# Patient Record
Sex: Female | Born: 1995 | Race: White | Hispanic: No | Marital: Married | Smoking: Former smoker
Health system: Southern US, Community
[De-identification: ages and names within clinical notes are randomized; demographics above are authoritative.]

## PROBLEM LIST (undated history)

## (undated) ENCOUNTER — Inpatient Hospital Stay (HOSPITAL_COMMUNITY): Payer: Self-pay

## (undated) DIAGNOSIS — F419 Anxiety disorder, unspecified: Secondary | ICD-10-CM

## (undated) HISTORY — PX: HYMENECTOMY: SHX987

---
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
## 2015-08-11 DIAGNOSIS — F419 Anxiety disorder, unspecified: Secondary | ICD-10-CM

## 2015-08-11 HISTORY — DX: Anxiety disorder, unspecified: F41.9

## 2017-05-06 ENCOUNTER — Ambulatory Visit (HOSPITAL_COMMUNITY): Payer: Self-pay | Admitting: Psychiatry
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
## 2017-05-17 ENCOUNTER — Ambulatory Visit (INDEPENDENT_AMBULATORY_CARE_PROVIDER_SITE_OTHER): Payer: Self-pay | Admitting: Psychiatry

## 2017-05-17 ENCOUNTER — Encounter (HOSPITAL_COMMUNITY): Payer: Self-pay | Admitting: Psychiatry

## 2017-05-17 ENCOUNTER — Encounter (INDEPENDENT_AMBULATORY_CARE_PROVIDER_SITE_OTHER): Payer: Self-pay

## 2017-05-17 VITALS — BP 125/71 | HR 100 | Ht 63.0 in | Wt 121.0 lb

## 2017-05-17 DIAGNOSIS — F419 Anxiety disorder, unspecified: Secondary | ICD-10-CM

## 2017-05-17 DIAGNOSIS — G4700 Insomnia, unspecified: Secondary | ICD-10-CM

## 2017-05-17 DIAGNOSIS — F411 Generalized anxiety disorder: Secondary | ICD-10-CM | POA: Insufficient documentation

## 2017-05-17 DIAGNOSIS — Z91410 Personal history of adult physical and sexual abuse: Secondary | ICD-10-CM

## 2017-05-17 DIAGNOSIS — Z818 Family history of other mental and behavioral disorders: Secondary | ICD-10-CM

## 2017-05-17 DIAGNOSIS — Z87891 Personal history of nicotine dependence: Secondary | ICD-10-CM

## 2017-05-17 DIAGNOSIS — F4310 Post-traumatic stress disorder, unspecified: Secondary | ICD-10-CM

## 2017-05-17 DIAGNOSIS — Z91411 Personal history of adult psychological abuse: Secondary | ICD-10-CM

## 2017-05-17 MED ORDER — LORAZEPAM 0.5 MG PO TABS: 0.5000 mg | ORAL_TABLET | Freq: Every day | ORAL | 0 refills | Status: DC | PRN

## 2017-05-17 MED ORDER — FLUOXETINE HCL 60 MG PO TABS: ORAL_TABLET | ORAL | 1 refills | Status: DC

## 2017-05-17 NOTE — Patient Instructions (Signed)
1. Discontinue sertraline, buspar 2. Start fluoxetine 30 mg daily for two weeks, then 60 mg daily  3. Return to clinic in one month for 30 mins 4. Referral to therapy

## 2017-06-03 ENCOUNTER — Ambulatory Visit (HOSPITAL_COMMUNITY): Payer: Self-pay | Admitting: Licensed Clinical Social Worker
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
## 2017-06-17 ENCOUNTER — Ambulatory Visit (HOSPITAL_COMMUNITY): Payer: Self-pay | Admitting: Psychiatry

## 2017-06-18 ENCOUNTER — Ambulatory Visit (INDEPENDENT_AMBULATORY_CARE_PROVIDER_SITE_OTHER): Payer: Self-pay | Admitting: Psychiatry

## 2017-06-18 ENCOUNTER — Encounter (HOSPITAL_COMMUNITY): Payer: Self-pay | Admitting: Psychiatry

## 2017-06-18 VITALS — BP 101/72 | HR 98 | Ht 63.0 in | Wt 116.0 lb

## 2017-06-18 DIAGNOSIS — Z87891 Personal history of nicotine dependence: Secondary | ICD-10-CM

## 2017-06-18 DIAGNOSIS — F4310 Post-traumatic stress disorder, unspecified: Secondary | ICD-10-CM

## 2017-06-18 MED ORDER — FLUOXETINE HCL 40 MG PO CAPS: 40.0000 mg | ORAL_CAPSULE | Freq: Every day | ORAL | 0 refills | Status: DC

## 2017-06-18 MED ORDER — CLONAZEPAM 0.5 MG PO TABS: 0.5000 mg | ORAL_TABLET | Freq: Every day | ORAL | 0 refills | Status: DC | PRN

## 2017-06-18 NOTE — Patient Instructions (Signed)
1. Increase fluoxetine 40 mg daily  2. Discontinue ativan  3. Start clonazepam 0.5 mg daily as needed for anxiety  4. Return to clinic in one month for 30 mins 5. Referral to therapy
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
## 2017-07-19 ENCOUNTER — Ambulatory Visit (HOSPITAL_COMMUNITY): Payer: Self-pay | Admitting: Psychiatry

## 2017-07-20 ENCOUNTER — Telehealth (HOSPITAL_COMMUNITY): Payer: Self-pay | Admitting: *Deleted

## 2017-07-20 NOTE — Telephone Encounter (Signed)
left voice message regarding appointment missed due to weather. 

## 2018-08-25 LAB — CHG URINE PREGNANCY TEST: PREG TEST UR: POSITIVE

## 2018-09-19 DIAGNOSIS — Z3A09 9 weeks gestation of pregnancy: Secondary | ICD-10-CM | POA: Diagnosis not present

## 2018-09-19 DIAGNOSIS — O219 Vomiting of pregnancy, unspecified: Secondary | ICD-10-CM | POA: Diagnosis not present

## 2018-09-19 DIAGNOSIS — E860 Dehydration: Secondary | ICD-10-CM | POA: Diagnosis not present

## 2018-09-19 DIAGNOSIS — O2391 Unspecified genitourinary tract infection in pregnancy, first trimester: Secondary | ICD-10-CM | POA: Diagnosis not present

## 2018-09-21 DIAGNOSIS — Z3481 Encounter for supervision of other normal pregnancy, first trimester: Secondary | ICD-10-CM | POA: Diagnosis not present

## 2018-09-30 ENCOUNTER — Telehealth: Payer: Self-pay | Admitting: Family Medicine

## 2018-09-30 NOTE — Telephone Encounter (Signed)
Attempted to call patient to get her rescheduled for her missed new ob intake on 2/21. No answer and could not leave a VM due to number not being in service.

## 2018-10-20 ENCOUNTER — Encounter: Payer: Self-pay | Admitting: *Deleted

## 2018-10-31 ENCOUNTER — Other Ambulatory Visit: Payer: Self-pay

## 2018-10-31 ENCOUNTER — Encounter: Payer: Self-pay | Admitting: *Deleted

## 2018-10-31 ENCOUNTER — Encounter (HOSPITAL_COMMUNITY): Payer: Self-pay | Admitting: *Deleted

## 2018-10-31 ENCOUNTER — Inpatient Hospital Stay (HOSPITAL_COMMUNITY): Payer: Medicaid Other

## 2018-10-31 ENCOUNTER — Inpatient Hospital Stay (HOSPITAL_COMMUNITY)
Admission: EM | Admit: 2018-10-31 | Discharge: 2018-10-31 | Disposition: A | Discharge status: Home / Self Care | Payer: Medicaid Other | Attending: Obstetrics and Gynecology | Admitting: Obstetrics and Gynecology

## 2018-10-31 DIAGNOSIS — Z79899 Other long term (current) drug therapy: Secondary | ICD-10-CM | POA: Insufficient documentation

## 2018-10-31 DIAGNOSIS — N83201 Unspecified ovarian cyst, right side: Secondary | ICD-10-CM | POA: Insufficient documentation

## 2018-10-31 DIAGNOSIS — O3481 Maternal care for other abnormalities of pelvic organs, first trimester: Secondary | ICD-10-CM | POA: Diagnosis not present

## 2018-10-31 DIAGNOSIS — O99341 Other mental disorders complicating pregnancy, first trimester: Secondary | ICD-10-CM | POA: Diagnosis not present

## 2018-10-31 DIAGNOSIS — F419 Anxiety disorder, unspecified: Secondary | ICD-10-CM | POA: Diagnosis not present

## 2018-10-31 DIAGNOSIS — O4690 Antepartum hemorrhage, unspecified, unspecified trimester: Secondary | ICD-10-CM | POA: Diagnosis present

## 2018-10-31 DIAGNOSIS — Z87891 Personal history of nicotine dependence: Secondary | ICD-10-CM | POA: Diagnosis not present

## 2018-10-31 DIAGNOSIS — O4691 Antepartum hemorrhage, unspecified, first trimester: Secondary | ICD-10-CM | POA: Insufficient documentation

## 2018-10-31 DIAGNOSIS — Z881 Allergy status to other antibiotic agents status: Secondary | ICD-10-CM | POA: Diagnosis not present

## 2018-10-31 DIAGNOSIS — O209 Hemorrhage in early pregnancy, unspecified: Secondary | ICD-10-CM | POA: Diagnosis not present

## 2018-10-31 LAB — CBC
HCT: 40.2 % (ref 36.0–46.0)
Hemoglobin: 12.8 g/dL (ref 12.0–15.0)
MCH: 29.7 pg (ref 26.0–34.0)
MCHC: 31.8 g/dL (ref 30.0–36.0)
MCV: 93.3 fL (ref 80.0–100.0)
Platelets: 255 10*3/uL (ref 150–400)
RBC: 4.31 MIL/uL (ref 3.87–5.11)
RDW: 12.4 % (ref 11.5–15.5)
WBC: 8.5 10*3/uL (ref 4.0–10.5)
nRBC: 0 % (ref 0.0–0.2)

## 2018-10-31 LAB — POCT PREGNANCY, URINE: Preg Test, Ur: NEGATIVE

## 2018-10-31 LAB — HCG, QUANTITATIVE, PREGNANCY: hCG, Beta Chain, Quant, S: 58 m[IU]/mL — ABNORMAL HIGH (ref <5)

## 2018-10-31 LAB — WET PREP, GENITAL
SPERM: NONE SEEN
Trich, Wet Prep: NONE SEEN
Yeast Wet Prep HPF POC: NONE SEEN

## 2018-10-31 NOTE — Discharge Instructions (Signed)
Ovarian Cyst     An ovarian cyst is a fluid-filled sac that forms on an ovary. The ovaries are small organs that produce eggs in women. Various types of cysts can form on the ovaries. Some may cause symptoms and require treatment. Most ovarian cysts go away on their own, are not cancerous (are benign), and do not cause problems. Common types of ovarian cysts include:  Functional (follicle) cysts. ? Occur during the menstrual cycle, and usually go away with the next menstrual cycle if you do not get pregnant. ? Usually cause no symptoms.  Endometriomas. ? Are cysts that form from the tissue that lines the uterus (endometrium). ? Are sometimes called chocolate cysts because they become filled with blood that turns brown. ? Can cause pain in the lower abdomen during intercourse and during your period.  Cystadenoma cysts. ? Develop from cells on the outside surface of the ovary. ? Can get very large and cause lower abdomen pain and pain with intercourse. ? Can cause severe pain if they twist or break open (rupture).  Dermoid cysts. ? Are sometimes found in both ovaries. ? May contain different kinds of body tissue, such as skin, teeth, hair, or cartilage. ? Usually do not cause symptoms unless they get very big.  Theca lutein cysts. ? Occur when too much of a certain hormone (human chorionic gonadotropin) is produced and overstimulates the ovaries to produce an egg. ? Are most common after having procedures used to assist with the conception of a baby (in vitro fertilization). What are the causes? Ovarian cysts may be caused by:  Ovarian hyperstimulation syndrome. This is a condition that can develop from taking fertility medicines. It causes multiple large ovarian cysts to form.  Polycystic ovarian syndrome (PCOS). This is a common hormonal disorder that can cause ovarian cysts, as well as problems with your period or fertility. What increases the risk? The following factors may  make you more likely to develop ovarian cysts:  Being overweight or obese.  Taking fertility medicines.  Taking certain forms of hormonal birth control.  Smoking. What are the signs or symptoms? Many ovarian cysts do not cause symptoms. If symptoms are present, they may include:  Pelvic pain or pressure.  Pain in the lower abdomen.  Pain during sex.  Abdominal swelling.  Abnormal menstrual periods.  Increasing pain with menstrual periods. How is this diagnosed? These cysts are commonly found during a routine pelvic exam. You may have tests to find out more about the cyst, such as:  Ultrasound.  X-ray of the pelvis.  CT scan.  MRI.  Blood tests. How is this treated? Many ovarian cysts go away on their own without treatment. Your health care provider may want to check your cyst regularly for 2-3 months to see if it changes. If you are in menopause, it is especially important to have your cyst monitored closely because menopausal women have a higher rate of ovarian cancer. When treatment is needed, it may include:  Medicines to help relieve pain.  A procedure to drain the cyst (aspiration).  Surgery to remove the whole cyst.  Hormone treatment or birth control pills. These methods are sometimes used to help dissolve a cyst. Follow these instructions at home:  Take over-the-counter and prescription medicines only as told by your health care provider.  Do not drive or use heavy machinery while taking prescription pain medicine.  Get regular pelvic exams and Pap tests as often as told by your health care provider.  Return to your normal activities as told by your health care provider. Ask your health care provider what activities are safe for you.  Do not use any products that contain nicotine or tobacco, such as cigarettes and e-cigarettes. If you need help quitting, ask your health care provider.  Keep all follow-up visits as told by your health care provider.  This is important. Contact a health care provider if:  Your periods are late, irregular, or painful, or they stop.  You have pelvic pain that does not go away.  You have pressure on your bladder or trouble emptying your bladder completely.  You have pain during sex.  You have any of the following in your abdomen: ? A feeling of fullness. ? Pressure. ? Discomfort. ? Pain that does not go away. ? Swelling.  You feel generally ill.  You become constipated.  You lose your appetite.  You develop severe acne.  You start to have more body hair and facial hair.  You are gaining weight or losing weight without changing your exercise and eating habits.  You think you may be pregnant. Get help right away if:  You have abdominal pain that is severe or gets worse.  You cannot eat or drink without vomiting.  You suddenly develop a fever.  Your menstrual period is much heavier than usual. This information is not intended to replace advice given to you by your health care provider. Make sure you discuss any questions you have with your health care provider. Document Released: 07/27/2005 Document Revised: 02/14/2016 Document Reviewed: 12/29/2015 Elsevier Interactive Patient Education  2019 [employer]. Vaginal Bleeding During Pregnancy, First Trimester  A small amount of bleeding (spotting) from the vagina is common during early pregnancy. Sometimes the bleeding is normal and does not cause problems. At other times, though, bleeding may be a sign of something serious. Tell your doctor about any bleeding from your vagina right away. Follow these instructions at home: Activity  Follow your doctor's instructions about how active you can be.  If needed, make plans for someone to help with your normal activities.  Do not have sex or orgasms until your doctor says that this is safe. General instructions  Take over-the-counter and prescription medicines only as told by your  doctor.  Watch your condition for any changes.  Write down: ? The number of pads you use each day. ? How often you change pads. ? How soaked (saturated) your pads are.  Do not use tampons.  Do not douche.  If you pass any tissue from your vagina, save it to show to your doctor.  Keep all follow-up visits as told by your doctor. This is important. Contact a doctor if:  You have vaginal bleeding at any time while you are pregnant.  You have cramps.  You have a fever. Get help right away if:  You have very bad cramps in your back or belly (abdomen).  You pass large clots or a lot of tissue from your vagina.  Your bleeding gets worse.  You feel light-headed.  You feel weak.  You pass out (faint).  You have chills.  You are leaking fluid from your vagina.  You have a gush of fluid from your vagina. Summary  Sometimes vaginal bleeding during pregnancy is normal and does not cause problems. At other times, bleeding may be a sign of something serious.  Tell your doctor about any bleeding from your vagina right away.  Follow your doctor's instructions about how active  you can be. You may need someone to help you with your normal activities. This information is not intended to replace advice given to you by your health care provider. Make sure you discuss any questions you have with your health care provider. Document Released: 12/11/2013 Document Revised: 10/28/2016 Document Reviewed: 10/28/2016 Elsevier Interactive Patient Education  2019 [employer].

## 2018-10-31 NOTE — MAU Provider Note (Signed)
History     CSN: [id number]  Arrival date and time: 10/31/18 1430   First Provider Initiated Contact with Patient 10/31/18 1724      Chief Complaint  Patient presents with  . Vaginal Bleeding  . Abdominal Pain   Ms. Julie Wilkinson is a 23 y.o. G3P1011 at Unknown [place] who presents to MAU for vaginal bleeding which began 1300 today. Pt reports the bleeding was very heavy for about at that time with large clots, but slowed down quickly after that. Pt denies any vaginal bleeding while waiting in MAU. Pt reports sharp, intermittent cramping with heavy bleeding and clotting. Pt denies any pelvic pain at this time.  Of note, pt reports an 11w surgical TAB on 10/06/2018. Pt reports she went back to her provider for f/u on 10/20/2018 and was told she had a new pregnancy and "they were able to see something on [place]" but the pt could not specify what was seen. Pt reports having unprotected intercourse with her husband beginning two days after her TAB. Pt reports multiple +UPTs at home. In MAU, UPT negative, hCG drawn with level of 58.  Blood soaking clothes? no Lightheaded/dizzy? no Passed any tissue? unsure Hx ectopic pregnancy? no Hx of PID, GYN surgery? no Current IUD? no  Blood Type? unknown Allergies? NKDA Current medications? no Current [facility] & next appt? [facility], next appt 11/03/2018  Pt's husband present for entire visit.    OB History    Gravida  3   Para  1   Term  1   Preterm      AB  1   Living  1     SAB      TAB  1   Ectopic      Multiple      Live Births  1           History reviewed. No pertinent past medical history.  History reviewed. No pertinent surgical history.  Family History  Problem Relation Age of Onset  . Bipolar disorder Mother   . Depression Father   . Bipolar disorder Sister     Social History   Tobacco Use  . Smoking status: Former Games developer  . Smokeless tobacco: Never Used  Substance Use Topics  .  Alcohol use: Yes    Comment: 05-17-2017 per pt once a week  . Drug use: No    Allergies:  Allergies  Allergen Reactions  . Loracarbef     No medications prior to admission.    Review of Systems  Constitutional: Negative for chills, diaphoresis, fatigue and fever.  Respiratory: Negative for shortness of breath.   Cardiovascular: Negative for chest pain.  Gastrointestinal: Negative for abdominal pain, constipation, diarrhea, nausea and vomiting.  Genitourinary: Positive for pelvic pain and vaginal bleeding. Negative for dysuria, frequency and vaginal discharge.  Neurological: Negative for dizziness, weakness, light-headedness and headaches.   Physical Exam   Blood pressure (!) 111/59, pulse 84, temperature 98.4 F (36.9 C), resp. rate 18, height 5\' 4"  (1.626 m), weight 60.8 kg.   Patient Vitals for the past 24 hrs:  BP Temp Pulse Resp Height Weight  10/31/18 1934 (!) 111/59 - 84 18 - -  10/31/18 1519 119/64 98.4 F (36.9 C) 100 16 5\' 4"  (1.626 m) 60.8 kg     Physical Exam  Constitutional: She is oriented to person, place, and time. She appears well-developed and well-nourished. No distress.  HENT:  Head: Normocephalic.  Respiratory: Effort normal.  GI:  Soft. She exhibits no distension and no mass. There is no abdominal tenderness. There is no rebound and no guarding.  Genitourinary: There is no rash, tenderness, lesion or injury on the right labia. There is no rash, tenderness, lesion or injury on the left labia. Uterus is not enlarged and not tender. Cervix exhibits no motion tenderness, no discharge and no friability. Right adnexum displays no mass, no tenderness and no fullness. Left adnexum displays no mass, no tenderness and no fullness.    Vaginal bleeding present.     No vaginal discharge or tenderness.  There is bleeding in the vagina. No tenderness in the vagina.  Neurological: She is alert and oriented to person, place, and time.  Skin: Skin is warm and dry. She is  not diaphoretic.  Psychiatric: She has a normal mood and affect. Her behavior is normal.   Results for orders placed or performed during the hospital encounter of 10/31/18 (from the past 24 hour(s))  Pregnancy, urine POC     Status: None   Collection Time: 10/31/18  3:20 PM  Result Value Ref Range   Preg Test, Ur NEGATIVE NEGATIVE  hCG, quantitative, pregnancy     Status: Abnormal   Collection Time: 10/31/18  3:32 PM  Result Value Ref Range   hCG, Beta Chain, Quant, S 58 (H) <5 mIU/mL  Wet prep, genital     Status: Abnormal   Collection Time: 10/31/18  5:47 PM  Result Value Ref Range   Yeast Wet Prep HPF POC NONE SEEN NONE SEEN   Trich, Wet Prep NONE SEEN NONE SEEN   Clue Cells Wet Prep HPF POC PRESENT (A) NONE SEEN   WBC, Wet Prep HPF POC MODERATE (A) NONE SEEN   Sperm NONE SEEN   CBC     Status: None   Collection Time: 10/31/18  5:53 PM  Result Value Ref Range   WBC 8.5 4.0 - 10.5 K/uL   RBC 4.31 3.87 - 5.11 MIL/uL   Hemoglobin 12.8 12.0 - 15.0 g/dL   HCT 09.840.2 11.936.0 - 14.746.0 %   MCV 93.3 80.0 - 100.0 fL   MCH 29.7 26.0 - 34.0 pg   MCHC 31.8 30.0 - 36.0 g/dL   RDW 82.912.4 56.211.5 - 13.015.5 %   Platelets 255 150 - 400 K/uL   nRBC 0.0 0.0 - 0.2 %  ABO/Rh     Status: None   Collection Time: 10/31/18  6:34 PM  Result Value Ref Range   ABO/RH(D)      O POS Performed at [facility], [address] [place], [place] [address]     [place] Ob Less Than 14 Weeks With Ob Transvaginal  Result Date: 10/31/2018 CLINICAL DATA:  Vaginal bleeding and cramping. Recent therapeutic abortion. Positive pregnancy test. EXAM: OBSTETRIC <14 WK US AND TRANSVAGINAL OB US TECHNIQUE: Both transabdominal and transvaginal ultrasound examinations were performed for complete evaluation of the gestation as well as the maternal uterus, adnexal regions, and pelvic cul-de-sac. Transvaginal technique was performed to assess early pregnancy. COMPARISON:  None. FINDINGS: Intrauterine gestational sac: None Maternal  uterus/adnexae: Abnormal soft tissue density with multiple small cystic foci are seen within the endometrial cavity. Differential diagnosis includes retained products of conception and gestational trophoblastic neoplasia. No fibroids identified. A mildly complex cyst with a few thin septations is seen in the right ovary measuring 3.0 cm. The left ovary normal appearance. No suspicious adnexal mass or abnormal free fluid identified. IMPRESSION: Abnormal soft tissue density with multiple cystic  foci throughout the endometrial cavity. Differential diagnosis includes retained products of conception and gestational trophoblastic neoplasia. 3 cm mildly complex right ovarian cystic lesion, which has probably benign characteristics. Recommend continued follow-up by ultrasound in 6-12 weeks. Electronically Signed   By: Myles Rosenthal M.D.   On: 10/31/2018 18:27     MAU Course  Procedures  MDM -r/o ectopic vs. early pregnancy vs. miscarriage vs. retained POCs -UPT negative -hCG: 58 -UA: results pending at time of discharge -WetPrep: +ClueCells (isolated finding, no treatment needed) -CBC: WNL (h/H 12.8/40.2) -ABO: O positive -[place]: DDx retained POCs vs. GTN, 3cm mildly complex right ovarian cystic lesion needing f/u in 6-12weeks -pt discharged to home in stable condition with husband  Assessment and Plan   1. Vaginal bleeding in pregnancy   2. Cyst of right ovary    Allergies as of 10/31/2018      Reactions   Loracarbef       Medication List    STOP taking these medications   ondansetron 4 MG tablet Commonly known as:  ZOFRAN     TAKE these medications   clonazePAM 0.5 MG tablet Commonly known as:  KLONOPIN Take 1 tablet (0.5 mg total) daily as needed by mouth for anxiety.   FLUoxetine 40 MG capsule Commonly known as:  PROZAC Take 1 capsule (40 mg total) daily by mouth.      -will call with culture results, if positive -f/u in clinic on Thursday AM for repeat hCG, appt scheduled at [facility]  for 11/03/2018 @1330  -strict ectopic/bleeding/infection/miscarriage/return MAU precautions given -pt aware needs to have f/u US for right ovarian cyst in 6-12weeks -pt and husband questions asked and answered, extensive discussion about possible outcomes/treatments given -pt discharged to home in stable condition with husband  Odie Sera  10/31/2018, 7:42 PM

## 2018-10-31 NOTE — MAU Note (Signed)
Pt presents to MAU with complaints of vaginal bleeding and lower abdominal cramping for a couple of hours. Pt states she had a TAB on 10/06/18 and went back for follow up three weeks later on March the 12th and was told she had a new pregnancy.Marland Kitchen Pt plans to keep this pregnancy

## 2018-11-01 LAB — GC/CHLAMYDIA PROBE AMP (~~LOC~~) NOT AT ARMC
Chlamydia: NEGATIVE
Neisseria Gonorrhea: NEGATIVE

## 2018-11-01 LAB — ABO/RH: ABO/RH(D): O POS

## 2018-11-03 ENCOUNTER — Ambulatory Visit (INDEPENDENT_AMBULATORY_CARE_PROVIDER_SITE_OTHER): Payer: Medicaid Other | Admitting: *Deleted

## 2018-11-03 ENCOUNTER — Other Ambulatory Visit: Payer: Self-pay

## 2018-11-03 VITALS — Temp 98.7°F

## 2018-11-03 DIAGNOSIS — O3680X0 Pregnancy with inconclusive fetal viability, not applicable or unspecified: Secondary | ICD-10-CM

## 2018-11-03 LAB — BETA HCG QUANT (REF LAB): hCG Quant: 41 m[IU]/mL

## 2018-11-03 NOTE — Progress Notes (Signed)
I have reviewed this chart and agree with the RN/CMA assessment and management.    K. Meryl Davis, M.D. Attending [facility] (Faculty Practice)   

## 2018-11-03 NOTE — Progress Notes (Signed)
Here for stat bhcg. Denies pain; but c/o mild cramps= 1 or 2 which is much less than when she went to MAU.  Denies bleeding. Explained we will draw stat bhcg and have her wait in her car  for results  Due to coronavirus. which we will then discuss with provider and then her. She voices understanding.  Kyrstyn Greear,RN  Reviewed with Dr. Earlene Plater- ordered repeat non stat bhcg and follow up ultrasound on same day with provider in 7-10 days .Ectopic precautions. Reviewed results and recommendations with Nazly,.Advised bhcg is dropping and will need to be checked every 1-2 weeks until normal ( less than 5).  Advised no intercourse or must use condoms. She voices understanding.  Keon Benscoter,RN

## 2018-11-04 ENCOUNTER — Telehealth: Payer: Self-pay

## 2018-11-04 NOTE — Telephone Encounter (Signed)
Called pt to advise of appropriate plan of care that was discussed w/ Dr. Earlene Plater on 10/2618. If pt has severe Pain,unusual bleeding, go back to MAU.Pt has to give time for levels to drop. No answer, mailbox full, will send message in My Chart.

## 2018-11-04 NOTE — Telephone Encounter (Signed)
Pt called Nurse Line on 11/03/18 @ 4:58p, stating that she does not feel compfortable waiting until  April to get another [place] based on retained products after abortion. Pt also was in ED a couple of days ago. Was advised by Dr. Earlene Plater during Nurse visit for STAT Beta on 11/03/18 with Raynald Blend, RN  Reviewed with Dr. Earlene Plater- ordered repeat non stat bhcg and follow up ultrasound on same day with provider in 7-10 days .Ectopic precautions. Reviewed results and recommendations with Aritha,.Advised bhcg is dropping and will need to be checked every 1-2 weeks until normal ( less than 5).

## 2018-11-07 ENCOUNTER — Telehealth: Payer: Self-pay | Admitting: *Deleted

## 2018-11-07 NOTE — Telephone Encounter (Signed)
Breeona called back again and call transferred to nurse and she states she doesn't feel comfortable waiting until her appt in one week.  I reviewed her chart and informed her that Dr.Davis feels that her plan of care is appropriate unless she is having other symptoms. She denies fever, severe pain or  Heavy bleeding at present. I advised her if she starts having these symptoms to go to the hospital for evaluation . I also informed her that if at her next appt her evaluation ( bhcg, [place], exam) shows she needs treatment - that will get treatments. She voices understanding.

## 2018-11-07 NOTE — Telephone Encounter (Signed)
Julie Wilkinson called 11/04/18 11;14 am and left message she missed our call back yesterday from the message she left. Please call back.

## 2018-11-07 NOTE — Telephone Encounter (Signed)
I called Julie Wilkinson and left a message I am returning her call and we seem to keep missing her when we call back, We have sent MyChart message- please let us know if you still have a question or need after you read your Mychart messages.

## 2018-11-11 ENCOUNTER — Other Ambulatory Visit: Payer: Self-pay | Admitting: *Deleted

## 2018-11-11 DIAGNOSIS — O3680X0 Pregnancy with inconclusive fetal viability, not applicable or unspecified: Secondary | ICD-10-CM

## 2018-11-14 ENCOUNTER — Ambulatory Visit (HOSPITAL_COMMUNITY)
Admission: RE | Admit: 2018-11-14 | Discharge: 2018-11-14 | Disposition: A | Discharge status: Home / Self Care | Payer: Medicaid Other | Source: Ambulatory Visit | Attending: Obstetrics and Gynecology | Admitting: Obstetrics and Gynecology

## 2018-11-14 ENCOUNTER — Other Ambulatory Visit: Payer: Self-pay

## 2018-11-14 ENCOUNTER — Other Ambulatory Visit: Payer: Medicaid Other

## 2018-11-14 ENCOUNTER — Ambulatory Visit: Payer: Medicaid Other

## 2018-11-14 ENCOUNTER — Ambulatory Visit (INDEPENDENT_AMBULATORY_CARE_PROVIDER_SITE_OTHER): Payer: Medicaid Other | Admitting: Obstetrics & Gynecology

## 2018-11-14 ENCOUNTER — Ambulatory Visit (HOSPITAL_COMMUNITY): Admission: RE | Admit: 2018-11-14 | Payer: Medicaid Other | Source: Ambulatory Visit

## 2018-11-14 DIAGNOSIS — O3680X0 Pregnancy with inconclusive fetal viability, not applicable or unspecified: Secondary | ICD-10-CM | POA: Diagnosis not present

## 2018-11-14 DIAGNOSIS — R9389 Abnormal findings on diagnostic imaging of other specified body structures: Secondary | ICD-10-CM

## 2018-11-14 NOTE — Progress Notes (Signed)
   Subjective:    Patient ID: Julie Wilkinson, female    DOB: [birth year], 22 y.o.   MRN: [id number]  HPI 23 yo G3P1011 here for follow up after having a MAU visit on 10-31-18. The history is somewhat convoluted but basically, she had an elective surgical abortion on 10-06-18 and then decided that she would be okay with another pregnancy so she did not use contraception. She went back to her AB provider on 10-20-18 and was told that she was pregnant and they could "see something on u/s". She had a QBHCG of 41 on 11-03-18 and had a non-stat one drawn here at 4 pm. She had a follow up u/s today also but the results are not available.   She had an u/s done on 11-14-18 that had findings concerning for retained POC versus trophoblastic disease.  She denies any problems.  Review of Systems     Objective:   Physical Exam Breathing, conversing, and ambulating normally Well nourished, well hydrated White female, no apparent distress     Assessment & Plan:  Abnormal ultrasound findings after a TAB- await QBHCG and u/s report I told her that I will call her tomorrow with the results. I also discussed the possibility of a d&c in the future

## 2018-11-15 LAB — BETA HCG QUANT (REF LAB): hCG Quant: 6 m[IU]/mL

## 2018-11-17 ENCOUNTER — Other Ambulatory Visit: Payer: Self-pay | Admitting: Obstetrics & Gynecology

## 2018-11-17 MED ORDER — OXYCODONE-ACETAMINOPHEN 5-325 MG PO TABS: 1.0000 | ORAL_TABLET | ORAL | 0 refills | Status: DC | PRN

## 2018-11-17 MED ORDER — MISOPROSTOL 200 MCG PO TABS: ORAL_TABLET | ORAL | 0 refills | Status: DC

## 2018-11-17 MED ORDER — OXYCODONE-ACETAMINOPHEN 5-325 MG PO TABS: ORAL_TABLET | ORAL | 0 refills | Status: DC

## 2018-11-17 MED ORDER — PROMETHAZINE HCL 25 MG PO TABS: 25.0000 mg | ORAL_TABLET | Freq: Four times a day (QID) | ORAL | 2 refills | Status: DC | PRN

## 2018-11-17 NOTE — Progress Notes (Unsigned)
Cytotec, phenergan, and percocet prescribed for retained products of conception. I left her a voicemail.

## 2018-11-21 ENCOUNTER — Telehealth: Payer: Self-pay | Admitting: *Deleted

## 2018-11-21 NOTE — Telephone Encounter (Signed)
I called Julie Wilkinson and left a message I am returning her call and I apologize for not calling back sooner as our office moved and was closed part of last week. Please call our office back.  (  Need to discuss with her if she took the cytotec Dr. Marice Potter ordered- she sent her a MyChart message )

## 2018-11-21 NOTE — Telephone Encounter (Signed)
Julie Wilkinson called 11/17/18 twice ( while office was closed for move) and said she had Korea and lab on 11/16/18 and and was told would get results Tuesday.

## 2018-11-22 NOTE — Telephone Encounter (Addendum)
Pt left message @ 1025 today stating that she is returning a call from our office.   4/15 1100 Called pt and informed her of [place] results as well as Dr. Ellin Saba MyChart message and medication sent to her pharmacy. Pt stated she did not receive the MyChart message and thinks she may have an old email address associated with the account. She did receive notification of the Rx from her pharmacy and stated that she had obtained Cytotec. She administered the Cytotec vaginally on 4/10. At the time she was already having some bleeding off and on which was not heavy. She continued to bleed for 1.5 days. No clots or tissue was passed and she did not have abdominal cramps. Currently all bleeding has stopped and she is not having any pain. I advised pt that I will provide this information to Dr. Marice Potter and let her know if there are any further recommendations or follow up needed. Pt voiced understanding. She was also provided with the telephone number for MyChart Help desk so that she can get the correct email associated with the account.

## 2018-11-23 ENCOUNTER — Other Ambulatory Visit: Payer: Self-pay | Admitting: *Deleted

## 2018-11-23 MED ORDER — METRONIDAZOLE 500 MG PO TABS: 500.0000 mg | ORAL_TABLET | Freq: Two times a day (BID) | ORAL | 0 refills | Status: DC

## 2018-11-23 NOTE — Addendum Note (Signed)
Addended by: Jill Side on: 11/23/2018 01:11 PM   Modules accepted: Orders

## 2018-11-28 ENCOUNTER — Other Ambulatory Visit: Payer: Medicaid Other

## 2018-11-28 ENCOUNTER — Other Ambulatory Visit: Payer: Self-pay | Admitting: *Deleted

## 2018-11-28 ENCOUNTER — Other Ambulatory Visit: Payer: Self-pay

## 2018-11-28 DIAGNOSIS — O3680X0 Pregnancy with inconclusive fetal viability, not applicable or unspecified: Secondary | ICD-10-CM

## 2018-11-29 ENCOUNTER — Other Ambulatory Visit: Payer: Self-pay | Admitting: Obstetrics & Gynecology

## 2018-11-29 DIAGNOSIS — R9389 Abnormal findings on diagnostic imaging of other specified body structures: Secondary | ICD-10-CM

## 2018-11-29 DIAGNOSIS — O4690 Antepartum hemorrhage, unspecified, unspecified trimester: Secondary | ICD-10-CM

## 2018-11-29 LAB — BETA HCG QUANT (REF LAB): hCG Quant: 2 m[IU]/mL

## 2018-11-29 NOTE — Progress Notes (Signed)
QBHCG ordered 

## 2018-12-02 ENCOUNTER — Emergency Department (HOSPITAL_COMMUNITY): Payer: Medicaid Other

## 2018-12-02 ENCOUNTER — Other Ambulatory Visit: Payer: Self-pay

## 2018-12-02 ENCOUNTER — Emergency Department (HOSPITAL_COMMUNITY)
Admission: EM | Admit: 2018-12-02 | Discharge: 2018-12-02 | Disposition: A | Discharge status: Home / Self Care | Payer: Medicaid Other | Attending: Emergency Medicine | Admitting: Emergency Medicine

## 2018-12-02 ENCOUNTER — Encounter (HOSPITAL_COMMUNITY): Payer: Self-pay

## 2018-12-02 DIAGNOSIS — R10815 Periumbilic abdominal tenderness: Secondary | ICD-10-CM | POA: Diagnosis not present

## 2018-12-02 DIAGNOSIS — Z79899 Other long term (current) drug therapy: Secondary | ICD-10-CM | POA: Diagnosis not present

## 2018-12-02 DIAGNOSIS — N939 Abnormal uterine and vaginal bleeding, unspecified: Secondary | ICD-10-CM | POA: Diagnosis not present

## 2018-12-02 DIAGNOSIS — Z87891 Personal history of nicotine dependence: Secondary | ICD-10-CM | POA: Diagnosis not present

## 2018-12-02 DIAGNOSIS — R10813 Right lower quadrant abdominal tenderness: Secondary | ICD-10-CM | POA: Diagnosis not present

## 2018-12-02 DIAGNOSIS — N938 Other specified abnormal uterine and vaginal bleeding: Secondary | ICD-10-CM | POA: Diagnosis not present

## 2018-12-02 DIAGNOSIS — Z9889 Other specified postprocedural states: Secondary | ICD-10-CM | POA: Insufficient documentation

## 2018-12-02 DIAGNOSIS — R10814 Left lower quadrant abdominal tenderness: Secondary | ICD-10-CM | POA: Diagnosis not present

## 2018-12-02 LAB — URINALYSIS, ROUTINE W REFLEX MICROSCOPIC
Bilirubin Urine: NEGATIVE
Glucose, UA: NEGATIVE mg/dL
Ketones, ur: NEGATIVE mg/dL
Leukocytes,Ua: NEGATIVE
Nitrite: NEGATIVE
Protein, ur: NEGATIVE mg/dL
Specific Gravity, Urine: 1.002 — ABNORMAL LOW (ref 1.005–1.030)
pH: 6 (ref 5.0–8.0)

## 2018-12-02 LAB — CBC WITH DIFFERENTIAL/PLATELET
Abs Immature Granulocytes: 0.03 10*3/uL (ref 0.00–0.07)
Basophils Absolute: 0 10*3/uL (ref 0.0–0.1)
Basophils Relative: 1 %
Eosinophils Absolute: 0.1 10*3/uL (ref 0.0–0.5)
Eosinophils Relative: 1 %
HCT: 39.2 % (ref 36.0–46.0)
Hemoglobin: 12.6 g/dL (ref 12.0–15.0)
Immature Granulocytes: 1 %
Lymphocytes Relative: 25 %
Lymphs Abs: 1.5 10*3/uL (ref 0.7–4.0)
MCH: 30.8 pg (ref 26.0–34.0)
MCHC: 32.1 g/dL (ref 30.0–36.0)
MCV: 95.8 fL (ref 80.0–100.0)
Monocytes Absolute: 0.5 10*3/uL (ref 0.1–1.0)
Monocytes Relative: 8 %
Neutro Abs: 3.7 10*3/uL (ref 1.7–7.7)
Neutrophils Relative %: 64 %
Platelets: 264 10*3/uL (ref 150–400)
RBC: 4.09 MIL/uL (ref 3.87–5.11)
RDW: 11.9 % (ref 11.5–15.5)
WBC: 5.7 10*3/uL (ref 4.0–10.5)
nRBC: 0 % (ref 0.0–0.2)

## 2018-12-02 LAB — BASIC METABOLIC PANEL
Anion gap: 9 (ref 5–15)
BUN: 7 mg/dL (ref 6–20)
CO2: 24 mmol/L (ref 22–32)
Calcium: 9.4 mg/dL (ref 8.9–10.3)
Chloride: 108 mmol/L (ref 98–111)
Creatinine, Ser: 0.67 mg/dL (ref 0.44–1.00)
GFR calc Af Amer: >60 mL/min (ref >60)
GFR calc non Af Amer: >60 mL/min (ref >60)
Glucose, Bld: 87 mg/dL (ref 70–99)
Potassium: 4.4 mmol/L (ref 3.5–5.1)
Sodium: 141 mmol/L (ref 135–145)

## 2018-12-02 LAB — HCG, QUANTITATIVE, PREGNANCY: hCG, Beta Chain, Quant, S: 2 m[IU]/mL (ref <5)

## 2018-12-02 LAB — WET PREP, GENITAL
Clue Cells Wet Prep HPF POC: NONE SEEN
Sperm: NONE SEEN
Trich, Wet Prep: NONE SEEN
Yeast Wet Prep HPF POC: NONE SEEN

## 2018-12-02 MED ORDER — SODIUM CHLORIDE 0.9 % IV BOLUS
1000.0000 mL | Freq: Once | INTRAVENOUS | Status: AC
  Administered 2018-12-02: 1000 mL via INTRAVENOUS

## 2018-12-02 NOTE — ED Notes (Signed)
Pelvic cart ready outside the door

## 2018-12-02 NOTE — Discharge Instructions (Addendum)
You can alternate 600 mg of ibuprofen and [phone] mg of Tylenol every 3 hours as needed for pain. Do not exceed 4000 mg of Tylenol daily.  Take ibuprofen with food to avoid upset stomach issues  Follow-up with your OB/GYN for reevaluation of symptoms on Monday if your symptoms persist.  Return to emergency department immediately if any concerning signs or symptoms develop such as severe fatigue, lightheadedness, passing out, severe abdominal pain, persistent vomiting, or worsening bleeding.

## 2018-12-02 NOTE — ED Triage Notes (Signed)
Pt from home; pt being treated for retained products of conception after abortion Feb 22nd; pt given Cytotec 2 weeks ago; pt endorses heavier than normal bleeding x 2 days; endorses abdominal cramping

## 2018-12-02 NOTE — ED Notes (Signed)
Spoke with ultrasound states will be sending for patient and have order changed.

## 2018-12-02 NOTE — ED Provider Notes (Addendum)
[facility] EMERGENCY DEPARTMENT Provider Note   CSN: [id number] Arrival date & time: 12/02/18  1209    History   Chief Complaint No chief complaint on file.   HPI Julie Wilkinson is a 23 y.o. female presenting for evaluation of acute onset, progressively worsening vaginal bleeding for 3 days.  She reports that she underwent a TAB on 10/01/2018 and states that she generally tolerated the procedure well.  Since then she has had intermittent spotting but no significant bleeding.  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  She was discharged with plan for repeat hCG and ultrasound which showed persistent retained products of conception versus GTN. She was prescribed Cytotec on 11/17/18 (2 weeks ago) but reports she did not have any significant bleeding until 3 days ago.  She reports that she has been soaking through tampons almost every 30 minutes but after switching to pads today she reports she has been through 2 pads in 3 hours.  She notes persistent lower abdominal tenderness and cramping which has been constant for the last month or so since her TAB.  Notes generalized weakness and mild fatigue but denies lightheadedness, syncope, fevers, vomiting, nausea, chest pain, or shortness of breath.  Also took some ibuprofen today which has been helpful with her pain. Also currently being treated for BV with a course of flagyl.      The history is provided by the patient.    History reviewed. No pertinent past medical history.  Patient Active Problem List   Diagnosis Date Noted   PTSD (post-traumatic stress disorder) 05/17/2017    No past surgical history on file.   OB History    Gravida  3   Para  1   Term  1   Preterm      AB  1   Living  1     SAB      TAB  1   Ectopic      Multiple      Live Births  1            Home Medications    Prior to  Admission medications   Medication Sig Start Date End Date Taking? Authorizing Provider  clonazePAM (KLONOPIN) 0.5 MG tablet Take 1 tablet (0.5 mg total) daily as needed by mouth for anxiety. 06/18/17   Neysa Hotter, MD  FLUoxetine (PROZAC) 40 MG capsule Take 1 capsule (40 mg total) daily by mouth. 06/18/17   Neysa Hotter, MD  metroNIDAZOLE (FLAGYL) 500 MG tablet Take 1 tablet (500 mg total) by mouth 2 (two) times daily. 11/23/18   Allie Bossier, MD  misoprostol (CYTOTEC) 200 MCG tablet Place 4 tablets vaginally 11/17/18   Allie Bossier, MD  oxyCODONE-acetaminophen (PERCOCET/ROXICET) 5-325 MG tablet Take 1 tablet by mouth every 4 (four) hours as needed. 11/17/18   Allie Bossier, MD  oxyCODONE-acetaminophen (PERCOCET/ROXICET) 5-325 MG tablet Take 1 pill po q 4 hour prn 11/17/18   Nicholaus Bloom C, MD  promethazine (PHENERGAN) 25 MG tablet Take 1 tablet (25 mg total) by mouth every 6 (six) hours as needed for nausea or vomiting. 11/17/18   Allie Bossier, MD    Family History Family History  Problem Relation Age of Onset   Bipolar disorder Mother    Depression Father    Bipolar disorder Sister     Social History Social History   Tobacco Use  Smoking status: Former Smoker   Smokeless tobacco: Never Used  Substance Use Topics   Alcohol use: Not Currently   Drug use: No     Allergies   Loracarbef   Review of Systems Review of Systems  Constitutional: Negative for chills and fever.  Respiratory: Negative for shortness of breath.   Cardiovascular: Negative for chest pain.  Gastrointestinal: Negative for abdominal pain, constipation, diarrhea and vomiting.  Genitourinary: Positive for vaginal bleeding.     Physical Exam Updated Vital Signs BP 100/66 (BP Location: Right Arm)    Pulse 70    Temp 98.6 F (37 C) (Oral)    Resp 12    Ht 5\' 4"  (1.626 m)    Wt 59 kg    SpO2 100%    Breastfeeding Unknown    BMI 22.31 kg/m   Physical Exam Vitals signs and nursing note reviewed. Exam  conducted with a chaperone present.  Constitutional:      General: She is not in acute distress.    Appearance: She is well-developed.     Comments: Resting comfortably in bed.   HENT:     Head: Normocephalic and atraumatic.  Eyes:     General:        Right eye: No discharge.        Left eye: No discharge.     Conjunctiva/sclera: Conjunctivae normal.  Neck:     Vascular: No JVD.     Trachea: No tracheal deviation.  Cardiovascular:     Rate and Rhythm: Normal rate and regular rhythm.  Pulmonary:     Effort: Pulmonary effort is normal.     Breath sounds: Normal breath sounds.  Abdominal:     General: Abdomen is flat. Bowel sounds are normal. There is no distension.     Palpations: Abdomen is soft.     Tenderness: There is abdominal tenderness in the right lower quadrant, suprapubic area and left lower quadrant. There is no right CVA tenderness, left CVA tenderness, guarding or rebound.     Comments: Mild lower abdominal discomfort.   Genitourinary:    General: Normal vulva.     Comments: Examination performed in the presence of a chaperone.  No masses or lesions to the external genitalia.  Copious blood in the vaginal vault, unable to visualize cervix as result.  No other abnormal discharge noted.  No cervical motion tenderness or adnexal tenderness. Skin:    General: Skin is warm and dry.     Findings: No erythema.  Neurological:     Mental Status: She is alert.  Psychiatric:        Behavior: Behavior normal.      ED Treatments / Results  Labs (all labs ordered are listed, but only abnormal results are displayed) Labs Reviewed  WET PREP, GENITAL - Abnormal; Notable for the following components:      Result Value   WBC, Wet Prep HPF POC MANY (*)    All other components within normal limits  URINALYSIS, ROUTINE W REFLEX MICROSCOPIC - Abnormal; Notable for the following components:   Color, Urine STRAW (*)    Specific Gravity, Urine 1.002 (*)    Hgb urine dipstick LARGE  (*)    Bacteria, UA RARE (*)    All other components within normal limits  BASIC METABOLIC PANEL  CBC WITH DIFFERENTIAL/PLATELET  HCG, QUANTITATIVE, PREGNANCY  GC/CHLAMYDIA PROBE AMP ([place]) NOT AT [facility]    EKG None  Radiology [place] Transvaginal Non-ob  Result Date: 12/02/2018 CLINICAL  DATA:  23 year old female with vaginal bleeding EXAM: TRANSABDOMINAL AND TRANSVAGINAL ULTRASOUND OF PELVIS DOPPLER ULTRASOUND OF OVARIES TECHNIQUE: Both transabdominal and transvaginal ultrasound examinations of the pelvis were performed. Transabdominal technique was performed for global imaging of the pelvis including uterus, ovaries, adnexal regions, and pelvic cul-de-sac. It was necessary to proceed with endovaginal exam following the transabdominal exam to visualize the endometrium. Color and duplex Doppler ultrasound was utilized to evaluate blood flow to the ovaries. COMPARISON:  Prior pelvic ultrasound 11/14/2018 and 10/31/2018 FINDINGS: Uterus Measurements: 9.2 x 4.8 x 7.1 cm = volume: 163 mL. No fibroids or other mass visualized. Endometrium Thickness: 7 mm.  No focal abnormality visualized. Right ovary Measurements: 3.3 x 1.4 x 2.4 cm = volume: 5.9 mL. Normal appearance/no adnexal mass. Left ovary Measurements: 3.1 x 1.7 x 2.5 cm = volume: 6.9 mL. Normal appearance/no adnexal mass. Pulsed Doppler evaluation of both ovaries demonstrates normal low-resistance arterial and venous waveforms. Other findings Small free fluid in the pelvis is likely physiologic. IMPRESSION: 1. Normal transabdominal and transvaginal ultrasound. 2. Interval resolution of irregular, thickened endometrium with multiple cystic foci. 3. Normal arterial and venous signal in both ovaries. Electronically Signed   By: Malachy Moan M.D.   On: 12/02/2018 15:28   US Pelvis Complete  Result Date: 12/02/2018 CLINICAL DATA:  23 year old female with vaginal bleeding EXAM: TRANSABDOMINAL AND TRANSVAGINAL ULTRASOUND OF PELVIS DOPPLER  ULTRASOUND OF OVARIES TECHNIQUE: Both transabdominal and transvaginal ultrasound examinations of the pelvis were performed. Transabdominal technique was performed for global imaging of the pelvis including uterus, ovaries, adnexal regions, and pelvic cul-de-sac. It was necessary to proceed with endovaginal exam following the transabdominal exam to visualize the endometrium. Color and duplex Doppler ultrasound was utilized to evaluate blood flow to the ovaries. COMPARISON:  Prior pelvic ultrasound 11/14/2018 and 10/31/2018 FINDINGS: Uterus Measurements: 9.2 x 4.8 x 7.1 cm = volume: 163 mL. No fibroids or other mass visualized. Endometrium Thickness: 7 mm.  No focal abnormality visualized. Right ovary Measurements: 3.3 x 1.4 x 2.4 cm = volume: 5.9 mL. Normal appearance/no adnexal mass. Left ovary Measurements: 3.1 x 1.7 x 2.5 cm = volume: 6.9 mL. Normal appearance/no adnexal mass. Pulsed Doppler evaluation of both ovaries demonstrates normal low-resistance arterial and venous waveforms. Other findings Small free fluid in the pelvis is likely physiologic. IMPRESSION: 1. Normal transabdominal and transvaginal ultrasound. 2. Interval resolution of irregular, thickened endometrium with multiple cystic foci. 3. Normal arterial and venous signal in both ovaries. Electronically Signed   By: Malachy Moan M.D.   On: 12/02/2018 15:28   [place] Art/ven Flow Abd Pelv Doppler  Result Date: 12/02/2018 CLINICAL DATA:  23 year old female with vaginal bleeding EXAM: TRANSABDOMINAL AND TRANSVAGINAL ULTRASOUND OF PELVIS DOPPLER ULTRASOUND OF OVARIES TECHNIQUE: Both transabdominal and transvaginal ultrasound examinations of the pelvis were performed. Transabdominal technique was performed for global imaging of the pelvis including uterus, ovaries, adnexal regions, and pelvic cul-de-sac. It was necessary to proceed with endovaginal exam following the transabdominal exam to visualize the endometrium. Color and duplex Doppler ultrasound  was utilized to evaluate blood flow to the ovaries. COMPARISON:  Prior pelvic ultrasound 11/14/2018 and 10/31/2018 FINDINGS: Uterus Measurements: 9.2 x 4.8 x 7.1 cm = volume: 163 mL. No fibroids or other mass visualized. Endometrium Thickness: 7 mm.  No focal abnormality visualized. Right ovary Measurements: 3.3 x 1.4 x 2.4 cm = volume: 5.9 mL. Normal appearance/no adnexal mass. Left ovary Measurements: 3.1 x 1.7 x 2.5 cm = volume: 6.9 mL. Normal appearance/no adnexal mass. Pulsed  Doppler evaluation of both ovaries demonstrates normal low-resistance arterial and venous waveforms. Other findings Small free fluid in the pelvis is likely physiologic. IMPRESSION: 1. Normal transabdominal and transvaginal ultrasound. 2. Interval resolution of irregular, thickened endometrium with multiple cystic foci. 3. Normal arterial and venous signal in both ovaries. Electronically Signed   By: Malachy Moan M.D.   On: 12/02/2018 15:28    Procedures Procedures (including critical care time)  Medications Ordered in ED Medications  sodium chloride 0.9 % bolus 1,000 mL (0 mLs Intravenous Stopped 12/02/18 1520)     Initial Impression / Assessment and Plan / ED Course  I have reviewed the triage vital signs and the nursing notes.  Pertinent labs & imaging results that were available during my care of the patient were reviewed by me and considered in my medical decision making (see chart for details).  Patient presenting with progressively worsening vaginal bleeding for the last 3 days.  Underwent TAB on 10/01/2018 with evidence of retained products of conception on subsequent ultrasound requiring Cytotec which she took 2 weeks ago.  Did not have any significant vaginal bleeding until 3 days ago.  Lower abdominal discomfort noted on palpation of the abdomen but no peritoneal signs and generally benign abdominal examination.  Pelvic exam shows copious amounts of blood in the vaginal vault, unable to visualize cervix.  She  has no cervical motion tenderness or adnexal tenderness and I doubt PID.  Lab work reviewed by me shows stable H&H, no leukocytosis, no metabolic derangements.  UA does not suggest UTI.  Wet prep shows many WBCs but no evidence of BV or yeast.  Quantitative hCG is 2, stable from last lab value and essentially negative.  Will obtain pelvic ultrasound for further evaluation.  3:38 PM Ultrasound shows no further retained products of conception, interval resolution of irregular thickened endometrium, also resolution of right ovarian cyst that was noted on prior ultrasound in March.  Serial abdominal examinations remain benign.  She has been afebrile and vital signs have been stable she has been in the ED.  Doubt intra-abdominal infection or acute surgical abdominal pathology including obstruction, perforation, appendicitis, cholecystitis, or dissection. No evidence of TOA or ectopic pregnancy. Stable for discharge home with follow-up with her OB/GYN in the next 3 to 4 days.  Discussed strict ED return precautions. Patient verbalized understanding of and agreement with plan and is safe for discharge home at this time.   Final Clinical Impressions(s) / ED Diagnoses   Final diagnoses:  Abnormal vaginal bleeding    ED Discharge Orders    None          Bennye Alm 12/02/18 1541    Charlynne Pander, MD 12/02/18 [phone]

## 2018-12-05 ENCOUNTER — Other Ambulatory Visit: Payer: Self-pay | Admitting: *Deleted

## 2018-12-05 ENCOUNTER — Other Ambulatory Visit: Payer: Self-pay

## 2018-12-05 DIAGNOSIS — O3680X0 Pregnancy with inconclusive fetal viability, not applicable or unspecified: Secondary | ICD-10-CM

## 2018-12-05 DIAGNOSIS — O4690 Antepartum hemorrhage, unspecified, unspecified trimester: Secondary | ICD-10-CM

## 2018-12-05 DIAGNOSIS — R9389 Abnormal findings on diagnostic imaging of other specified body structures: Secondary | ICD-10-CM

## 2018-12-05 LAB — GC/CHLAMYDIA PROBE AMP (~~LOC~~) NOT AT ARMC
Chlamydia: NEGATIVE
Neisseria Gonorrhea: NEGATIVE

## 2018-12-06 ENCOUNTER — Telehealth: Payer: Self-pay | Admitting: Student

## 2018-12-06 NOTE — Telephone Encounter (Signed)
Called the patient to inform of the upcoming appointment. Left a detailed voicemail message. °

## 2018-12-14 ENCOUNTER — Telehealth: Payer: Self-pay | Admitting: Obstetrics & Gynecology

## 2018-12-14 ENCOUNTER — Telehealth: Payer: Self-pay | Admitting: General Practice

## 2018-12-14 ENCOUNTER — Ambulatory Visit: Payer: Medicaid Other | Admitting: Obstetrics & Gynecology

## 2018-12-14 ENCOUNTER — Telehealth: Payer: Self-pay | Admitting: Obstetrics and Gynecology

## 2018-12-14 ENCOUNTER — Other Ambulatory Visit: Payer: Self-pay

## 2018-12-14 NOTE — Telephone Encounter (Signed)
Called the patient to inform of the face to face visit for the bleeding issues per Dr. Marice Potter. She stated she visited the MAU as suggested and she is not having the issue any more she feels much better. Attempted to consult with Dr. Marice Potter about the outcome and verify if the appointment should be rescheduled or continue as a virtual visit. Left a message with her nurse Lyla Son).

## 2018-12-14 NOTE — Telephone Encounter (Signed)
Called patient regarding appt today, no answer- left message to call us back regarding appt.

## 2018-12-16 NOTE — Telephone Encounter (Signed)
Opened in error

## 2019-01-06 ENCOUNTER — Telehealth: Payer: Self-pay

## 2019-01-06 NOTE — Telephone Encounter (Signed)
Left voicemail advising patient of address and Covid-19 precautions to no visitors and to wear a mask.

## 2019-01-09 ENCOUNTER — Other Ambulatory Visit: Payer: Self-pay

## 2019-01-09 ENCOUNTER — Ambulatory Visit (INDEPENDENT_AMBULATORY_CARE_PROVIDER_SITE_OTHER): Payer: Medicaid Other | Admitting: General Practice

## 2019-01-09 DIAGNOSIS — Z3201 Encounter for pregnancy test, result positive: Secondary | ICD-10-CM

## 2019-01-09 DIAGNOSIS — O219 Vomiting of pregnancy, unspecified: Secondary | ICD-10-CM

## 2019-01-09 LAB — POCT PREGNANCY, URINE: Preg Test, Ur: POSITIVE — AB

## 2019-01-09 MED ORDER — PROMETHAZINE HCL 25 MG PO TABS: 25.0000 mg | ORAL_TABLET | Freq: Four times a day (QID) | ORAL | 0 refills | Status: DC | PRN

## 2019-01-09 NOTE — Progress Notes (Signed)
Patient presents to office today for UPT. UPT +. Patient reports first positive home test 12/21/18. LMP 11/28/18 EDD 09/03/18 6w. Patient reports taking PNV at home- would like Rx for n/v. Rx sent in per protocol. Patient desires to begin care here. Patient will return to office for new OB intake & new OB appt. Patient had no questions.  Chase Caller RN BSN 01/09/19

## 2019-01-11 ENCOUNTER — Inpatient Hospital Stay (HOSPITAL_COMMUNITY): Payer: Medicaid Other

## 2019-01-11 ENCOUNTER — Encounter (HOSPITAL_COMMUNITY): Payer: Self-pay

## 2019-01-11 ENCOUNTER — Inpatient Hospital Stay (HOSPITAL_COMMUNITY)
Admission: AD | Admit: 2019-01-11 | Discharge: 2019-01-11 | Disposition: A | Discharge status: Home / Self Care | Payer: Medicaid Other | Attending: Obstetrics and Gynecology | Admitting: Obstetrics and Gynecology

## 2019-01-11 ENCOUNTER — Other Ambulatory Visit: Payer: Self-pay

## 2019-01-11 DIAGNOSIS — Z87891 Personal history of nicotine dependence: Secondary | ICD-10-CM | POA: Diagnosis not present

## 2019-01-11 DIAGNOSIS — O26891 Other specified pregnancy related conditions, first trimester: Secondary | ICD-10-CM | POA: Diagnosis not present

## 2019-01-11 DIAGNOSIS — Z3A01 Less than 8 weeks gestation of pregnancy: Secondary | ICD-10-CM | POA: Diagnosis not present

## 2019-01-11 DIAGNOSIS — O468X1 Other antepartum hemorrhage, first trimester: Secondary | ICD-10-CM

## 2019-01-11 DIAGNOSIS — R109 Unspecified abdominal pain: Secondary | ICD-10-CM | POA: Insufficient documentation

## 2019-01-11 DIAGNOSIS — O418X10 Other specified disorders of amniotic fluid and membranes, first trimester, not applicable or unspecified: Secondary | ICD-10-CM | POA: Diagnosis not present

## 2019-01-11 DIAGNOSIS — O3680X0 Pregnancy with inconclusive fetal viability, not applicable or unspecified: Secondary | ICD-10-CM | POA: Diagnosis not present

## 2019-01-11 DIAGNOSIS — O208 Other hemorrhage in early pregnancy: Secondary | ICD-10-CM | POA: Insufficient documentation

## 2019-01-11 DIAGNOSIS — Z6790 Unspecified blood type, Rh positive: Secondary | ICD-10-CM

## 2019-01-11 DIAGNOSIS — O209 Hemorrhage in early pregnancy, unspecified: Secondary | ICD-10-CM | POA: Diagnosis not present

## 2019-01-11 DIAGNOSIS — A749 Chlamydial infection, unspecified: Secondary | ICD-10-CM

## 2019-01-11 DIAGNOSIS — O26899 Other specified pregnancy related conditions, unspecified trimester: Secondary | ICD-10-CM

## 2019-01-11 LAB — CBC
HCT: 38 % (ref 36.0–46.0)
Hemoglobin: 12.4 g/dL (ref 12.0–15.0)
MCH: 30.2 pg (ref 26.0–34.0)
MCHC: 32.6 g/dL (ref 30.0–36.0)
MCV: 92.7 fL (ref 80.0–100.0)
Platelets: 277 10*3/uL (ref 150–400)
RBC: 4.1 MIL/uL (ref 3.87–5.11)
RDW: 12.1 % (ref 11.5–15.5)
WBC: 7.9 10*3/uL (ref 4.0–10.5)
nRBC: 0 % (ref 0.0–0.2)

## 2019-01-11 LAB — WET PREP, GENITAL
Clue Cells Wet Prep HPF POC: NONE SEEN
Sperm: NONE SEEN
Trich, Wet Prep: NONE SEEN
Yeast Wet Prep HPF POC: NONE SEEN

## 2019-01-11 LAB — COMPREHENSIVE METABOLIC PANEL
ALT: 20 U/L (ref 0–44)
AST: 22 U/L (ref 15–41)
Albumin: 4.4 g/dL (ref 3.5–5.0)
Alkaline Phosphatase: 48 U/L (ref 38–126)
Anion gap: 8 (ref 5–15)
BUN: 5 mg/dL — ABNORMAL LOW (ref 6–20)
CO2: 23 mmol/L (ref 22–32)
Calcium: 9.3 mg/dL (ref 8.9–10.3)
Chloride: 107 mmol/L (ref 98–111)
Creatinine, Ser: 0.61 mg/dL (ref 0.44–1.00)
GFR calc Af Amer: >60 mL/min (ref >60)
GFR calc non Af Amer: >60 mL/min (ref >60)
Glucose, Bld: 90 mg/dL (ref 70–99)
Potassium: 3.4 mmol/L — ABNORMAL LOW (ref 3.5–5.1)
Sodium: 138 mmol/L (ref 135–145)
Total Bilirubin: 1.1 mg/dL (ref 0.3–1.2)
Total Protein: 7.2 g/dL (ref 6.5–8.1)

## 2019-01-11 LAB — URINALYSIS, ROUTINE W REFLEX MICROSCOPIC

## 2019-01-11 LAB — HCG, QUANTITATIVE, PREGNANCY: hCG, Beta Chain, Quant, S: 38511 m[IU]/mL — ABNORMAL HIGH (ref <5)

## 2019-01-11 LAB — URINALYSIS, MICROSCOPIC (REFLEX): RBC / HPF: >50 RBC/hpf (ref 0–5)

## 2019-01-11 NOTE — Discharge Instructions (Signed)
Subchorionic Hematoma ° °A subchorionic hematoma is a gathering of blood between the outer wall of the embryo (chorion) and the inner wall of the womb (uterus). °This condition can cause vaginal bleeding. If they cause little or no vaginal bleeding, early small hematomas usually shrink on their own and do not affect your baby or pregnancy. When bleeding starts later in pregnancy, or if the hematoma is larger or occurs in older pregnant women, the condition may be more serious. Larger hematomas may get bigger, which increases the chances of miscarriage. This condition also increases the risk of: °· Premature separation of the placenta from the uterus. °· Premature (preterm) labor. °· Stillbirth. °What are the causes? °The exact cause of this condition is not known. It occurs when blood is trapped between the placenta and the uterine wall because the placenta has separated from the original site of implantation. °What increases the risk? °You are more likely to develop this condition if: °· You were treated with fertility medicines. °· You conceived through in vitro fertilization (IVF). °What are the signs or symptoms? °Symptoms of this condition include: °· Vaginal spotting or bleeding. °· Contractions of the uterus. These cause abdominal pain. °Sometimes you may have no symptoms and the bleeding may only be seen when ultrasound images are taken (transvaginal ultrasound). °How is this diagnosed? °This condition is diagnosed based on a physical exam. This includes a pelvic exam. You may also have other tests, including: °· Blood tests. °· Urine tests. °· Ultrasound of the abdomen. °How is this treated? °Treatment for this condition can vary. Treatment may include: °· Watchful waiting. You will be monitored closely for any changes in bleeding. During this stage: °? The hematoma may be reabsorbed by the body. °? The hematoma may separate the fluid-filled space containing the embryo (gestational sac) from the wall of the  womb (endometrium). °· Medicines. °· Activity restriction. This may be needed until the bleeding stops. °Follow these instructions at home: °· Stay on bed rest if told to do so by your health care provider. °· Do not lift anything that is heavier than 10 lbs. (4.5 kg) or as told by your health care provider. °· Do not use any products that contain nicotine or tobacco, such as cigarettes and e-cigarettes. If you need help quitting, ask your health care provider. °· Track and write down the number of pads you use each day and how soaked (saturated) they are. °· Do not use tampons. °· Keep all follow-up visits as told by your health care provider. This is important. Your health care provider may ask you to have follow-up blood tests or ultrasound tests or both. °Contact a health care provider if: °· You have any vaginal bleeding. °· You have a fever. °Get help right away if: °· You have severe cramps in your stomach, back, abdomen, or pelvis. °· You pass large clots or tissue. Save any tissue for your health care provider to look at. °· You have more vaginal bleeding, and you faint or become lightheaded or weak. °Summary °· A subchorionic hematoma is a gathering of blood between the outer wall of the placenta and the uterus. °· This condition can cause vaginal bleeding. °· Sometimes you may have no symptoms and the bleeding may only be seen when ultrasound images are taken. °· Treatment may include watchful waiting, medicines, or activity restriction. °This information is not intended to replace advice given to you by your health care provider. Make sure you discuss any questions you   have with your health care provider. °Document Released: 11/11/2006 Document Revised: 09/22/2016 Document Reviewed: 09/22/2016 °Elsevier Interactive Patient Education © 2019 [employer] °Abdominal Pain During Pregnancy ° °Abdominal pain is common during pregnancy, and has many possible causes. Some causes are more serious than others,  and sometimes the cause is not known. Abdominal pain can be a sign that labor is starting. It can also be caused by normal growth and stretching of muscles and ligaments during pregnancy. Always tell your health care provider if you have any abdominal pain. °Follow these instructions at home: °· Do not have sex or put anything in your vagina until your pain goes away completely. °· Get plenty of rest until your pain improves. °· Drink enough fluid to keep your urine pale yellow. °· Take over-the-counter and prescription medicines only as told by your health care provider. °· Keep all follow-up visits as told by your health care provider. This is important. °Contact a health care provider if: °· Your pain continues or gets worse after resting. °· You have lower abdominal pain that: °? Comes and goes at regular intervals. °? Spreads to your back. °? Is similar to menstrual cramps. °· You have pain or burning when you urinate. °Get help right away if: °· You have a fever or chills. °· You have vaginal bleeding. °· You are leaking fluid from your vagina. °· You are passing tissue from your vagina. °· You have vomiting or diarrhea that lasts for more than 24 hours. °· Your baby is moving less than usual. °· You feel very weak or faint. °· You have shortness of breath. °· You develop severe pain in your upper abdomen. °Summary °· Abdominal pain is common during pregnancy, and has many possible causes. °· If you experience abdominal pain during pregnancy, tell your health care provider right away. °· Follow your health care provider's home care instructions and keep all follow-up visits as directed. °This information is not intended to replace advice given to you by your health care provider. Make sure you discuss any questions you have with your health care provider. °Document Released: 07/27/2005 Document Revised: 10/29/2016 Document Reviewed: 10/29/2016 °Elsevier Interactive Patient Education © 2019 [employer] ° °

## 2019-01-11 NOTE — MAU Note (Signed)
Pt reports spotting since yesterday.  Pt also c/o some abd. Cramping that she rates 3/10.

## 2019-01-11 NOTE — MAU Provider Note (Signed)
History    Patient Julie Wilkinson is a  23 y.o. [phone]; At 6 weeks by uncertain LMP here with complaints of bleeding and cramping that started at midnight. She had a positive pregnancy test on Monday  (6-1) [facility]. She denies dysuria, NV, headache, fever, SOB, body aches. She had a TAB in February and only had one period in April; her gestation age is based on that LMP.   CSN: [id number]  Arrival date and time: 01/11/19 0812   None     Chief Complaint  Patient presents with  . Vaginal Bleeding  . Abdominal Pain   Vaginal Bleeding  The patient's primary symptoms include vaginal bleeding. This is a new problem. The current episode started yesterday. The problem occurs intermittently. The problem has been gradually worsening. The pain is moderate. Associated symptoms include abdominal pain. Pertinent negatives include no diarrhea, urgency or vomiting. The vaginal discharge was bloody. The vaginal bleeding is typical of menses. She has not been passing clots. She has not been passing tissue.  Abdominal Pain  This is a new problem. The current episode started today. The pain is at a severity of 3/10. The quality of the pain is cramping. Pertinent negatives include no diarrhea or vomiting. Nothing aggravates the pain. The pain is relieved by nothing.    OB History    Gravida  4   Para  1   Term  1   Preterm      AB  1   Living  1     SAB      TAB  1   Ectopic      Multiple      Live Births  1           Past Medical History:  Diagnosis Date  . Medical history non-contributory     Past Surgical History:  Procedure Laterality Date  . NO PAST SURGERIES      Family History  Problem Relation Age of Onset  . Bipolar disorder Mother   . Depression Father   . Bipolar disorder Sister     Social History   Tobacco Use  . Smoking status: Former Games developermoker  . Smokeless tobacco: Never Used  Substance Use Topics  . Alcohol use: Not Currently  . Drug use: No     Allergies:  Allergies  Allergen Reactions  . Loracarbef     Medications Prior to Admission  Medication Sig Dispense Refill Last Dose  . clonazePAM (KLONOPIN) 0.5 MG tablet Take 1 tablet (0.5 mg total) daily as needed by mouth for anxiety. 30 tablet 0   . FLUoxetine (PROZAC) 40 MG capsule Take 1 capsule (40 mg total) daily by mouth. 30 capsule 0   . metroNIDAZOLE (FLAGYL) 500 MG tablet Take 1 tablet (500 mg total) by mouth 2 (two) times daily. 14 tablet 0   . misoprostol (CYTOTEC) 200 MCG tablet Place 4 tablets vaginally 4 tablet 0   . oxyCODONE-acetaminophen (PERCOCET/ROXICET) 5-325 MG tablet Take 1 tablet by mouth every 4 (four) hours as needed. 10 tablet 0   . oxyCODONE-acetaminophen (PERCOCET/ROXICET) 5-325 MG tablet Take 1 pill po q 4 hour prn 10 tablet 0   . promethazine (PHENERGAN) 25 MG tablet Take 1 tablet (25 mg total) by mouth every 6 (six) hours as needed for nausea or vomiting. 30 tablet 0     Review of Systems  Constitutional: Negative.   HENT: Negative.   Respiratory: Negative.   Cardiovascular: Negative.   Gastrointestinal: Positive  for abdominal pain. Negative for diarrhea and vomiting.  Genitourinary: Positive for vaginal bleeding. Negative for urgency.  Neurological: Negative.   Psychiatric/Behavioral: Negative.    Physical Exam   Blood pressure 113/61, pulse (!) 108, temperature 98.2 F (36.8 C), temperature source Oral, resp. rate 18, height 5\' 3"  (1.6 m), weight 61.8 kg, last menstrual period 11/28/2018, SpO2 97 %, unknown if currently breastfeeding.  Physical Exam  Constitutional: She appears well-developed.  HENT:  Head: Normocephalic.  Neck: Normal range of motion.  Respiratory: Effort normal.  GI: Soft.  Genitourinary:    Genitourinary Comments: NEFG: bright red blood in the vaginal vault but no active bleeding. Cervix appears FT with mucous extruding from it. No CMT, suprapubic or adnexal tenderness.    Neurological: She is alert.  Skin: Skin  is warm and dry.    MAU Course  Procedures  MDM -complete ectopic work up performed; US shows yolk sac but no FP. Beta is 38,000.  -Reviewed with patient that right now her pregnancy has uncertain viability; it would be appropriate to take cytotec if she desires.  -Patient states that she desires pregnancy and wants to "wait and see" what happens".  -Informed of SCH, which can also VB.  Assessment and Plan   1. Pregnancy with uncertain fetal viability, single or unspecified fetus   2. Vaginal bleeding in pregnancy, first trimester   3. Abdominal pain during pregnancy, antepartum   4. [redacted] weeks gestation of pregnancy   5. Blood type, Rh positive   6. Subchorionic hematoma in first trimester, single or unspecified fetus     2. Patient stable for discharge with bleeding precautions, including explanation of [facility].  Explained to patient that she may start having increased bleeding and pain, indicating a miscarriage.  She should return to MAU if that happens.   3. Outpatient OB transvaginal ordered for two weeks from now; patient knows that we will call her to schedule that.   4. Patient verbalized understanding; all questions answered.   Charlesetta Garibaldi Decarlo Rivet 01/11/2019, 9:49 AM

## 2019-01-12 NOTE — Progress Notes (Signed)
I have reviewed this chart and agree with the RN/CMA assessment and management.    Vondell Sowell C Camdyn Laden, MD, FACOG Attending Physician, Faculty Practice [facility]  

## 2019-01-13 LAB — GC/CHLAMYDIA PROBE AMP (~~LOC~~) NOT AT ARMC
Chlamydia: POSITIVE — AB
Neisseria Gonorrhea: NEGATIVE

## 2019-01-13 MED ORDER — AZITHROMYCIN 500 MG PO TABS: 1000.0000 mg | ORAL_TABLET | Freq: Once | ORAL | 0 refills | Status: AC

## 2019-01-13 NOTE — Telephone Encounter (Addendum)
Julie Wilkinson tested positive for  Chlamydia. Patient was called by RN and allergies and pharmacy confirmed. Rx sent to pharmacy of choice.   Judeth Horn, NP 01/13/2019 1:27 PM        ----- Message from Kathe Becton, RN sent at 01/13/2019  1:22 PM EDT ----- This patient tested p ositive for; Chlamydia  She "is allergic to Loracarbef",  have informed the patient of her results and confirmed her pharmacy is correct in her chart. Please send Rx.   Thank you,   Kathe Becton, RN   Results faxed to [facility].

## 2019-01-16 ENCOUNTER — Encounter: Payer: Self-pay | Admitting: Student

## 2019-01-16 DIAGNOSIS — O98819 Other maternal infectious and parasitic diseases complicating pregnancy, unspecified trimester: Secondary | ICD-10-CM | POA: Insufficient documentation

## 2019-01-16 DIAGNOSIS — A749 Chlamydial infection, unspecified: Secondary | ICD-10-CM | POA: Insufficient documentation

## 2019-01-20 ENCOUNTER — Telehealth: Payer: Self-pay | Admitting: Family Medicine

## 2019-01-20 NOTE — Telephone Encounter (Signed)
Spoke with patient about her appointment on 6/15 @ 8:15. Patient instructed that this visit will be a mychart visit and verbalized that she had the app downloaded and knows how to get to the appointment. Patient was not screened for covid-19 symptoms due to it being a virtual appointment.

## 2019-01-23 ENCOUNTER — Telehealth: Payer: Medicaid Other

## 2019-01-23 ENCOUNTER — Encounter: Payer: Self-pay | Admitting: Family Medicine

## 2019-01-23 ENCOUNTER — Telehealth: Payer: Self-pay | Admitting: Family Medicine

## 2019-01-23 ENCOUNTER — Other Ambulatory Visit: Payer: Self-pay

## 2019-01-23 NOTE — Progress Notes (Signed)
I have reviewed this chart and agree with the RN/CMA assessment and management.    Anara Cowman C Ashling Roane, MD, FACOG Attending Physician, [facility]  

## 2019-01-23 NOTE — Telephone Encounter (Signed)
Attempted to call patient to get her rescheduled for her missed new ob intake appointment. No answer, left voicemail for patient to give the office a call back to be rescheduled. No show letter mailed.  °

## 2019-01-23 NOTE — Progress Notes (Unsigned)
0824 I called [place] and left a message I am trying to reach you for your appointment. I am connected virtually - you can connect with me virtually or I will call you again in few minutes- please be available. Carlyn Lemke,RN 0830 I called [place] again and left message I was trying to connect virtually or by phone for her appointment and since we could not connect she will need to call to reschedule her appointment. I also stated that it appeared she connected virtually before her appointment time- I also advised when we are doing virtual visit please be aware we will connect with you either a few minutes before your appointment, at your appointment time or a few minutes after.  Ej Pinson,RN

## 2019-01-25 ENCOUNTER — Telehealth: Payer: Self-pay

## 2019-01-25 ENCOUNTER — Telehealth: Payer: Self-pay | Admitting: Family Medicine

## 2019-01-25 ENCOUNTER — Ambulatory Visit (INDEPENDENT_AMBULATORY_CARE_PROVIDER_SITE_OTHER): Payer: Medicaid Other | Admitting: *Deleted

## 2019-01-25 ENCOUNTER — Ambulatory Visit (HOSPITAL_COMMUNITY)
Admission: RE | Admit: 2019-01-25 | Discharge: 2019-01-25 | Disposition: A | Discharge status: Home / Self Care | Payer: Medicaid Other | Source: Ambulatory Visit | Attending: Obstetrics and Gynecology | Admitting: Obstetrics and Gynecology

## 2019-01-25 ENCOUNTER — Other Ambulatory Visit: Payer: Self-pay

## 2019-01-25 DIAGNOSIS — O3680X0 Pregnancy with inconclusive fetal viability, not applicable or unspecified: Secondary | ICD-10-CM | POA: Insufficient documentation

## 2019-01-25 DIAGNOSIS — O209 Hemorrhage in early pregnancy, unspecified: Secondary | ICD-10-CM

## 2019-01-25 DIAGNOSIS — Z3A01 Less than 8 weeks gestation of pregnancy: Secondary | ICD-10-CM | POA: Diagnosis not present

## 2019-01-25 NOTE — Telephone Encounter (Signed)
Pt called Nurse line returning a call today @ 1:11p. Called pt back and gave her appoint for both her new OB Intake virtual visit & her New OB face to face. Pt verbalized understanding.

## 2019-01-25 NOTE — Progress Notes (Signed)
Pt presents for ultrasound results. Dr. Kennon Rounds reviewed results @ 1115 and pt was advised of normal progression of pregnancy with cardiac activity present. EGA 7w 4d which is 5 days less than EGA by uncertain LMP. Pt reports only having small amount of brown vaginal spotting and does not have abdominal pain. Pt was advised to return to MAU if heavy vaginal bleeding or severe abdominal pain occurs. She voiced understanding of all information and instructions given. Ultrasound pictures provided to pt. New Ob intake virtual visit appointment scheduled.

## 2019-01-25 NOTE — Progress Notes (Signed)
Patient seen and assessed by nursing staff.  Agree with documentation and plan.  

## 2019-01-25 NOTE — Telephone Encounter (Signed)
Attempted to call patient to get her scheduled for her new ob intake appointment. No answer left detailed missed that the appointment will be on mychart and its important that she completes appointment otherwise her appointment on 7/1 will be cancelled. Advised to give the office a call back if she is needing to reschedule

## 2019-01-27 ENCOUNTER — Telehealth: Payer: Self-pay | Admitting: Family Medicine

## 2019-01-27 NOTE — Telephone Encounter (Signed)
Attempted to call patient about her appointment on 6/22 @ 10:15. No answer, left voicemail instructing patient to be sure she has the [employer] before her appointment. Instructed that she is needs any assistance getting the app to give the office a call back. Screening not completed due to it being a virtual appointment.

## 2019-01-30 ENCOUNTER — Telehealth (INDEPENDENT_AMBULATORY_CARE_PROVIDER_SITE_OTHER): Payer: Medicaid Other

## 2019-01-30 ENCOUNTER — Other Ambulatory Visit: Payer: Self-pay

## 2019-01-30 DIAGNOSIS — Z3490 Encounter for supervision of normal pregnancy, unspecified, unspecified trimester: Secondary | ICD-10-CM | POA: Insufficient documentation

## 2019-01-30 MED ORDER — AMBULATORY NON FORMULARY MEDICATION: 1.0000 | 0 refills | Status: DC

## 2019-01-30 NOTE — Progress Notes (Signed)
I connected with  Julie Wilkinson on 01/30/19 at 10:15 AM EDT by telephone and verified that I am speaking with the correct person using two identifiers.   I discussed the limitations, risks, security and privacy concerns of performing an evaluation and management service by telephone and the availability of in person appointments. I also discussed with the patient that there may be a patient responsible charge related to this service. The patient expressed understanding and agreed to proceed.  Pt reports brown colored blood from subchronic hemorrhage.  Pt denies any pain.  History intake completed.  Pt advised to think about genetic screening because it will be offered.  I also stated that she will get other initial prenatal labs along with a pap smear.  Pt explained that we will have a BP cuff mailed to her home from [place] and that if she does not receive cuff in a week to please give the office a call.  I advised pt to bring BP cuff to her appt scheduled on 02/08/19 so we can make sure that she know how to use the BP cuff.  Pt verbalized understanding.  Pt also logged into [employer] and able log BP in app successfully.  Pt informed that once she receives cuff that she will need to take her BP once a week and log into app as she did.  I also advised pt to have BP cuff available for her virtual appt.  Pt stated understanding and did not have any further questions.  Information sent to Lorriane Shire to fax to [facility] to request BP cuff.    Mel Almond, RN 01/30/2019  12:16 PM

## 2019-01-31 NOTE — Progress Notes (Signed)
I have reviewed the chart and agree with nursing staff's documentation of this patient's encounter.  Smokey Melott, MD 01/31/2019 9:26 AM    

## 2019-02-06 DIAGNOSIS — Z3490 Encounter for supervision of normal pregnancy, unspecified, unspecified trimester: Secondary | ICD-10-CM | POA: Diagnosis not present

## 2019-02-07 ENCOUNTER — Telehealth: Payer: Self-pay | Admitting: Advanced Practice Midwife

## 2019-02-07 NOTE — Telephone Encounter (Signed)
Called the patient to pre-screen. Left a detailed voicemail informing if the patient is experiencing any flu-like symptoms such as fever, chills, cough, or shortness of breath and/or has been in contact with anyone who is suspected of/or confirmed to have COVID19 please call back to reschedule the appointment. Upon entering the office please wear a face mask, sanitize hands, and no visitors or children due to COVID19 restrictions. °

## 2019-02-08 ENCOUNTER — Ambulatory Visit (INDEPENDENT_AMBULATORY_CARE_PROVIDER_SITE_OTHER): Payer: Medicaid Other | Admitting: Obstetrics & Gynecology

## 2019-02-08 ENCOUNTER — Other Ambulatory Visit: Payer: Self-pay

## 2019-02-08 ENCOUNTER — Other Ambulatory Visit (HOSPITAL_COMMUNITY)
Admission: RE | Admit: 2019-02-08 | Discharge: 2019-02-08 | Disposition: A | Discharge status: Home / Self Care | Payer: Medicaid Other | Source: Ambulatory Visit | Attending: Obstetrics & Gynecology | Admitting: Obstetrics & Gynecology

## 2019-02-08 VITALS — BP 108/72 | HR 87 | Temp 98.1°F | Wt 136.8 lb

## 2019-02-08 DIAGNOSIS — Z3490 Encounter for supervision of normal pregnancy, unspecified, unspecified trimester: Secondary | ICD-10-CM | POA: Insufficient documentation

## 2019-02-08 DIAGNOSIS — A749 Chlamydial infection, unspecified: Secondary | ICD-10-CM

## 2019-02-08 DIAGNOSIS — Z3A10 10 weeks gestation of pregnancy: Secondary | ICD-10-CM

## 2019-02-08 DIAGNOSIS — B951 Streptococcus, group B, as the cause of diseases classified elsewhere: Secondary | ICD-10-CM

## 2019-02-08 DIAGNOSIS — O2341 Unspecified infection of urinary tract in pregnancy, first trimester: Secondary | ICD-10-CM

## 2019-02-08 DIAGNOSIS — O98811 Other maternal infectious and parasitic diseases complicating pregnancy, first trimester: Secondary | ICD-10-CM | POA: Diagnosis not present

## 2019-02-08 DIAGNOSIS — O219 Vomiting of pregnancy, unspecified: Secondary | ICD-10-CM

## 2019-02-08 MED ORDER — PROMETHAZINE HCL 25 MG PO TABS: 25.0000 mg | ORAL_TABLET | Freq: Four times a day (QID) | ORAL | 0 refills | Status: DC | PRN

## 2019-02-08 NOTE — Progress Notes (Signed)
History:   Julie Wilkinson is a 23 y.o. [phone] at 24w2dby LMP, early ultrasound being seen today for her first obstetrical visit.  Her obstetrical history is significant for term SVD. Patient also was diagnosed with chlamydia last month and treated. Patient does intend to breast feed. Pregnancy history fully reviewed.  Patient reports no complaints.      HISTORY: OB History  Gravida Para Term Preterm AB Living  4 1 1  0 2 1  SAB TAB Ectopic Multiple Live Births  1 1 0 0 1    # Outcome Date GA Lbr Len/2nd Weight Sex Delivery Anes PTL Lv  4 Current           3 TAB 10/06/18          2 Term 08/29/14 432w0d7 lb 8 oz (3.402 kg) M Vag-Spont EPI N LIV  1 SAB 2015            Last pap smear was several years ago and was normal  Past Medical History:  Diagnosis Date  . Anxiety 2017   Past Surgical History:  Procedure Laterality Date  . HYMENECTOMY  [age]ears old   Family History  Problem Relation Age of Onset  . Bipolar disorder Mother   . Depression Father   . Bipolar disorder Sister    Social History   Tobacco Use  . Smoking status: Former Smoker    Types: Cigarettes    Quit date: 2015    Years since quitting: 5.5  . Smokeless tobacco: Never Used  Substance Use Topics  . Alcohol use: Not Currently  . Drug use: No   Allergies  Allergen Reactions  . Loracarbef    Current Outpatient Medications on File Prior to Visit  Medication Sig Dispense Refill  . AMBULATORY NON FORMULARY MEDICATION 1 Device by Other route once a week. Blood pressure cuff/ regular  Monitored regularly at home ICD 10: Z34.90 1 kit 0  . promethazine (PHENERGAN) 25 MG tablet Take 1 tablet (25 mg total) by mouth every 6 (six) hours as needed for nausea or vomiting. 30 tablet 0   No current facility-administered medications on file prior to visit.     Review of Systems Pertinent items noted in HPI and remainder of comprehensive ROS otherwise negative. Physical Exam:   Vitals:   02/08/19 1358   BP: 108/72  Pulse: 87  Temp: 98.1 F (36.7 C)  Weight: 136 lb 12.8 oz (62.1 kg)     Uterus:     Pelvic Exam: Perineum: no hemorrhoids, normal perineum   Vulva: normal external genitalia, no lesions   Vagina:  normal mucosa, yellow discharge seen today, testing sample seen.   Cervix: no lesions and normal, pap smear done.    Adnexa: normal adnexa and no mass, fullness, tenderness   Bony Pelvis: average  System: General: well-developed, well-nourished female in no acute distress   Breasts:  normal appearance, no masses or tenderness bilaterally   Skin: normal coloration and turgor, no rashes   Neurologic: oriented, normal, negative, normal mood   Extremities: normal strength, tone, and muscle mass, ROM of all joints is normal   HEENT PERRLA, extraocular movement intact and sclera clear, anicteric   Mouth/Teeth mucous membranes moist, pharynx normal without lesions and dental hygiene good   Neck supple and no masses   Cardiovascular: regular rate and rhythm   Respiratory:  no respiratory distress, normal breath sounds   Abdomen: soft, non-tender; bowel sounds normal; no masses,  no organomegaly  **Bedside Ultrasound for FHR check: 160 bpm Patient informed that the ultrasound is considered a limited obstetric ultrasound and is not intended to be a complete ultrasound exam.  Patient also informed that the ultrasound is not being completed with the intent of assessing for fetal or placental anomalies or any pelvic abnormalities.  Explained that the purpose of today's ultrasound is to assess for fetal heart rate.  Patient acknowledges the purpose of the exam and the limitations of the study.     Assessment:    Pregnancy: [id number] Patient Active Problem List   Diagnosis Date Noted  . Supervision of low-risk pregnancy 01/30/2019  . Chlamydia infection affecting pregnancy 01/16/2019  . PTSD (post-traumatic stress disorder) 05/17/2017     Plan:    1. Chlamydia infection affecting  pregnancy in first trimester Test of cure done today  2. Encounter for supervision of low-risk pregnancy, antepartum - Culture, OB Urine - Genetic Screening - Obstetric Panel, Including HIV - [place] MFM OB DETAIL +14 WK; Future - Cytology - PAP( [place]) - Comprehensive metabolic panel - Hemoglobin A1c - Cervicovaginal ancillary only( ) Initial labs drawn. Continue prenatal vitamins. Genetic Screening discussed, NIPS: ordered. Ultrasound discussed; fetal anatomic survey: ordered. Problem list reviewed and updated. The nature of [place] with multiple MDs and other Advanced Practice Providers was explained to patient; also emphasized that residents, students are part of our team. Routine obstetric precautions reviewed. Return in about 4 weeks (around 03/08/2019) for Virtual LOB Visit.     Verita Schneiders, MD, [place] for [employer], [place]

## 2019-02-08 NOTE — Patient Instructions (Signed)
First Trimester of Pregnancy The first trimester of pregnancy is from week 1 until the end of week 13 (months 1 through 3). A week after a sperm fertilizes an egg, the egg will implant on the wall of the uterus. This embryo will begin to develop into a baby. Genes from you and your partner will form the baby. The female genes will determine whether the baby will be a boy or a girl. At 6-8 weeks, the eyes and face will be formed, and the heartbeat can be seen on ultrasound. At the end of 12 weeks, all the baby's organs will be formed. Now that you are pregnant, you will want to do everything you can to have a healthy baby. Two of the most important things are to get good prenatal care and to follow your health care provider's instructions. Prenatal care is all the medical care you receive before the baby's birth. This care will help prevent, find, and treat any problems during the pregnancy and childbirth. Body changes during your first trimester Your body goes through many changes during pregnancy. The changes vary from woman to woman.  You may gain or lose a couple of pounds at first.  You may feel sick to your stomach (nauseous) and you may throw up (vomit). If the vomiting is uncontrollable, call your health care provider.  You may tire easily.  You may develop headaches that can be relieved by medicines. All medicines should be approved by your health care provider.  You may urinate more often. Painful urination may mean you have a bladder infection.  You may develop heartburn as a result of your pregnancy.  You may develop constipation because certain hormones are causing the muscles that push stool through your intestines to slow down.  You may develop hemorrhoids or swollen veins (varicose veins).  Your breasts may begin to grow larger and become tender. Your nipples may stick out more, and the tissue that surrounds them (areola) may become darker.  Your gums may bleed and may be  sensitive to brushing and flossing.  Dark spots or blotches (chloasma, mask of pregnancy) may develop on your face. This will likely fade after the baby is born.  Your menstrual periods will stop.  You may have a loss of appetite.  You may develop cravings for certain kinds of food.  You may have changes in your emotions from day to day, such as being excited to be pregnant or being concerned that something may go wrong with the pregnancy and baby.  You may have more vivid and strange dreams.  You may have changes in your hair. These can include thickening of your hair, rapid growth, and changes in texture. Some women also have hair loss during or after pregnancy, or hair that feels dry or thin. Your hair will most likely return to normal after your baby is born. What to expect at prenatal visits During a routine prenatal visit:  You will be weighed to make sure you and the baby are growing normally.  Your blood pressure will be taken.  Your abdomen will be measured to track your baby's growth.  The fetal heartbeat will be listened to between weeks 10 and 14 of your pregnancy.  Test results from any previous visits will be discussed. Your health care provider may ask you:  How you are feeling.  If you are feeling the baby move.  If you have had any abnormal symptoms, such as leaking fluid, bleeding, severe headaches, or abdominal   cramping.  If you are using any tobacco products, including cigarettes, chewing tobacco, and electronic cigarettes.  If you have any questions. Other tests that may be performed during your first trimester include:  Blood tests to find your blood type and to check for the presence of any previous infections. The tests will also be used to check for low iron levels (anemia) and protein on red blood cells (Rh antibodies). Depending on your risk factors, or if you previously had diabetes during pregnancy, you may have tests to check for high blood sugar  that affects pregnant women (gestational diabetes).  Urine tests to check for infections, diabetes, or protein in the urine.  An ultrasound to confirm the proper growth and development of the baby.  Fetal screens for spinal cord problems (spina bifida) and Down syndrome.  HIV (human immunodeficiency virus) testing. Routine prenatal testing includes screening for HIV, unless you choose not to have this test.  You may need other tests to make sure you and the baby are doing well. Follow these instructions at home: Medicines  Follow your health care provider's instructions regarding medicine use. Specific medicines may be either safe or unsafe to take during pregnancy.  Take a prenatal vitamin that contains at least 600 micrograms (mcg) of folic acid.  If you develop constipation, try taking a stool softener if your health care provider approves. Eating and drinking   Eat a balanced diet that includes fresh fruits and vegetables, whole grains, good sources of protein such as meat, eggs, or tofu, and low-fat dairy. Your health care provider will help you determine the amount of weight gain that is right for you.  Avoid raw meat and uncooked cheese. These carry germs that can cause birth defects in the baby.  Eating four or five small meals rather than three large meals a day may help relieve nausea and vomiting. If you start to feel nauseous, eating a few soda crackers can be helpful. Drinking liquids between meals, instead of during meals, also seems to help ease nausea and vomiting.  Limit foods that are high in fat and processed sugars, such as fried and sweet foods.  To prevent constipation: ? Eat foods that are high in fiber, such as fresh fruits and vegetables, whole grains, and beans. ? Drink enough fluid to keep your urine clear or pale yellow. Activity  Exercise only as directed by your health care provider. Most women can continue their usual exercise routine during  pregnancy. Try to exercise for 30 minutes at least 5 days a week. Exercising will help you: ? Control your weight. ? Stay in shape. ? Be prepared for labor and delivery.  Experiencing pain or cramping in the lower abdomen or lower back is a good sign that you should stop exercising. Check with your health care provider before continuing with normal exercises.  Try to avoid standing for long periods of time. Move your legs often if you must stand in one place for a long time.  Avoid heavy lifting.  Wear low-heeled shoes and practice good posture.  You may continue to have sex unless your health care provider tells you not to. Relieving pain and discomfort  Wear a good support bra to relieve breast tenderness.  Take warm sitz baths to soothe any pain or discomfort caused by hemorrhoids. Use hemorrhoid cream if your health care provider approves.  Rest with your legs elevated if you have leg cramps or low back pain.  If you develop varicose veins in   your legs, wear support hose. Elevate your feet for 15 minutes, 3-4 times a day. Limit salt in your diet. Prenatal care  Schedule your prenatal visits by the twelfth week of pregnancy. They are usually scheduled monthly at first, then more often in the last 2 months before delivery.  Write down your questions. Take them to your prenatal visits.  Keep all your prenatal visits as told by your health care provider. This is important. Safety  Wear your seat belt at all times when driving.  Make a list of emergency phone numbers, including numbers for family, friends, the hospital, and police and fire departments. General instructions  Ask your health care provider for a referral to a local prenatal education class. Begin classes no later than the beginning of month 6 of your pregnancy.  Ask for help if you have counseling or nutritional needs during pregnancy. Your health care provider can offer advice or refer you to specialists for help  with various needs.  Do not use hot tubs, steam rooms, or saunas.  Do not douche or use tampons or scented sanitary pads.  Do not cross your legs for long periods of time.  Avoid cat litter boxes and soil used by cats. These carry germs that can cause birth defects in the baby and possibly loss of the fetus by miscarriage or stillbirth.  Avoid all smoking, herbs, alcohol, and medicines not prescribed by your health care provider. Chemicals in these products affect the formation and growth of the baby.  Do not use any products that contain nicotine or tobacco, such as cigarettes and e-cigarettes. If you need help quitting, ask your health care provider. You may receive counseling support and other resources to help you quit.  Schedule a dentist appointment. At home, brush your teeth with a soft toothbrush and be gentle when you floss. Contact a health care provider if:  You have dizziness.  You have mild pelvic cramps, pelvic pressure, or nagging pain in the abdominal area.  You have persistent nausea, vomiting, or diarrhea.  You have a bad smelling vaginal discharge.  You have pain when you urinate.  You notice increased swelling in your face, hands, legs, or ankles.  You are exposed to fifth disease or chickenpox.  You are exposed to German measles (rubella) and have never had it. Get help right away if:  You have a fever.  You are leaking fluid from your vagina.  You have spotting or bleeding from your vagina.  You have severe abdominal cramping or pain.  You have rapid weight gain or loss.  You vomit blood or material that looks like coffee grounds.  You develop a severe headache.  You have shortness of breath.  You have any kind of trauma, such as from a fall or a car accident. Summary  The first trimester of pregnancy is from week 1 until the end of week 13 (months 1 through 3).  Your body goes through many changes during pregnancy. The changes vary from  woman to woman.  You will have routine prenatal visits. During those visits, your health care provider will examine you, discuss any test results you may have, and talk with you about how you are feeling. This information is not intended to replace advice given to you by your health care provider. Make sure you discuss any questions you have with your health care provider. Document Released: 07/21/2001 Document Revised: 07/09/2017 Document Reviewed: 07/08/2016 Elsevier Patient Education  2020 Elsevier Inc.  

## 2019-02-09 LAB — CYTOLOGY - PAP
Chlamydia: NEGATIVE
Diagnosis: NEGATIVE
Neisseria Gonorrhea: NEGATIVE

## 2019-02-10 LAB — OBSTETRIC PANEL, INCLUDING HIV
Antibody Screen: NEGATIVE
Basophils Absolute: 0 10*3/uL (ref 0.0–0.2)
Basos: 0 %
EOS (ABSOLUTE): 0 10*3/uL (ref 0.0–0.4)
Eos: 0 %
HIV Screen 4th Generation wRfx: NONREACTIVE
Hematocrit: 37.9 % (ref 34.0–46.6)
Hemoglobin: 12.8 g/dL (ref 11.1–15.9)
Hepatitis B Surface Ag: NEGATIVE
Immature Grans (Abs): 0 10*3/uL (ref 0.0–0.1)
Immature Granulocytes: 0 %
Lymphocytes Absolute: 1.9 10*3/uL (ref 0.7–3.1)
Lymphs: 21 %
MCH: 30.7 pg (ref 26.6–33.0)
MCHC: 33.8 g/dL (ref 31.5–35.7)
MCV: 91 fL (ref 79–97)
Monocytes Absolute: 0.5 10*3/uL (ref 0.1–0.9)
Monocytes: 5 %
Neutrophils Absolute: 6.9 10*3/uL (ref 1.4–7.0)
Neutrophils: 74 %
Platelets: 292 10*3/uL (ref 150–450)
RBC: 4.17 x10E6/uL (ref 3.77–5.28)
RDW: 11.9 % (ref 11.7–15.4)
RPR Ser Ql: NONREACTIVE
Rh Factor: POSITIVE
Rubella Antibodies, IGG: 1.25 index (ref >0.99)
WBC: 9.4 10*3/uL (ref 3.4–10.8)

## 2019-02-10 LAB — COMPREHENSIVE METABOLIC PANEL
ALT: 12 IU/L (ref 0–32)
AST: 20 IU/L (ref 0–40)
Albumin/Globulin Ratio: 2.1 (ref 1.2–2.2)
Albumin: 4.6 g/dL (ref 3.9–5.0)
Alkaline Phosphatase: 47 IU/L (ref 39–117)
BUN/Creatinine Ratio: 15 (ref 9–23)
BUN: 9 mg/dL (ref 6–20)
Bilirubin Total: 0.8 mg/dL (ref 0.0–1.2)
CO2: 21 mmol/L (ref 20–29)
Calcium: 9.3 mg/dL (ref 8.7–10.2)
Chloride: 101 mmol/L (ref 96–106)
Creatinine, Ser: 0.59 mg/dL (ref 0.57–1.00)
GFR calc Af Amer: 147 mL/min/{1.73_m2} (ref >59)
GFR calc non Af Amer: 128 mL/min/{1.73_m2} (ref >59)
Globulin, Total: 2.2 g/dL (ref 1.5–4.5)
Glucose: 78 mg/dL (ref 65–99)
Potassium: 4.4 mmol/L (ref 3.5–5.2)
Sodium: 137 mmol/L (ref 134–144)
Total Protein: 6.8 g/dL (ref 6.0–8.5)

## 2019-02-10 LAB — HEMOGLOBIN A1C
Est. average glucose Bld gHb Est-mCnc: 97 mg/dL
Hgb A1c MFr Bld: 5 % (ref 4.8–5.6)

## 2019-02-12 LAB — URINE CULTURE, OB REFLEX

## 2019-02-12 LAB — CULTURE, OB URINE

## 2019-02-13 ENCOUNTER — Encounter: Payer: Medicaid Other | Admitting: Obstetrics & Gynecology

## 2019-02-13 ENCOUNTER — Telehealth: Payer: Self-pay | Admitting: *Deleted

## 2019-02-13 ENCOUNTER — Encounter: Payer: Self-pay | Admitting: Obstetrics & Gynecology

## 2019-02-13 DIAGNOSIS — O2341 Unspecified infection of urinary tract in pregnancy, first trimester: Secondary | ICD-10-CM

## 2019-02-13 DIAGNOSIS — B951 Streptococcus, group B, as the cause of diseases classified elsewhere: Secondary | ICD-10-CM | POA: Insufficient documentation

## 2019-02-13 LAB — CERVICOVAGINAL ANCILLARY ONLY
Bacterial vaginitis: NEGATIVE
Candida vaginitis: NEGATIVE
Chlamydia: NEGATIVE
Neisseria Gonorrhea: NEGATIVE
Trichomonas: NEGATIVE

## 2019-02-13 MED ORDER — AMOXICILLIN 500 MG PO CAPS: 500.0000 mg | ORAL_CAPSULE | Freq: Three times a day (TID) | ORAL | 2 refills | Status: DC

## 2019-02-13 NOTE — Telephone Encounter (Signed)
Called pt to inform her of her UTI.  Pt did not pick up.  Left voicemail advising pt that she was being contacted regarding results and requesting she contact the clinic.

## 2019-02-13 NOTE — Addendum Note (Signed)
Addended by: Verita Schneiders A on: 02/13/2019 11:41 AM   Modules accepted: Orders

## 2019-02-13 NOTE — Telephone Encounter (Signed)
-----   Message from Osborne Oman, MD sent at 02/13/2019 11:42 AM EDT ----- Patient has GBS UTI, Amoxicillin prescribed.  Will also need treatment in labor.  Please call to inform patient of results and advise her to pick up prescription and take as directed.

## 2019-02-14 ENCOUNTER — Encounter: Payer: Self-pay | Admitting: *Deleted

## 2019-02-14 NOTE — Telephone Encounter (Signed)
Called and spoke with Pt. Informed her she has a GBS UTI and that antibiotics have been sent to her pharmacy and she needs to pick up and take as directed. Advised pt to take the ATB course in entirely. Discussed with pt that she will need to be treated for GBS during delivery. Pt voiced understanding and has no questions at this time.

## 2019-02-17 ENCOUNTER — Encounter: Payer: Self-pay | Admitting: *Deleted

## 2019-02-20 ENCOUNTER — Telehealth: Payer: Self-pay | Admitting: General Practice

## 2019-02-20 ENCOUNTER — Telehealth: Payer: Self-pay | Admitting: *Deleted

## 2019-02-20 ENCOUNTER — Encounter: Payer: Self-pay | Admitting: *Deleted

## 2019-02-20 DIAGNOSIS — O285 Abnormal chromosomal and genetic finding on antenatal screening of mother: Secondary | ICD-10-CM

## 2019-02-20 NOTE — Telephone Encounter (Signed)
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 I reviewed with Dr. Roselie Awkward and recommended offer genetic testing. Scheduled for first available appointment and called patient and explained received results and offered genetic counseling and explained they will explain in detail and answer her questions. She accepted appointment and voices understanding.

## 2019-02-20 NOTE — Telephone Encounter (Signed)
Patient called and left message on nurse voicemail line stating she is calling about her results. She states she cannot see them in [place].   Called patient, no answer- left message stating we are trying to reach you to return your phone call, please call us back if you still need assistance.

## 2019-02-21 ENCOUNTER — Encounter: Payer: Self-pay | Admitting: Obstetrics & Gynecology

## 2019-02-21 DIAGNOSIS — Z229 Carrier of infectious disease, unspecified: Secondary | ICD-10-CM | POA: Insufficient documentation

## 2019-02-21 DIAGNOSIS — Z148 Genetic carrier of other disease: Secondary | ICD-10-CM | POA: Insufficient documentation

## 2019-02-23 ENCOUNTER — Encounter: Payer: Self-pay | Admitting: *Deleted

## 2019-02-23 DIAGNOSIS — Z20828 Contact with and (suspected) exposure to other viral communicable diseases: Secondary | ICD-10-CM | POA: Diagnosis not present

## 2019-02-27 ENCOUNTER — Ambulatory Visit (HOSPITAL_COMMUNITY): Payer: Medicaid Other | Attending: Obstetrics and Gynecology

## 2019-03-02 ENCOUNTER — Other Ambulatory Visit: Payer: Self-pay | Admitting: General Practice

## 2019-03-02 DIAGNOSIS — O219 Vomiting of pregnancy, unspecified: Secondary | ICD-10-CM

## 2019-03-02 MED ORDER — ONDANSETRON HCL 8 MG PO TABS: 8.0000 mg | ORAL_TABLET | Freq: Three times a day (TID) | ORAL | 0 refills | Status: DC | PRN

## 2019-03-08 ENCOUNTER — Other Ambulatory Visit: Payer: Self-pay

## 2019-03-08 ENCOUNTER — Telehealth (INDEPENDENT_AMBULATORY_CARE_PROVIDER_SITE_OTHER): Payer: Medicaid Other | Admitting: Student

## 2019-03-08 DIAGNOSIS — Z3A14 14 weeks gestation of pregnancy: Secondary | ICD-10-CM

## 2019-03-08 DIAGNOSIS — B951 Streptococcus, group B, as the cause of diseases classified elsewhere: Secondary | ICD-10-CM

## 2019-03-08 DIAGNOSIS — O2341 Unspecified infection of urinary tract in pregnancy, first trimester: Secondary | ICD-10-CM

## 2019-03-08 DIAGNOSIS — Z3490 Encounter for supervision of normal pregnancy, unspecified, unspecified trimester: Secondary | ICD-10-CM

## 2019-03-08 NOTE — Progress Notes (Signed)
Patient ID: Julie Wilkinson, female   DOB: April 30, 1996, 23 y.o.   MRN: [id number]   I connected with@ on 03/08/19 at  8:55 AM EDT by: MyChart and verified that I am speaking with the correct person using two identifiers.  Patient is located at [facility] provider is located at [place] clinic.   The purpose of this virtual visit is to provide medical care while limiting exposure to the novel coronavirus. I discussed the limitations, risks, security and privacy concerns of performing an evaluation and management service by MyChart and the availability of in person appointments. I also discussed with the patient that there may be a patient responsible charge related to this service. By engaging in this virtual visit, you consent to the provision of healthcare.  Additionally, you authorize for your insurance to be billed for the services provided during this visit.  The patient expressed understanding and agreed to proceed.  The following staff members participated in the virtual visit:  Carver Fila.     PRENATAL VISIT NOTE  Subjective:  Julie Wilkinson is a 23 y.o. [id number] at [redacted]w[redacted]d  for phone visit for ongoing prenatal care.  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  Patient reports spotting a bit this morning, and yellow discharge with itching. No clumpy discharge, discharge has an odor but not fishy. Denies cramping or dysuria. She missed the last two doses of her antibiotic. Marland Kitchen  Contractions: Not present. Vag. Bleeding: Scant.  Movement: Present. Denies leaking of fluid.   The following portions of the patient's history were reviewed and updated as appropriate: allergies, current medications, past family  history, past medical history, past social history, past surgical history and problem list.   Objective:   Vitals:   03/08/19 0850  BP: 116/69  Pulse: 85   Self-Obtained  Fetal Status:     Movement: Present     Assessment and Plan:  Pregnancy: G4P1021 at [redacted]w[redacted]d 1. Group B Streptococcus urinary tract infection affecting pregnancy in first trimester  - Culture, OB Urine; Future  2. Encounter for supervision of low-risk pregnancy, antepartum -Patient thinks she has a yeast infection or BV; will do wet prep to check via nurse visit - Wet prep, genital; Future -Confirmed that she has [place] appt schedueled, and that she will have a virtual visit in 6 weeks.  -Reviewed baby scripts BPs; all normal. Patient has missed some BPs but she will start to check regularly.   Preterm labor symptoms and general obstetric precautions including but not limited to vaginal bleeding, contractions, leaking of fluid and fetal movement were reviewed in detail with the patient.  No follow-ups on file.  Future Appointments  Date Time Provider [place]  04/10/2019  1:00 PM [place] [facility]  04/10/2019  1:00 PM [place] 3 [facility]     Time spent on virtual visit: 20 minutes  Starr Lake, CNM

## 2019-03-08 NOTE — Patient Instructions (Signed)
Vaginal Yeast infection, Adult  Vaginal yeast infection is a condition that causes vaginal discharge as well as soreness, swelling, and redness (inflammation) of the vagina. This is a common condition. Some women get this infection frequently. What are the causes? This condition is caused by a change in the normal balance of the yeast (candida) and bacteria that live in the vagina. This change causes an overgrowth of yeast, which causes the inflammation. What increases the risk? The condition is more likely to develop in women who:  Take antibiotic medicines.  Have diabetes.  Take birth control pills.  Are pregnant.  Douche often.  Have a weak body defense system (immune system).  Have been taking steroid medicines for a long time.  Frequently wear tight clothing. What are the signs or symptoms? Symptoms of this condition include:  White, thick, creamy vaginal discharge.  Swelling, itching, redness, and irritation of the vagina. The lips of the vagina (vulva) may be affected as well.  Pain or a burning feeling while urinating.  Pain during sex. How is this diagnosed? This condition is diagnosed based on:  Your medical history.  A physical exam.  A pelvic exam. Your health care provider will examine a sample of your vaginal discharge under a microscope. Your health care provider may send this sample for testing to confirm the diagnosis. How is this treated? This condition is treated with medicine. Medicines may be over-the-counter or prescription. You may be told to use one or more of the following:  Medicine that is taken by mouth (orally).  Medicine that is applied as a cream (topically).  Medicine that is inserted directly into the vagina (suppository). Follow these instructions at home:  Lifestyle  Do not have sex until your health care provider approves. Tell your sex partner that you have a yeast infection. That person should go to his or her health care  provider and ask if they should also be treated.  Do not wear tight clothes, such as pantyhose or tight pants.  Wear breathable cotton underwear. General instructions  Take or apply over-the-counter and prescription medicines only as told by your health care provider.  Eat more yogurt. This may help to keep your yeast infection from returning.  Do not use tampons until your health care provider approves.  Try taking a sitz bath to help with discomfort. This is a warm water bath that is taken while you are sitting down. The water should only come up to your hips and should cover your buttocks. Do this 3-4 times per day or as told by your health care provider.  Do not douche.  If you have diabetes, keep your blood sugar levels under control.  Keep all follow-up visits as told by your health care provider. This is important. Contact a health care provider if:  You have a fever.  Your symptoms go away and then return.  Your symptoms do not get better with treatment.  Your symptoms get worse.  You have new symptoms.  You develop blisters in or around your vagina.  You have blood coming from your vagina and it is not your menstrual period.  You develop pain in your abdomen. Summary  Vaginal yeast infection is a condition that causes discharge as well as soreness, swelling, and redness (inflammation) of the vagina.  This condition is treated with medicine. Medicines may be over-the-counter or prescription.  Take or apply over-the-counter and prescription medicines only as told by your health care provider.  Do not douche.   Do not have sex or use tampons until your health care provider approves.  Contact a health care provider if your symptoms do not get better with treatment or your symptoms go away and then return. This information is not intended to replace advice given to you by your health care provider. Make sure you discuss any questions you have with your health care  provider. Document Released: 05/06/2005 Document Revised: 12/13/2017 Document Reviewed: 12/13/2017 Elsevier Patient Education  2020 Elsevier Inc.  

## 2019-03-08 NOTE — Progress Notes (Signed)
Pt states think she has BV, pt called yesterday to get an self swab scheduled, and has some scant bleeding this am.

## 2019-03-09 ENCOUNTER — Other Ambulatory Visit: Payer: Self-pay

## 2019-03-09 ENCOUNTER — Other Ambulatory Visit (HOSPITAL_COMMUNITY)
Admission: RE | Admit: 2019-03-09 | Discharge: 2019-03-09 | Disposition: A | Discharge status: Home / Self Care | Payer: Medicaid Other | Source: Ambulatory Visit | Attending: Family Medicine | Admitting: Family Medicine

## 2019-03-09 ENCOUNTER — Ambulatory Visit (INDEPENDENT_AMBULATORY_CARE_PROVIDER_SITE_OTHER): Payer: Medicaid Other

## 2019-03-09 DIAGNOSIS — O2342 Unspecified infection of urinary tract in pregnancy, second trimester: Secondary | ICD-10-CM

## 2019-03-09 DIAGNOSIS — B951 Streptococcus, group B, as the cause of diseases classified elsewhere: Secondary | ICD-10-CM | POA: Diagnosis not present

## 2019-03-09 DIAGNOSIS — N898 Other specified noninflammatory disorders of vagina: Secondary | ICD-10-CM | POA: Diagnosis not present

## 2019-03-09 NOTE — Progress Notes (Signed)
Pt here today for [place]/o vaginal discharge and per chart review Maye Hides, CNM wants pt's urine to be sent for urine culture to make sure that GBS bacteria is gone.  Pt explained how to obtain self swab and that results will come back in 24-48 hrs.  I also informed pt that we will call her with abnormal results.  OB urine cx sent.  Pt verbalized understanding with no further questions.   Mel Almond, RN 03/09/19

## 2019-03-09 NOTE — Progress Notes (Signed)
I agree with the nurses note and documentation.   Noni Saupe I, NP 03/09/2019 5:05 PM

## 2019-03-11 LAB — CERVICOVAGINAL ANCILLARY ONLY
Bacterial vaginitis: NEGATIVE
Candida vaginitis: NEGATIVE
Chlamydia: NEGATIVE
Neisseria Gonorrhea: NEGATIVE
Trichomonas: NEGATIVE

## 2019-03-14 DIAGNOSIS — Z3490 Encounter for supervision of normal pregnancy, unspecified, unspecified trimester: Secondary | ICD-10-CM | POA: Diagnosis not present

## 2019-03-14 DIAGNOSIS — Z331 Pregnant state, incidental: Secondary | ICD-10-CM | POA: Diagnosis not present

## 2019-03-15 ENCOUNTER — Other Ambulatory Visit: Payer: Self-pay | Admitting: Student

## 2019-03-15 LAB — URINE CULTURE, OB REFLEX

## 2019-03-15 LAB — CULTURE, OB URINE

## 2019-03-20 ENCOUNTER — Telehealth: Payer: Self-pay | Admitting: Student

## 2019-03-20 NOTE — Telephone Encounter (Signed)
Called patient and left a message telling her that I need to the severity of her reaction to Loracarbef in order to appropriately prescribed her an antibiotic for her UTI. Will try to reach patient again this week.  Julie Wilkinson

## 2019-03-21 ENCOUNTER — Other Ambulatory Visit: Payer: Self-pay | Admitting: Student

## 2019-03-21 ENCOUNTER — Telehealth (INDEPENDENT_AMBULATORY_CARE_PROVIDER_SITE_OTHER): Payer: Medicaid Other | Admitting: Lactation Services

## 2019-03-21 ENCOUNTER — Telehealth: Payer: Self-pay

## 2019-03-21 DIAGNOSIS — O2342 Unspecified infection of urinary tract in pregnancy, second trimester: Secondary | ICD-10-CM

## 2019-03-21 DIAGNOSIS — B951 Streptococcus, group B, as the cause of diseases classified elsewhere: Secondary | ICD-10-CM

## 2019-03-21 MED ORDER — CEPHALEXIN 500 MG PO CAPS: 500.0000 mg | ORAL_CAPSULE | Freq: Four times a day (QID) | ORAL | 0 refills | Status: DC

## 2019-03-21 NOTE — Telephone Encounter (Signed)
Pt called requesting an antibiotic for her testing for GBS.  Per chart review, Keflex was prescribed to treat UTI.

## 2019-03-21 NOTE — Telephone Encounter (Signed)
Called pt to inform her that Keflex has been ordered for her. She is to call with any reactions if needed. Pt voiced understanding. Advised pt to take all medications as prescribed unless instructed otherwise. Pt voiced understanding.

## 2019-03-28 ENCOUNTER — Telehealth: Payer: Self-pay | Admitting: *Deleted

## 2019-03-28 NOTE — Telephone Encounter (Signed)
Returned call from 03/28/19 at 12:12pm. Not able to leave a message, mailbox is full.

## 2019-03-31 ENCOUNTER — Other Ambulatory Visit: Payer: Self-pay

## 2019-03-31 DIAGNOSIS — O219 Vomiting of pregnancy, unspecified: Secondary | ICD-10-CM

## 2019-03-31 MED ORDER — PROMETHAZINE HCL 25 MG PO TABS: 25.0000 mg | ORAL_TABLET | Freq: Four times a day (QID) | ORAL | 0 refills | Status: DC | PRN

## 2019-04-04 ENCOUNTER — Other Ambulatory Visit: Payer: Self-pay | Admitting: Lactation Services

## 2019-04-04 MED ORDER — ONDANSETRON HCL 8 MG PO TABS: 8.0000 mg | ORAL_TABLET | Freq: Three times a day (TID) | ORAL | 1 refills | Status: DC | PRN

## 2019-04-04 NOTE — Progress Notes (Signed)
Zofran reordered per Eusebio Me, CNM at request of pt due to nausea

## 2019-04-07 ENCOUNTER — Telehealth: Payer: Self-pay | Admitting: Family Medicine

## 2019-04-07 NOTE — Telephone Encounter (Signed)
Attempted to contact patient about her appointment on 8/31 @ 2:00. No answer and could not leave a message because the mailbox was full.

## 2019-04-10 ENCOUNTER — Ambulatory Visit (HOSPITAL_COMMUNITY): Payer: Medicaid Other | Admitting: *Deleted

## 2019-04-10 ENCOUNTER — Other Ambulatory Visit (HOSPITAL_COMMUNITY): Payer: Self-pay | Admitting: *Deleted

## 2019-04-10 ENCOUNTER — Ambulatory Visit (HOSPITAL_COMMUNITY)
Admission: RE | Admit: 2019-04-10 | Discharge: 2019-04-10 | Disposition: A | Discharge status: Home / Self Care | Payer: Medicaid Other | Source: Ambulatory Visit | Attending: Obstetrics and Gynecology | Admitting: Obstetrics and Gynecology

## 2019-04-10 ENCOUNTER — Ambulatory Visit (INDEPENDENT_AMBULATORY_CARE_PROVIDER_SITE_OTHER): Payer: Medicaid Other | Admitting: General Practice

## 2019-04-10 ENCOUNTER — Other Ambulatory Visit: Payer: Self-pay

## 2019-04-10 ENCOUNTER — Encounter (HOSPITAL_COMMUNITY): Payer: Self-pay | Admitting: *Deleted

## 2019-04-10 VITALS — BP 106/59 | HR 77 | Ht 64.0 in | Wt 148.0 lb

## 2019-04-10 DIAGNOSIS — Z363 Encounter for antenatal screening for malformations: Secondary | ICD-10-CM

## 2019-04-10 DIAGNOSIS — B951 Streptococcus, group B, as the cause of diseases classified elsewhere: Secondary | ICD-10-CM | POA: Insufficient documentation

## 2019-04-10 DIAGNOSIS — Z362 Encounter for other antenatal screening follow-up: Secondary | ICD-10-CM

## 2019-04-10 DIAGNOSIS — Z3490 Encounter for supervision of normal pregnancy, unspecified, unspecified trimester: Secondary | ICD-10-CM | POA: Diagnosis not present

## 2019-04-10 DIAGNOSIS — O2342 Unspecified infection of urinary tract in pregnancy, second trimester: Secondary | ICD-10-CM

## 2019-04-10 DIAGNOSIS — Z148 Genetic carrier of other disease: Secondary | ICD-10-CM

## 2019-04-10 DIAGNOSIS — Z3A18 18 weeks gestation of pregnancy: Secondary | ICD-10-CM | POA: Diagnosis not present

## 2019-04-10 DIAGNOSIS — Z23 Encounter for immunization: Secondary | ICD-10-CM

## 2019-04-10 DIAGNOSIS — Z3492 Encounter for supervision of normal pregnancy, unspecified, second trimester: Secondary | ICD-10-CM

## 2019-04-10 NOTE — Progress Notes (Signed)
Chart reviewed - agree with RN documentation.   

## 2019-04-10 NOTE — Progress Notes (Signed)
Azalee Course here for the flu vaccine.  Injection administered without complication. Patient will return on September 8 for OB visit.  Derinda Late, RN 04/10/2019  3:24 PM

## 2019-04-18 ENCOUNTER — Other Ambulatory Visit: Payer: Self-pay

## 2019-04-18 ENCOUNTER — Telehealth: Payer: Medicaid Other | Admitting: Student

## 2019-04-18 NOTE — Progress Notes (Signed)
Pt did not keep appt. Can reschedule.   Jorje Guild, NP

## 2019-04-18 NOTE — Progress Notes (Signed)
@  322pm called pt lvm that I will call back in 7 mins to try her again.  @330pm  phone went straight to voicemail. Did not leave message. Will try in 5 mins.

## 2019-04-19 ENCOUNTER — Telehealth: Payer: Medicaid Other | Admitting: Medical

## 2019-04-19 ENCOUNTER — Telehealth: Payer: Medicaid Other | Admitting: Family

## 2019-04-19 DIAGNOSIS — R399 Unspecified symptoms and signs involving the genitourinary system: Secondary | ICD-10-CM

## 2019-04-19 DIAGNOSIS — O2342 Unspecified infection of urinary tract in pregnancy, second trimester: Secondary | ICD-10-CM

## 2019-04-19 DIAGNOSIS — B951 Streptococcus, group B, as the cause of diseases classified elsewhere: Secondary | ICD-10-CM

## 2019-04-19 DIAGNOSIS — Z3490 Encounter for supervision of normal pregnancy, unspecified, unspecified trimester: Secondary | ICD-10-CM

## 2019-04-19 DIAGNOSIS — R109 Unspecified abdominal pain: Secondary | ICD-10-CM

## 2019-04-19 NOTE — Progress Notes (Signed)
Based on what you shared with me, I feel your condition warrants further evaluation and I recommend that you be seen for a face to face office visit.  Given your symptoms of UTI with back pain and you are pregnant, I recommend that you call your GYN and follow up with them to rule out a more serious infection.   NOTE: If you entered your credit card information for this eVisit, you will not be charged. You may see a "hold" on your card for the $35 but that hold will drop off and you will not have a charge processed.  If you are having a true medical emergency please call 911.     For an urgent face to face visit, [place] has four urgent care centers for your convenience:   . [facility]    [phone]                  Get Driving Directions  [address], [place] [address] . 10 am to 8 pm Monday-Friday . 12 pm to 8 pm Saturday-Sunday   . [facility] at [place]                  Get Driving Directions  [address], [place], [place] [address] . 8 am to 8 pm Monday-Friday . 9 am to 6 pm Saturday . 11 am to 6 pm Sunday   . [facility] at [place]                  Get Driving Directions   [address], [place] [address] . 8 am to 8 pm Monday-Friday . 8 am to 4 pm Saturday-Sunday    . [facility] at [place]                    Get Driving Directions  [phone]  [address] [place], [place] [address]  . Monday-Friday, 12 PM to 6 PM    Your e-visit answers were reviewed by a board certified advanced clinical practitioner to complete your personal care plan.  Thank you for using e-Visits.

## 2019-05-02 ENCOUNTER — Encounter: Payer: Medicaid Other | Admitting: Student

## 2019-05-08 ENCOUNTER — Ambulatory Visit (HOSPITAL_COMMUNITY): Payer: Medicaid Other

## 2019-05-08 ENCOUNTER — Other Ambulatory Visit: Payer: Self-pay

## 2019-05-08 ENCOUNTER — Ambulatory Visit (HOSPITAL_COMMUNITY)
Admission: RE | Admit: 2019-05-08 | Discharge: 2019-05-08 | Disposition: A | Discharge status: Home / Self Care | Payer: Medicaid Other | Source: Ambulatory Visit | Attending: Maternal & Fetal Medicine | Admitting: Maternal & Fetal Medicine

## 2019-05-08 DIAGNOSIS — Z148 Genetic carrier of other disease: Secondary | ICD-10-CM

## 2019-05-08 DIAGNOSIS — Z362 Encounter for other antenatal screening follow-up: Secondary | ICD-10-CM | POA: Insufficient documentation

## 2019-05-08 DIAGNOSIS — Z3A22 22 weeks gestation of pregnancy: Secondary | ICD-10-CM | POA: Diagnosis not present

## 2019-05-16 ENCOUNTER — Other Ambulatory Visit: Payer: Self-pay

## 2019-05-16 ENCOUNTER — Telehealth: Payer: Medicaid Other | Admitting: Student

## 2019-05-16 DIAGNOSIS — Z91199 Patient's noncompliance with other medical treatment and regimen due to unspecified reason: Secondary | ICD-10-CM

## 2019-05-16 DIAGNOSIS — Z5329 Procedure and treatment not carried out because of patient's decision for other reasons: Secondary | ICD-10-CM

## 2019-05-16 NOTE — Progress Notes (Signed)
@  157pm lvm for appointment will try in 10 mins@ @208pm  no answer lvm that we are calling for appointment will try back at appointment time. @227  no answer

## 2019-05-17 ENCOUNTER — Encounter: Payer: Self-pay | Admitting: Medical

## 2019-05-17 ENCOUNTER — Other Ambulatory Visit: Payer: Self-pay

## 2019-05-17 ENCOUNTER — Telehealth: Payer: Self-pay | Admitting: Medical

## 2019-05-17 ENCOUNTER — Encounter: Payer: Medicaid Other | Admitting: Medical

## 2019-05-17 NOTE — Telephone Encounter (Signed)
Attempted to call patient to get her rescheduled for her missed OB appointment. No answer, left voicemail to give the office a call back. No show letter mailed.

## 2019-05-17 NOTE — Patient Instructions (Signed)
Please reschedule your appointment as soon as possible 

## 2019-05-17 NOTE — Progress Notes (Signed)
4:31p-Called pt for My Chart visit no answer, left VM that I will call hger back in 10 minutes.    4:44P-  2ND attempt no answer, left vm that she will have to be rescheduled.

## 2019-05-24 DIAGNOSIS — F319 Bipolar disorder, unspecified: Secondary | ICD-10-CM | POA: Diagnosis not present

## 2019-05-30 ENCOUNTER — Other Ambulatory Visit: Payer: Self-pay

## 2019-05-30 ENCOUNTER — Telehealth: Payer: Medicaid Other | Admitting: Student

## 2019-05-30 NOTE — Progress Notes (Signed)
Pt did not keep virtual appt today. Will reschedule in the next 2 weeks & needs 28 wk labs.

## 2019-05-30 NOTE — Progress Notes (Signed)
@  1035am No answer  LVM that I was calling for appointment and iu will call back in 5 mins. If she happens to miss my call again to call and reschedule.

## 2019-05-31 ENCOUNTER — Other Ambulatory Visit: Payer: Self-pay

## 2019-05-31 ENCOUNTER — Encounter: Payer: Self-pay | Admitting: Medical

## 2019-05-31 ENCOUNTER — Encounter: Payer: Medicaid Other | Admitting: Medical

## 2019-05-31 NOTE — Progress Notes (Signed)
@  1005am Left Voicemail that we are trying to reach her for her appointment and we will call her back in 5 mins to get her checked in for her appointment. Left a mychart message today advising patient to be available by phone no response yet.  @1015am  LVM to call the office back to be rescheduled and to let us know via mychart or by calling the office if she would rather be in person considering she cannot be reached by phone.

## 2019-05-31 NOTE — Patient Instructions (Signed)
Please reschedule as soon as possible  

## 2019-06-02 ENCOUNTER — Other Ambulatory Visit: Payer: Self-pay

## 2019-06-02 DIAGNOSIS — Z3492 Encounter for supervision of normal pregnancy, unspecified, second trimester: Secondary | ICD-10-CM

## 2019-06-02 NOTE — Progress Notes (Signed)
Orders placed for mfm

## 2019-06-07 ENCOUNTER — Ambulatory Visit (HOSPITAL_COMMUNITY): Payer: Medicaid Other | Admitting: *Deleted

## 2019-06-07 ENCOUNTER — Encounter: Payer: Medicaid Other | Admitting: Women's Health

## 2019-06-07 ENCOUNTER — Encounter (HOSPITAL_COMMUNITY): Payer: Self-pay | Admitting: *Deleted

## 2019-06-07 ENCOUNTER — Ambulatory Visit (HOSPITAL_COMMUNITY): Payer: Medicaid Other | Attending: Obstetrics and Gynecology | Admitting: Obstetrics

## 2019-06-07 ENCOUNTER — Other Ambulatory Visit: Payer: Self-pay

## 2019-06-07 ENCOUNTER — Encounter: Payer: Self-pay | Admitting: Obstetrics and Gynecology

## 2019-06-07 ENCOUNTER — Ambulatory Visit (INDEPENDENT_AMBULATORY_CARE_PROVIDER_SITE_OTHER): Payer: Medicaid Other | Admitting: Obstetrics and Gynecology

## 2019-06-07 VITALS — BP 112/49 | HR 92 | Temp 98.2°F | Wt 152.1 lb

## 2019-06-07 DIAGNOSIS — Z3492 Encounter for supervision of normal pregnancy, unspecified, second trimester: Secondary | ICD-10-CM

## 2019-06-07 DIAGNOSIS — Z3A27 27 weeks gestation of pregnancy: Secondary | ICD-10-CM

## 2019-06-07 DIAGNOSIS — F419 Anxiety disorder, unspecified: Secondary | ICD-10-CM | POA: Insufficient documentation

## 2019-06-07 DIAGNOSIS — B951 Streptococcus, group B, as the cause of diseases classified elsewhere: Secondary | ICD-10-CM | POA: Diagnosis not present

## 2019-06-07 DIAGNOSIS — F4310 Post-traumatic stress disorder, unspecified: Secondary | ICD-10-CM | POA: Insufficient documentation

## 2019-06-07 DIAGNOSIS — Z23 Encounter for immunization: Secondary | ICD-10-CM | POA: Diagnosis not present

## 2019-06-07 DIAGNOSIS — O2342 Unspecified infection of urinary tract in pregnancy, second trimester: Secondary | ICD-10-CM

## 2019-06-07 DIAGNOSIS — O99342 Other mental disorders complicating pregnancy, second trimester: Secondary | ICD-10-CM | POA: Diagnosis not present

## 2019-06-07 DIAGNOSIS — O98812 Other maternal infectious and parasitic diseases complicating pregnancy, second trimester: Secondary | ICD-10-CM

## 2019-06-07 DIAGNOSIS — A749 Chlamydial infection, unspecified: Secondary | ICD-10-CM

## 2019-06-07 DIAGNOSIS — F319 Bipolar disorder, unspecified: Secondary | ICD-10-CM | POA: Diagnosis not present

## 2019-06-07 NOTE — Progress Notes (Signed)
   PRENATAL VISIT NOTE  Subjective:  Julie Wilkinson is a 23 y.o. G4P1021 at [redacted]w[redacted]d being seen today for ongoing prenatal care.  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  Patient reports no complaints.  Contractions: Not present. Vag. Bleeding: None.  Movement: Present. Denies leaking of fluid.   The following portions of the patient's history were reviewed and updated as appropriate: allergies, current medications, past family history, past medical history, past social history, past surgical history and problem list.   Objective:   Vitals:   06/07/19 1622  BP: (!) 112/49  Pulse: 92  Temp: 98.2 F (36.8 C)  Weight: 152 lb 1.6 oz (69 kg)   Fetal Status: Fetal Heart Rate (bpm): 148   Movement: Present     General:  Alert, oriented and cooperative. Patient is in no acute distress.  Skin: Skin is warm and dry. No rash noted.   Cardiovascular: Normal heart rate noted  Respiratory: Normal respiratory effort, no problems with respiration noted  Abdomen: Soft, gravid, appropriate for gestational age.  Pain/Pressure: Present     Pelvic: Cervical exam deferred        Extremities: Normal range of motion.  Edema: Trace  Mental Status: Normal mood and affect. Normal behavior. Normal judgment and thought content.   Assessment and Plan:  Pregnancy: G4P1021 at [redacted]w[redacted]d  1. Encounter for supervision of low-risk pregnancy in second trimester Scheduled for 3rd trim labs for 06/15/19 - Tdap vaccine greater than or equal to 7yo IM  2. Chlamydia infection affecting pregnancy in second trimester Neg TOC  3. Group B Streptococcus urinary tract infection affecting pregnancy  in second trimester - Urine Culture  4. Bipolar 1 disorder ([place]) - S/p MFM consult for recent diagnosis - Lithium and risperdone  Preterm labor symptoms and general obstetric precautions including but not limited to vaginal bleeding, contractions, leaking of fluid and fetal movement were reviewed in detail with the patient. Please refer to After Visit Summary for other counseling recommendations.   Return in about 2 weeks (around 06/21/2019) for in person, high OB.  Future Appointments  Date Time Provider [place]  06/15/2019  9:15 AM Emily Filbert, MD [facility]  06/15/2019 10:00 AM [facility] LAB [facility]    Sloan Leiter, MD

## 2019-06-08 ENCOUNTER — Other Ambulatory Visit: Payer: Self-pay | Admitting: Lactation Services

## 2019-06-08 DIAGNOSIS — Z3492 Encounter for supervision of normal pregnancy, unspecified, second trimester: Secondary | ICD-10-CM

## 2019-06-08 LAB — URINE CULTURE

## 2019-06-08 NOTE — Progress Notes (Signed)
MFM Consult  This patient is a 23 year old gravida 4 para 1-0-2-1 who is currently 27 weeks 2 days pregnant.  She was referred for consultation as her mental health providers have recommended that she be started on lithium and risperidone for treatment of her bipolar disorder.  The patient reports that she has a history of PTSD, anxiety, and depression.  She has been treated with citalopram and other medications in the past which have not provided her with therapeutic treatment of her mental issues.  She reports that she was recently seen by Marcelino Duster at [facility] behavioral health services in [place] who diagnosed her as having bipolar disorder and recommended that she be placed on lithium and risperidone for treatment.  The patient reports that she does go through periods of depression and mania.  She reports that her mood is stable currently.  The patient has already had two prenatal ultrasound performed in our office that did not reveal any fetal abnormalities or congenital heart defects.  The patient reports 1 prior full-term normal spontaneous vaginal delivery at 37 weeks.  She denies any other significant medical issues.  She denies any prior surgeries. She denies any illegal drug use.    The patient was advised that lithium treatment in in the first trimester of pregnancy has been associated with an increased risk of cardiac defects especially the Ebstein's anomaly.  The patient was advised that as she will be entering her third trimester of pregnancy soon and as she has had a normal fetal anatomy scan performed in our office which did not reveal any fetal anomalies or cardiac defects, that her baby's risk of a congenital birth defect from lithium treatment should be very low.  She was advised that there is limited data regarding the use of risperidone treatment in pregnancy.  Most reports have not indicated an increased risk of congenital birth defects and risperidone treatment in  pregnancy.  Ms. Hunt was advised that she should be started on lithium and risperidone in pregnancy if the benefits of the medication outweighs the risk.  As the patient states that it would be difficult for her to perform daily functions when she is in her depressed or manic moods, she was advised that based on the low risk for birth defects at her current gestational age for treatment with lithium or risperidone, that she may be started on both of these medications as soon as possible at the dosage recommended by her mental health providers.  The patient should have her lithium levels monitored after starting the medication as the physiologic effects of pregnancy may affect lithium levels.  The patient should probably not breast-feed while taking lithium as high lithium levels in breastmilk may produce toxicity in the baby.  For reassurance, the patient should have a growth ultrasound scheduled in our office about 5 weeks after being started on these medications.  At the end of the consultation the patient stated that all her questions have been answered to her complete satisfaction.  Hopefully with treatment, her mental health issues will be successfully managed.    Thank you for referring this very nice patient for Maternal-Fetal Medicine consultation.  A total of 30 minutes was spent counseling and coordinating the care for this patient.  Greater than 50% of the time was spent in direct face-to-face contact.

## 2019-06-13 ENCOUNTER — Other Ambulatory Visit: Payer: Medicaid Other

## 2019-06-13 ENCOUNTER — Encounter (HOSPITAL_COMMUNITY): Payer: Self-pay

## 2019-06-13 ENCOUNTER — Telehealth: Payer: Self-pay | Admitting: Lactation Services

## 2019-06-13 ENCOUNTER — Other Ambulatory Visit: Payer: Self-pay

## 2019-06-13 ENCOUNTER — Encounter: Payer: Medicaid Other | Admitting: Advanced Practice Midwife

## 2019-06-13 ENCOUNTER — Inpatient Hospital Stay (HOSPITAL_COMMUNITY)
Admission: AD | Admit: 2019-06-13 | Discharge: 2019-06-13 | Disposition: A | Discharge status: Home / Self Care | Payer: Medicaid Other | Attending: Obstetrics & Gynecology | Admitting: Obstetrics & Gynecology

## 2019-06-13 ENCOUNTER — Encounter: Payer: Medicaid Other | Admitting: Obstetrics and Gynecology

## 2019-06-13 DIAGNOSIS — O2342 Unspecified infection of urinary tract in pregnancy, second trimester: Secondary | ICD-10-CM | POA: Diagnosis not present

## 2019-06-13 DIAGNOSIS — Z3A28 28 weeks gestation of pregnancy: Secondary | ICD-10-CM | POA: Diagnosis not present

## 2019-06-13 DIAGNOSIS — B951 Streptococcus, group B, as the cause of diseases classified elsewhere: Secondary | ICD-10-CM

## 2019-06-13 DIAGNOSIS — Z0371 Encounter for suspected problem with amniotic cavity and membrane ruled out: Secondary | ICD-10-CM | POA: Diagnosis not present

## 2019-06-13 DIAGNOSIS — Z3492 Encounter for supervision of normal pregnancy, unspecified, second trimester: Secondary | ICD-10-CM

## 2019-06-13 DIAGNOSIS — Z3689 Encounter for other specified antenatal screening: Secondary | ICD-10-CM

## 2019-06-13 LAB — URINALYSIS, ROUTINE W REFLEX MICROSCOPIC
Bilirubin Urine: NEGATIVE
Glucose, UA: 50 mg/dL — AB
Hgb urine dipstick: NEGATIVE
Ketones, ur: NEGATIVE mg/dL
Nitrite: NEGATIVE
Protein, ur: NEGATIVE mg/dL
Specific Gravity, Urine: 1.003 — ABNORMAL LOW (ref 1.005–1.030)
pH: 6 (ref 5.0–8.0)

## 2019-06-13 LAB — WET PREP, GENITAL
Clue Cells Wet Prep HPF POC: NONE SEEN
Sperm: NONE SEEN
Trich, Wet Prep: NONE SEEN
Yeast Wet Prep HPF POC: NONE SEEN

## 2019-06-13 LAB — AMNISURE RUPTURE OF MEMBRANE (ROM) NOT AT ARMC: Amnisure ROM: NEGATIVE

## 2019-06-13 NOTE — Discharge Instructions (Signed)

## 2019-06-13 NOTE — Telephone Encounter (Signed)
Called pt in response to her My Chart message that she has had a gush of fluids and cramping this morning.   Pt did not answer. LM telling pt to go to the Maternity Assessment Unit at the [facility] for evaluation ASAP.   Will send My chart message also.

## 2019-06-13 NOTE — MAU Provider Note (Signed)
History     CSN: [id number]  Arrival date and time: 06/13/19 1559   First Provider Initiated Contact with Patient 06/13/19 1641     Chief Complaint  Patient presents with  . possible rupture of membranes   HPI  Julie Wilkinson is a 23 y.o. [id number] at 12w1dwho presents to MAU with concern for premature rupture of membranes this morning between 11 and 11:30am. Patient endorses continuous leaking of fluid since that time.   She also reports bilateral lower abdominal cramping, new onset a few hours after her gush of fluid. She rates her abdominal cramping pain as 3/10. She denies aggravating or alleviating factors. She has not taken medication for this complaint and declines medication upon arrival in MAU.  Sexual intercourse late last night/early this morning. She denies vaginal bleeding, dysuria, flank pain, decreased fetal movement, fever, falls, or recent illness.   OB History    Gravida  4   Para  1   Term  1   Preterm      AB  2   Living  1     SAB  1   TAB  1   Ectopic      Multiple      Live Births  1           Past Medical History:  Diagnosis Date  . Anxiety 2017    Past Surgical History:  Procedure Laterality Date  . HYMENECTOMY  23years old    Family History  Problem Relation Age of Onset  . Bipolar disorder Mother   . Depression Father   . Bipolar disorder Sister     Social History   Tobacco Use  . Smoking status: Former Smoker    Types: Cigarettes    Quit date: 2015    Years since quitting: 5.8  . Smokeless tobacco: Never Used  Substance Use Topics  . Alcohol use: Not Currently  . Drug use: No    Allergies:  Allergies  Allergen Reactions  . Loracarbef     Medications Prior to Admission  Medication Sig Dispense Refill Last Dose  . AMBULATORY NON FORMULARY MEDICATION 1 Device by Other route once a week. Blood pressure cuff/ regular  Monitored regularly at home ICD 10: Z34.90 1 kit 0 Past Month at Unknown time  .  ondansetron (ZOFRAN) 8 MG tablet Take 1 tablet (8 mg total) by mouth every 8 (eight) hours as needed for nausea or vomiting. 20 tablet 1 Past Month at Unknown time  . Prenatal Vit-Fe Fumarate-FA (MULTIVITAMIN-PRENATAL) 27-0.8 MG TABS tablet Take 1 tablet by mouth daily at 12 noon.   06/13/2019 at Unknown time  . cephALEXin (KEFLEX) 500 MG capsule Take 1 capsule (500 mg total) by mouth 4 (four) times daily. (Patient not taking: Reported on 04/10/2019) 28 capsule 0   . ondansetron (ZOFRAN) 8 MG tablet Take 1 tablet (8 mg total) by mouth every 8 (eight) hours as needed for nausea or vomiting. 30 tablet 0   . promethazine (PHENERGAN) 25 MG tablet Take 1 tablet (25 mg total) by mouth every 6 (six) hours as needed for nausea or vomiting. (Patient not taking: Reported on 06/07/2019) 30 tablet 0     Review of Systems  Constitutional: Negative for fever.  Respiratory: Negative for shortness of breath.   Gastrointestinal: Positive for abdominal pain.  Genitourinary: Positive for vaginal discharge. Negative for difficulty urinating, dysuria and vaginal bleeding.  Musculoskeletal: Negative for back pain.  All other systems reviewed and are  negative.  Physical Exam   Blood pressure (!) 108/58, pulse 90, temperature 98.1 F (36.7 C), temperature source Oral, resp. rate 17, height 5' 4"  (1.626 m), weight 68 kg, last menstrual period 11/28/2018, SpO2 100 %, unknown if currently breastfeeding.  Physical Exam  Nursing note and vitals reviewed. Constitutional: She is oriented to person, place, and time. She appears well-developed and well-nourished.  Cardiovascular: Normal rate.  Respiratory: Effort normal.  GI: Soft. She exhibits no distension. There is no abdominal tenderness. There is no rebound and no guarding.  Gravid  Genitourinary:    No vaginal discharge.     Genitourinary Comments: Cervix visually closed on SSE. Confirmed with digital exam. No CMT. Scant yellow-green discharge proximal to cervical  os.   Neurological: She is alert and oriented to person, place, and time.  Skin: Skin is warm and dry.  Psychiatric: She has a normal mood and affect. Her behavior is normal. Judgment and thought content normal.    MAU Course/MDM  Procedures: sterile speculum exam  --FFN not collected due to recent intercourse --Negative pooling, negative amnisure --Reactive tracing: baseline 145, moderate variability, positive accels, no decels --[place]: rare contractions, UI  Patient Vitals for the past 24 hrs:  BP Temp Temp src Pulse Resp SpO2 Height Weight  06/13/19 1751 105/63 - - 80 - - - -  06/13/19 1750 105/63 98.4 F (36.9 C) Oral 81 18 98 % - -  06/13/19 1624 (!) 108/58 98.1 F (36.7 C) Oral 90 17 100 % 5' 4"  (1.626 m) 68 kg   Results for orders placed or performed during the hospital encounter of 06/13/19 (from the past 24 hour(s))  Wet prep, genital     Status: Abnormal   Collection Time: 06/13/19  4:43 PM  Result Value Ref Range   Yeast Wet Prep HPF POC NONE SEEN NONE SEEN   Trich, Wet Prep NONE SEEN NONE SEEN   Clue Cells Wet Prep HPF POC NONE SEEN NONE SEEN   WBC, Wet Prep HPF POC MANY (A) NONE SEEN   Sperm NONE SEEN   Amnisure rupture of membrane (rom)not at [facility]     Status: None   Collection Time: 06/13/19  4:43 PM  Result Value Ref Range   Amnisure ROM NEGATIVE   Urinalysis, Routine w reflex microscopic     Status: Abnormal   Collection Time: 06/13/19  4:43 PM  Result Value Ref Range   Color, Urine STRAW (A) YELLOW   APPearance CLEAR CLEAR   Specific Gravity, Urine 1.003 (L) 1.005 - 1.030   pH 6.0 5.0 - 8.0   Glucose, UA 50 (A) NEGATIVE mg/dL   Hgb urine dipstick NEGATIVE NEGATIVE   Bilirubin Urine NEGATIVE NEGATIVE   Ketones, ur NEGATIVE NEGATIVE mg/dL   Protein, ur NEGATIVE NEGATIVE mg/dL   Nitrite NEGATIVE NEGATIVE   Leukocytes,Ua LARGE (A) NEGATIVE   RBC / HPF 0-5 0 - 5 RBC/hpf   WBC, UA 6-10 0 - 5 WBC/hpf   Bacteria, UA RARE (A) NONE SEEN   Squamous Epithelial  / LPF 0-5 0 - 5   Mucus PRESENT     Assessment and Plan  --23 y.o. [id number] at [redacted]w[redacted]d --Reactive tracing --Intact amniotic sac --Closed cervix --Patient declined pain medication at introduction and again prior to discharge --Urine culture in work for large Leuks, 6-10 WBCs --Discharge home in stable condition  [facility] 06/15/2019  SDarlina Rumpf CNM 06/13/2019, 8:23 PM

## 2019-06-13 NOTE — MAU Note (Signed)
.   Julie Wilkinson is a 23 y.o. at [redacted]w[redacted]d here in MAU reporting: possible rupture of membranes at 1130 am and constant leaking since the initial gush. She also reports green mucous right after the gush of fluid but no green mucous since then. She states the fluid she is having now is clear. She reports spotting after the fluid gush and has not had any VB since. Reports + fetal movement.  Pain score: 3 Vitals:   06/13/19 1624  BP: (!) 108/58  Pulse: 90  Resp: 17  Temp: 98.1 F (36.7 C)  SpO2: 100%     FHT:148

## 2019-06-14 DIAGNOSIS — Z3493 Encounter for supervision of normal pregnancy, unspecified, third trimester: Secondary | ICD-10-CM

## 2019-06-14 LAB — CULTURE, OB URINE: Culture: <10000 — AB

## 2019-06-14 LAB — GC/CHLAMYDIA PROBE AMP (~~LOC~~) NOT AT ARMC
Chlamydia: POSITIVE — AB
Comment: NEGATIVE
Comment: NORMAL
Neisseria Gonorrhea: NEGATIVE

## 2019-06-14 MED ORDER — ONDANSETRON HCL 8 MG PO TABS: 8.0000 mg | ORAL_TABLET | Freq: Three times a day (TID) | ORAL | 0 refills | Status: DC | PRN

## 2019-06-15 ENCOUNTER — Other Ambulatory Visit: Payer: Self-pay | Admitting: Advanced Practice Midwife

## 2019-06-15 ENCOUNTER — Ambulatory Visit (INDEPENDENT_AMBULATORY_CARE_PROVIDER_SITE_OTHER): Payer: Medicaid Other | Admitting: Obstetrics & Gynecology

## 2019-06-15 ENCOUNTER — Other Ambulatory Visit: Payer: Medicaid Other

## 2019-06-15 ENCOUNTER — Other Ambulatory Visit: Payer: Self-pay

## 2019-06-15 ENCOUNTER — Encounter: Payer: Medicaid Other | Admitting: Obstetrics and Gynecology

## 2019-06-15 VITALS — BP 101/64 | HR 76 | Wt 157.3 lb

## 2019-06-15 DIAGNOSIS — Z3492 Encounter for supervision of normal pregnancy, unspecified, second trimester: Secondary | ICD-10-CM

## 2019-06-15 DIAGNOSIS — Z3A28 28 weeks gestation of pregnancy: Secondary | ICD-10-CM

## 2019-06-15 DIAGNOSIS — Z3493 Encounter for supervision of normal pregnancy, unspecified, third trimester: Secondary | ICD-10-CM

## 2019-06-15 DIAGNOSIS — A749 Chlamydial infection, unspecified: Secondary | ICD-10-CM

## 2019-06-15 MED ORDER — AZITHROMYCIN 500 MG PO TABS: 1000.0000 mg | ORAL_TABLET | Freq: Once | ORAL | 0 refills | Status: AC

## 2019-06-15 NOTE — Progress Notes (Signed)
+   Chlamydia in MAU 06/13/19. Also positive 01/2019. Patient notified via active MyChart given early hour. Will attempt phone call at a more reasonable time of day. Treatment to pharmacy. Problem list and sticky note updated.  [name], MSN, CNM Certified Nurse Midwife, [facility] for [employer], [place] 06/15/19 7:56 AM

## 2019-06-15 NOTE — Progress Notes (Signed)
   PRENATAL VISIT NOTE  Subjective:  Julie Wilkinson is a 23 y.o. [id number] at [redacted]w[redacted]d being seen today for ongoing prenatal care.  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  Patient reports no complaints.  Contractions: Irritability. Vag. Bleeding: None.  Movement: Present. Denies leaking of fluid.   The following portions of the patient's history were reviewed and updated as appropriate: allergies, current medications, past family history, past medical history, past social history, past surgical history and problem list.   Objective:   Vitals:   06/15/19 0940  BP: 101/64  Pulse: 76  Weight: 157 lb 4.8 oz (71.4 kg)    Fetal Status:     Movement: Present     General:  Alert, oriented and cooperative. Patient is in no acute distress.  Skin: Skin is warm and dry. No rash noted.   Cardiovascular: Normal heart rate noted  Respiratory: Normal respiratory effort, no problems with respiration noted  Abdomen: Soft, gravid, appropriate for gestational age.  Pain/Pressure: Absent     Pelvic: Cervical exam deferred        Extremities: Normal range of motion.     Mental Status: Normal mood and affect. Normal behavior. Normal judgment and thought content.   Assessment and Plan:  Pregnancy: [id number] at 104w3d There are no diagnoses linked to this encounter. Preterm labor symptoms and general obstetric precautions including but not limited to vaginal bleeding, contractions, leaking of fluid and fetal movement were reviewed in detail with the patient. Please refer to After Visit Summary for other counseling recommendations.   No follow-ups on file.  Future Appointments   Date Time Provider [place]  07/19/2019 12:45 PM [place] 2 WH-MFCUS MFC-US  07/19/2019 12:50 PM WH-MFC NURSE WH-MFC MFC-US    Emily Filbert, MD

## 2019-06-16 ENCOUNTER — Encounter: Payer: Self-pay | Admitting: Obstetrics & Gynecology

## 2019-06-16 DIAGNOSIS — O99019 Anemia complicating pregnancy, unspecified trimester: Secondary | ICD-10-CM | POA: Insufficient documentation

## 2019-06-16 LAB — GLUCOSE TOLERANCE, 2 HOURS W/ 1HR
Glucose, 1 hour: 122 mg/dL (ref 65–179)
Glucose, 2 hour: 112 mg/dL (ref 65–152)
Glucose, Fasting: 83 mg/dL (ref 65–91)

## 2019-06-16 LAB — CBC
Hematocrit: 29.7 % — ABNORMAL LOW (ref 34.0–46.6)
Hemoglobin: 10.1 g/dL — ABNORMAL LOW (ref 11.1–15.9)
MCH: 32 pg (ref 26.6–33.0)
MCHC: 34 g/dL (ref 31.5–35.7)
MCV: 94 fL (ref 79–97)
Platelets: 220 10*3/uL (ref 150–450)
RBC: 3.16 x10E6/uL — ABNORMAL LOW (ref 3.77–5.28)
RDW: 11.8 % (ref 11.7–15.4)
WBC: 10.8 10*3/uL (ref 3.4–10.8)

## 2019-06-16 LAB — RPR: RPR Ser Ql: NONREACTIVE

## 2019-06-16 LAB — HIV ANTIBODY (ROUTINE TESTING W REFLEX): HIV Screen 4th Generation wRfx: NONREACTIVE

## 2019-06-19 ENCOUNTER — Encounter: Payer: Self-pay | Admitting: *Deleted

## 2019-06-28 ENCOUNTER — Other Ambulatory Visit: Payer: Self-pay

## 2019-06-28 DIAGNOSIS — Z3493 Encounter for supervision of normal pregnancy, unspecified, third trimester: Secondary | ICD-10-CM

## 2019-06-28 MED ORDER — ONDANSETRON HCL 8 MG PO TABS: 8.0000 mg | ORAL_TABLET | Freq: Three times a day (TID) | ORAL | 0 refills | Status: DC | PRN

## 2019-06-28 NOTE — Progress Notes (Signed)
Pt sent MyChart message requesting refill of Zofran. Verbal order obtained from Kerry Hough, [place]. Rx sent to pt's preferred pharmacy. Pt notified of refill via [place].

## 2019-07-05 ENCOUNTER — Other Ambulatory Visit: Payer: Self-pay

## 2019-07-05 ENCOUNTER — Other Ambulatory Visit (HOSPITAL_COMMUNITY)
Admission: RE | Admit: 2019-07-05 | Discharge: 2019-07-05 | Disposition: A | Discharge status: Home / Self Care | Payer: Medicaid Other | Source: Ambulatory Visit | Attending: Family Medicine | Admitting: Family Medicine

## 2019-07-05 ENCOUNTER — Ambulatory Visit (INDEPENDENT_AMBULATORY_CARE_PROVIDER_SITE_OTHER): Payer: Medicaid Other | Admitting: Family Medicine

## 2019-07-05 VITALS — BP 95/56 | HR 77 | Wt 159.0 lb

## 2019-07-05 DIAGNOSIS — A749 Chlamydial infection, unspecified: Secondary | ICD-10-CM | POA: Insufficient documentation

## 2019-07-05 DIAGNOSIS — O98813 Other maternal infectious and parasitic diseases complicating pregnancy, third trimester: Secondary | ICD-10-CM | POA: Insufficient documentation

## 2019-07-05 DIAGNOSIS — Z3493 Encounter for supervision of normal pregnancy, unspecified, third trimester: Secondary | ICD-10-CM

## 2019-07-05 DIAGNOSIS — Z3A31 31 weeks gestation of pregnancy: Secondary | ICD-10-CM

## 2019-07-05 DIAGNOSIS — O2343 Unspecified infection of urinary tract in pregnancy, third trimester: Secondary | ICD-10-CM

## 2019-07-05 DIAGNOSIS — B951 Streptococcus, group B, as the cause of diseases classified elsewhere: Secondary | ICD-10-CM

## 2019-07-05 NOTE — Progress Notes (Signed)
   PRENATAL VISIT NOTE  Subjective:  Stephonie Wilkinson is a 23 y.o. G4P1021 at [redacted]w[redacted]d being seen today for ongoing prenatal care.  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  Patient reports no complaints.  Contractions: Not present. Vag. Bleeding: None.  Movement: Present. Denies leaking of fluid.   The following portions of the patient's history were reviewed and updated as appropriate: allergies, current medications, past family history, past medical history, past social history, past surgical history and problem list.   Objective:   Vitals:   07/05/19 1103  BP: (!) 95/56  Pulse: 77  Weight: 159 lb (72.1 kg)    Fetal Status: Fetal Heart Rate (bpm): 128 Fundal Height: 30 cm Movement: Present     General:  Alert, oriented and cooperative. Patient is in no acute distress.  Skin: Skin is warm and dry. No rash noted.   Cardiovascular: Normal heart rate noted  Respiratory: Normal respiratory effort, no problems with respiration noted  Abdomen: Soft, gravid, appropriate for gestational age.  Pain/Pressure: Absent     Pelvic: Cervical exam deferred        Extremities: Normal range of motion.  Edema: None  Mental Status: Normal mood and affect. Normal behavior. Normal judgment and thought content.   Assessment and Plan:  Pregnancy: G4P1021 at [redacted]w[redacted]d 1. Chlamydia infection affecting pregnancy in third trimester Self swab done - GC/Chlamydia probe amp ([employer])not at [facility]  2. Encounter for supervision of low-risk pregnancy in third trimester Continue routine prenatal care. To f/u with [facility] about meds for Bipolar.   3. Group B  Streptococcus urinary tract infection affecting pregnancy in third trimester Will need treatment in labor  Preterm labor symptoms and general obstetric precautions including but not limited to vaginal bleeding, contractions, leaking of fluid and fetal movement were reviewed in detail with the patient. Please refer to After Visit Summary for other counseling recommendations.   Return in 2 weeks (on 07/19/2019) for virtual, [place].  Future Appointments  Date Time Provider [place]  07/19/2019 12:45 PM [place] 2 WH-MFCUS MFC-US  07/19/2019 12:50 PM [place] MFC-US  07/20/2019  1:35 PM Julie Leiter, MD [facility] WOC    Donnamae Jude, MD

## 2019-07-05 NOTE — Progress Notes (Signed)
Patient has elevated phq9 & gad7- states she is currently seeing someone at [facility]. Offered for her to see Roselyn Reef today & patient declined.

## 2019-07-05 NOTE — Patient Instructions (Signed)

## 2019-07-07 LAB — GC/CHLAMYDIA PROBE AMP (~~LOC~~) NOT AT ARMC
Chlamydia: NEGATIVE
Comment: NEGATIVE
Comment: NORMAL
Neisseria Gonorrhea: NEGATIVE

## 2019-07-19 ENCOUNTER — Other Ambulatory Visit: Payer: Self-pay

## 2019-07-19 ENCOUNTER — Ambulatory Visit (HOSPITAL_COMMUNITY): Payer: Medicaid Other | Admitting: *Deleted

## 2019-07-19 ENCOUNTER — Encounter (HOSPITAL_COMMUNITY): Payer: Self-pay

## 2019-07-19 ENCOUNTER — Ambulatory Visit (HOSPITAL_COMMUNITY)
Admission: RE | Admit: 2019-07-19 | Discharge: 2019-07-19 | Disposition: A | Discharge status: Home / Self Care | Payer: Medicaid Other | Source: Ambulatory Visit | Attending: Obstetrics and Gynecology | Admitting: Obstetrics and Gynecology

## 2019-07-19 VITALS — BP 110/59 | HR 75 | Temp 97.5°F

## 2019-07-19 DIAGNOSIS — O99343 Other mental disorders complicating pregnancy, third trimester: Secondary | ICD-10-CM

## 2019-07-19 DIAGNOSIS — F319 Bipolar disorder, unspecified: Secondary | ICD-10-CM

## 2019-07-19 DIAGNOSIS — Z3A32 32 weeks gestation of pregnancy: Secondary | ICD-10-CM | POA: Diagnosis not present

## 2019-07-19 DIAGNOSIS — Z362 Encounter for other antenatal screening follow-up: Secondary | ICD-10-CM | POA: Diagnosis not present

## 2019-07-19 DIAGNOSIS — O0990 Supervision of high risk pregnancy, unspecified, unspecified trimester: Secondary | ICD-10-CM | POA: Insufficient documentation

## 2019-07-19 DIAGNOSIS — Z148 Genetic carrier of other disease: Secondary | ICD-10-CM | POA: Diagnosis not present

## 2019-07-20 ENCOUNTER — Telehealth: Payer: Self-pay | Admitting: Obstetrics and Gynecology

## 2019-07-20 ENCOUNTER — Encounter: Payer: Self-pay | Admitting: Obstetrics and Gynecology

## 2019-07-20 ENCOUNTER — Telehealth: Payer: Medicaid Other | Admitting: Obstetrics and Gynecology

## 2019-07-20 DIAGNOSIS — Z3492 Encounter for supervision of normal pregnancy, unspecified, second trimester: Secondary | ICD-10-CM

## 2019-07-20 NOTE — Telephone Encounter (Signed)
Attempted to call patient to get her rescheduled for her missed ob appointment. No answer , left voicemail for patient to give the office a call back to be rescheduled. No show letter mailed °

## 2019-07-20 NOTE — Progress Notes (Signed)
Per RN, patient called x2 with no answer. Will have appt rescheduled.

## 2019-07-20 NOTE — Progress Notes (Signed)
1:23p- Called pt for My Chart visit, no answer, left VM that will call back in 10 to 15 minutes.    1:38p- 2nd attempt, still no answer, left VM that she will be rescheduled.

## 2019-07-25 ENCOUNTER — Telehealth: Payer: Self-pay | Admitting: Obstetrics and Gynecology

## 2019-07-25 NOTE — Telephone Encounter (Signed)
Per [employer], schedule the patient with an virtual or in person appointment. Scheduled the patient and left a voicemail message informing of the upcoming appointment on Friday.

## 2019-07-28 ENCOUNTER — Ambulatory Visit (INDEPENDENT_AMBULATORY_CARE_PROVIDER_SITE_OTHER): Payer: Medicaid Other | Admitting: Obstetrics & Gynecology

## 2019-07-28 ENCOUNTER — Other Ambulatory Visit: Payer: Self-pay

## 2019-07-28 DIAGNOSIS — O2342 Unspecified infection of urinary tract in pregnancy, second trimester: Secondary | ICD-10-CM

## 2019-07-28 DIAGNOSIS — B951 Streptococcus, group B, as the cause of diseases classified elsewhere: Secondary | ICD-10-CM

## 2019-07-28 DIAGNOSIS — Z3A34 34 weeks gestation of pregnancy: Secondary | ICD-10-CM

## 2019-07-28 DIAGNOSIS — Z3492 Encounter for supervision of normal pregnancy, unspecified, second trimester: Secondary | ICD-10-CM

## 2019-07-28 DIAGNOSIS — O2343 Unspecified infection of urinary tract in pregnancy, third trimester: Secondary | ICD-10-CM

## 2019-07-28 NOTE — Progress Notes (Signed)
TELEHEALTH OBSTETRICS PRENATAL VIRTUAL VIDEO VISIT ENCOUNTER NOTE  Provider location: [facility] [place]   I connected with Julie Wilkinson on 07/28/19 at  8:15 AM EST by MyChart Video Encounter at home and verified that I am speaking with the correct person using two identifiers.   I discussed the limitations, risks, security and privacy concerns of performing an evaluation and management service virtually and the availability of in person appointments. I also discussed with the patient that there may be a patient responsible charge related to this service. The patient expressed understanding and agreed to proceed. Subjective:  Julie Wilkinson is a 23 y.o. [id number] at [redacted]w[redacted]d being seen today for ongoing prenatal care.  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  Patient reports no complaints.   .  .   . Denies any leaking of fluid.   The following portions of the patient's history were reviewed and updated as appropriate: allergies, current medications, past family history, past medical history, past social history, past surgical history and problem list.   Objective:  There were no vitals filed for this visit.  Fetal Status:           General:  Alert, oriented and cooperative. Patient is in no acute distress.  Respiratory: Normal respiratory effort, no problems with respiration noted  Mental Status: Normal mood and affect. Normal behavior. Normal judgment and thought content.  Rest of physical exam deferred due to type of encounter  Imaging: [place] MFM OB FOLLOW UP  Result Date: 07/19/2019  ----------------------------------------------------------------------  OBSTETRICS REPORT                       (Signed Final 07/19/2019 02:01 pm) ---------------------------------------------------------------------- Patient Info  ID #:       [id number]                          D.O.B.:  Oct 06, 1995 (23 yrs)  Name:       Julie Wilkinson                     Visit Date: 07/19/2019 12:52 pm ---------------------------------------------------------------------- Performed By  Performed By:     Tommie Raymond BS,       Ref. Address:     [address], [place]                                                             [address]                                                             [place], [place]  [id number]  Attending:        Ma RingsVictor Fang MD         [facility]  Referred By:      Tereso NewcomerUGONNA A                    ANYANWU MD ---------------------------------------------------------------------- Orders   #  Description                          Code         Ordered By   1  US MFM OB FOLLOW UP                  [id number]     KELLY DAVIS  ----------------------------------------------------------------------   #  Order #                    Accession #                 Episode #   1  [id number]                  [id number]                  [id number]  ---------------------------------------------------------------------- Indications   Other mental disorder complicating             O99.340   pregnancy, third trimester (Bipolar)   Genetic carrier (specify) Carrier Medium       Z14.8   Chain Acyl-CoA Dehydrogenase Deficiency   [redacted] weeks gestation of pregnancy                Z3A.32   Encounter for antenatal screening for          Z36.3   malformations (Low Risk NIPS)   Encounter for antenatal screening,             Z36.9   unspecified   ---------------------------------------------------------------------- Vital Signs                                                 Height:        5'3" ---------------------------------------------------------------------- Fetal Evaluation  Num Of Fetuses:         1  Fetal Heart Rate(bpm):  145  Cardiac Activity:       Observed  Presentation:           Cephalic  Placenta:               Posterior  P. Cord Insertion:      Previously Visualized  Amniotic Fluid  AFI FV:      Within normal limits  AFI Sum(cm)     %Tile       Largest Pocket(cm)  17.09           62          5.46  RUQ(cm)  RLQ(cm)       LUQ(cm)        LLQ(cm)  4.23          5.46          2.94           4.46 ---------------------------------------------------------------------- Biometry  BPD:        84  mm     G. Age:  33w 6d         78  %    CI:        73.71   %    70 - 86                                                          FL/HC:      19.4   %    19.9 - 21.5  HC:      310.8  mm     G. Age:  34w 5d         71  %    HC/AC:      1.06        0.96 - 1.11  AC:      293.1  mm     G. Age:  33w 2d         71  %    FL/BPD:     71.9   %    71 - 87  FL:       60.4  mm     G. Age:  31w 3d         12  %    FL/AC:      20.6   %    20 - 24  HUM:      52.8  mm     G. Age:  30w 5d         19  %  CER:      43.9  mm     G. Age:  37w 6d       > 95  %  LV:        3.8  mm  Est. FW:    2085  gm    4 lb 10 oz      52  % ---------------------------------------------------------------------- OB History  Blood Type:    O+  Gravidity:    4         Term:   1        Prem:   0        SAB:   1  TOP:          1       Ectopic:  0        Living: 1 ---------------------------------------------------------------------- Gestational Age  U/S Today:     33w 2d                                        EDD:   09/04/19  Best:          32w 4d     Det. By:  Previous Ultrasound      EDD:   09/09/19                                      (  01/25/19)  ---------------------------------------------------------------------- Anatomy  Cranium:               Appears normal         Aortic Arch:            Appears normal  Cavum:                 Appears normal         Ductal Arch:            Appears normal  Ventricles:            Appears normal         Diaphragm:              Appears normal  Choroid Plexus:        Appears normal         Stomach:                Appears normal, left                                                                        sided  Cerebellum:            Appears normal         Abdomen:                Previously seen  Posterior Fossa:       Previously seen        Abdominal Wall:         Previously seen  Nuchal Fold:           Previously seen        Cord Vessels:           Appears normal (3                                                                        vessel cord)  Face:                  Orbits and profile     Kidneys:                Appear normal                         previously seen  Lips:                  Previously seen        Bladder:                Appears normal  Thoracic:              Previously seen        Spine:                  Limited views  appear normal  Heart:                 Appears normal         Upper Extremities:      Previously seen                         (4CH, axis, and                         situs)  RVOT:                  Appears normal         Lower Extremities:      Previously seen  LVOT:                  Appears normal  Other:  Nasal bone previously seen. Female gender previously seen.          Technically difficult due to fetal position. ---------------------------------------------------------------------- Cervix Uterus Adnexa  Cervix  Not visualized (advanced GA >24wks)  Uterus  No abnormality visualized.  Left Ovary  Within normal limits. No adnexal mass visualized.  Right Ovary  Within normal limits. No adnexal mass visualized.  Cul De  Sac  No free fluid seen.  Adnexa  No abnormality visualized. ---------------------------------------------------------------------- Comments  This patient was seen for a follow up growth scan as she is a  carrier for an enzyme deficiency.  She denies any problems  in her current pregnancy and denies any problems since her  last exam.  She was informed that the fetal growth and amniotic fluid  level appears appropriate for her gestational age.  Follow-up as indicated. ----------------------------------------------------------------------                   Ma Rings, MD Electronically Signed Final Report   07/19/2019 02:01 pm ----------------------------------------------------------------------   Assessment and Plan:  Pregnancy: [id number] at [redacted]w[redacted]d There are no diagnoses linked to this encounter. Preterm labor symptoms and general obstetric precautions including but not limited to vaginal bleeding, contractions, leaking of fluid and fetal movement were reviewed in detail with the patient. I discussed the assessment and treatment plan with the patient. The patient was provided an opportunity to ask questions and all were answered. The patient agreed with the plan and demonstrated an understanding of the instructions. The patient was advised to call back or seek an in-person office evaluation/go to [facility] at [facility] for any urgent or concerning symptoms. Please refer to After Visit Summary for other counseling recommendations.   I provided 10 minutes of face-to-face time during this encounter.  No follow-ups on file.  No future appointments.  Allie Bossier, MD [facility], [facility]

## 2019-08-06 ENCOUNTER — Other Ambulatory Visit: Payer: Self-pay

## 2019-08-06 DIAGNOSIS — Z3493 Encounter for supervision of normal pregnancy, unspecified, third trimester: Secondary | ICD-10-CM

## 2019-08-07 MED ORDER — ONDANSETRON HCL 8 MG PO TABS: 8.0000 mg | ORAL_TABLET | Freq: Three times a day (TID) | ORAL | 0 refills | Status: DC | PRN

## 2019-08-11 DIAGNOSIS — F99 Mental disorder, not otherwise specified: Secondary | ICD-10-CM

## 2019-08-11 HISTORY — DX: Mental disorder, not otherwise specified: F99

## 2019-08-11 NOTE — L&D Delivery Note (Addendum)
OB/GYN Faculty Practice Delivery Note  Julie Wilkinson is a 24 y.o. [id number] s/p SVD at [redacted]w[redacted]d. She was admitted for SOL.   ROM: 0h 31m with clear fluid GBS Status: GBS positive by urine culture   Maximum Maternal Temperature: 98.46F  Labor Progress: Patient presented to L&D for SOL. Initial SVE: 6/80/ballotable. Labor course was uncomplicated. She then progressed to complete.   Delivery Date/Time: 09/04/2019 @ 0027 Delivery: Called to room and patient was complete and +3 however without urge to push, only complaining of mild pressure. Patient attempted to push with and apart from contractions and head of baby began advancing with ease. Head position was LOA and delivered with ease over the perineum. Nuchal cord present x1, tight. Shoulder and body delivered via somersault. Infant with spontaneous cry, placed on mother's abdomen, dried and stimulated. Cord clamped x 2 after 1-minute delay, and cut by FOB. Cord blood drawn. Placenta delivered spontaneously with gentle cord traction. Fundus firm with massage and pitocin started. Labia, perineum, vagina, and cervix inspected and significant for a large R labial laceration and a small L labial laceration, the R side was repaired.  Baby Weight: pending  Cord: central insertion, 3 vessel Placenta: Sent to L&D Complications: None Lacerations: bilateral labial, R side repaired with 4-0 monocryl EBL: 478cc Analgesia: Epidural, and local intralesional lidocaine for laceration repair   Infant: APGAR (1 MIN): 8 APGAR (5 MINS): 9   Lorri Frederick, DO, PGY-1 OBGYN Faculty Teaching Service  09/04/2019, 12:51 AM   OB FELLOW ATTESTATION  I was present, gloved, and supervising throughout delivery and have edited the above note to reflect any changes or updates.  Zack Seal, MD/[facility] OB Fellow  09/04/2019, 1:38 AM

## 2019-08-15 ENCOUNTER — Ambulatory Visit (INDEPENDENT_AMBULATORY_CARE_PROVIDER_SITE_OTHER): Payer: Medicaid Other | Admitting: Family Medicine

## 2019-08-15 ENCOUNTER — Other Ambulatory Visit (HOSPITAL_COMMUNITY)
Admission: RE | Admit: 2019-08-15 | Discharge: 2019-08-15 | Disposition: A | Discharge status: Home / Self Care | Payer: Medicaid Other | Source: Ambulatory Visit | Attending: Obstetrics and Gynecology | Admitting: Obstetrics and Gynecology

## 2019-08-15 ENCOUNTER — Other Ambulatory Visit: Payer: Self-pay

## 2019-08-15 ENCOUNTER — Encounter: Payer: Self-pay | Admitting: Family Medicine

## 2019-08-15 VITALS — BP 108/72 | HR 98 | Wt 162.4 lb

## 2019-08-15 DIAGNOSIS — O99013 Anemia complicating pregnancy, third trimester: Secondary | ICD-10-CM

## 2019-08-15 DIAGNOSIS — F4310 Post-traumatic stress disorder, unspecified: Secondary | ICD-10-CM

## 2019-08-15 DIAGNOSIS — B951 Streptococcus, group B, as the cause of diseases classified elsewhere: Secondary | ICD-10-CM

## 2019-08-15 DIAGNOSIS — O2343 Unspecified infection of urinary tract in pregnancy, third trimester: Secondary | ICD-10-CM

## 2019-08-15 DIAGNOSIS — O2340 Unspecified infection of urinary tract in pregnancy, unspecified trimester: Secondary | ICD-10-CM

## 2019-08-15 DIAGNOSIS — Z229 Carrier of infectious disease, unspecified: Secondary | ICD-10-CM

## 2019-08-15 DIAGNOSIS — Z3492 Encounter for supervision of normal pregnancy, unspecified, second trimester: Secondary | ICD-10-CM | POA: Diagnosis not present

## 2019-08-15 DIAGNOSIS — A749 Chlamydial infection, unspecified: Secondary | ICD-10-CM

## 2019-08-15 DIAGNOSIS — O98813 Other maternal infectious and parasitic diseases complicating pregnancy, third trimester: Secondary | ICD-10-CM

## 2019-08-15 DIAGNOSIS — Z3A37 37 weeks gestation of pregnancy: Secondary | ICD-10-CM

## 2019-08-15 NOTE — Patient Instructions (Signed)
 Contraception Choices Contraception, also called birth control, refers to methods or devices that prevent pregnancy. Hormonal methods Contraceptive implant  A contraceptive implant is a thin, plastic tube that contains a hormone. It is inserted into the upper part of the arm. It can remain in place for up to 3 years. Progestin-only injections Progestin-only injections are injections of progestin, a synthetic form of the hormone progesterone. They are given every 3 months by a health care provider. Birth control pills  Birth control pills are pills that contain hormones that prevent pregnancy. They must be taken once a day, preferably at the same time each day. Birth control patch  The birth control patch contains hormones that prevent pregnancy. It is placed on the skin and must be changed once a week for three weeks and removed on the fourth week. A prescription is needed to use this method of contraception. Vaginal ring  A vaginal ring contains hormones that prevent pregnancy. It is placed in the vagina for three weeks and removed on the fourth week. After that, the process is repeated with a new ring. A prescription is needed to use this method of contraception. Emergency contraceptive Emergency contraceptives prevent pregnancy after unprotected sex. They come in pill form and can be taken up to 5 days after sex. They work best the sooner they are taken after having sex. Most emergency contraceptives are available without a prescription. This method should not be used as your only form of birth control. Barrier methods Female condom  A female condom is a thin sheath that is worn over the penis during sex. Condoms keep sperm from going inside a woman's body. They can be used with a spermicide to increase their effectiveness. They should be disposed after a single use. Female condom  A female condom is a soft, loose-fitting sheath that is put into the vagina before sex. The condom keeps  sperm from going inside a woman's body. They should be disposed after a single use. Diaphragm  A diaphragm is a soft, dome-shaped barrier. It is inserted into the vagina before sex, along with a spermicide. The diaphragm blocks sperm from entering the uterus, and the spermicide kills sperm. A diaphragm should be left in the vagina for 6-8 hours after sex and removed within 24 hours. A diaphragm is prescribed and fitted by a health care provider. A diaphragm should be replaced every 1-2 years, after giving birth, after gaining more than 15 lb (6.8 kg), and after pelvic surgery. Cervical cap  A cervical cap is a round, soft latex or plastic cup that fits over the cervix. It is inserted into the vagina before sex, along with spermicide. It blocks sperm from entering the uterus. The cap should be left in place for 6-8 hours after sex and removed within 48 hours. A cervical cap must be prescribed and fitted by a health care provider. It should be replaced every 2 years. Sponge  A sponge is a soft, circular piece of polyurethane foam with spermicide on it. The sponge helps block sperm from entering the uterus, and the spermicide kills sperm. To use it, you make it wet and then insert it into the vagina. It should be inserted before sex, left in for at least 6 hours after sex, and removed and thrown away within 30 hours. Spermicides Spermicides are chemicals that kill or block sperm from entering the cervix and uterus. They can come as a cream, jelly, suppository, foam, or tablet. A spermicide should be inserted into   the vagina with an applicator at least 10-15 minutes before sex to allow time for it to work. The process must be repeated every time you have sex. Spermicides do not require a prescription. Intrauterine contraception Intrauterine device (IUD) An IUD is a T-shaped device that is put in a woman's uterus. There are two types:  Hormone IUD.This type contains progestin, a synthetic form of the  hormone progesterone. This type can stay in place for 3-5 years.  Copper IUD.This type is wrapped in copper wire. It can stay in place for 10 years.  Permanent methods of contraception Female tubal ligation In this method, a woman's fallopian tubes are sealed, tied, or blocked during surgery to prevent eggs from traveling to the uterus. Hysteroscopic sterilization In this method, a small, flexible insert is placed into each fallopian tube. The inserts cause scar tissue to form in the fallopian tubes and block them, so sperm cannot reach an egg. The procedure takes about 3 months to be effective. Another form of birth control must be used during those 3 months. Female sterilization This is a procedure to tie off the tubes that carry sperm (vasectomy). After the procedure, the man can still ejaculate fluid (semen). Natural planning methods Natural family planning In this method, a couple does not have sex on days when the woman could become pregnant. Calendar method This means keeping track of the length of each menstrual cycle, identifying the days when pregnancy can happen, and not having sex on those days. Ovulation method In this method, a couple avoids sex during ovulation. Symptothermal method This method involves not having sex during ovulation. The woman typically checks for ovulation by watching changes in her temperature and in the consistency of cervical mucus. Post-ovulation method In this method, a couple waits to have sex until after ovulation. Summary  Contraception, also called birth control, means methods or devices that prevent pregnancy.  Hormonal methods of contraception include implants, injections, pills, patches, vaginal rings, and emergency contraceptives.  Barrier methods of contraception can include female condoms, female condoms, diaphragms, cervical caps, sponges, and spermicides.  There are two types of IUDs (intrauterine devices). An IUD can be put in a woman's  uterus to prevent pregnancy for 3-5 years.  Permanent sterilization can be done through a procedure for males, females, or both.  Natural family planning methods involve not having sex on days when the woman could become pregnant. This information is not intended to replace advice given to you by your health care provider. Make sure you discuss any questions you have with your health care provider. Document Revised: 07/29/2017 Document Reviewed: 08/29/2016 Elsevier Patient Education  2020 [employer]   Breastfeeding  Choosing to breastfeed is one of the best decisions you can make for yourself and your baby. A change in hormones during pregnancy causes your breasts to make breast milk in your milk-producing glands. Hormones prevent breast milk from being released before your baby is born. They also prompt milk flow after birth. Once breastfeeding has begun, thoughts of your baby, as well as his or her sucking or crying, can stimulate the release of milk from your milk-producing glands. Benefits of breastfeeding Research shows that breastfeeding offers many health benefits for infants and mothers. It also offers a cost-free and convenient way to feed your baby. For your baby  Your first milk (colostrum) helps your baby's digestive system to function better.  Special cells in your milk (antibodies) help your baby to fight off infections.  Breastfed babies are   less likely to develop asthma, allergies, obesity, or type 2 diabetes. They are also at lower risk for sudden infant death syndrome (SIDS).  Nutrients in breast milk are better able to meet your baby's needs compared to infant formula.  Breast milk improves your baby's brain development. For you  Breastfeeding helps to create a very special bond between you and your baby.  Breastfeeding is convenient. Breast milk costs nothing and is always available at the correct temperature.  Breastfeeding helps to burn calories. It helps you  to lose the weight that you gained during pregnancy.  Breastfeeding makes your uterus return faster to its size before pregnancy. It also slows bleeding (lochia) after you give birth.  Breastfeeding helps to lower your risk of developing type 2 diabetes, osteoporosis, rheumatoid arthritis, cardiovascular disease, and breast, ovarian, uterine, and endometrial cancer later in life. Breastfeeding basics Starting breastfeeding  Find a comfortable place to sit or lie down, with your neck and back well-supported.  Place a pillow or a rolled-up blanket under your baby to bring him or her to the level of your breast (if you are seated). Nursing pillows are specially designed to help support your arms and your baby while you breastfeed.  Make sure that your baby's tummy (abdomen) is facing your abdomen.  Gently massage your breast. With your fingertips, massage from the outer edges of your breast inward toward the nipple. This encourages milk flow. If your milk flows slowly, you may need to continue this action during the feeding.  Support your breast with 4 fingers underneath and your thumb above your nipple (make the letter "C" with your hand). Make sure your fingers are well away from your nipple and your baby's mouth.  Stroke your baby's lips gently with your finger or nipple.  When your baby's mouth is open wide enough, quickly bring your baby to your breast, placing your entire nipple and as much of the areola as possible into your baby's mouth. The areola is the colored area around your nipple. ? More areola should be visible above your baby's upper lip than below the lower lip. ? Your baby's lips should be opened and extended outward (flanged) to ensure an adequate, comfortable latch. ? Your baby's tongue should be between his or her lower gum and your breast.  Make sure that your baby's mouth is correctly positioned around your nipple (latched). Your baby's lips should create a seal on your  breast and be turned out (everted).  It is common for your baby to suck about 2-3 minutes in order to start the flow of breast milk. Latching Teaching your baby how to latch onto your breast properly is very important. An improper latch can cause nipple pain, decreased milk supply, and poor weight gain in your baby. Also, if your baby is not latched onto your nipple properly, he or she may swallow some air during feeding. This can make your baby fussy. Burping your baby when you switch breasts during the feeding can help to get rid of the air. However, teaching your baby to latch on properly is still the best way to prevent fussiness from swallowing air while breastfeeding. Signs that your baby has successfully latched onto your nipple  Silent tugging or silent sucking, without causing you pain. Infant's lips should be extended outward (flanged).  Swallowing heard between every 3-4 sucks once your milk has started to flow (after your let-down milk reflex occurs).  Muscle movement above and in front of his or her   ears while sucking. Signs that your baby has not successfully latched onto your nipple  Sucking sounds or smacking sounds from your baby while breastfeeding.  Nipple pain. If you think your baby has not latched on correctly, slip your finger into the corner of your baby's mouth to break the suction and place it between your baby's gums. Attempt to start breastfeeding again. Signs of successful breastfeeding Signs from your baby  Your baby will gradually decrease the number of sucks or will completely stop sucking.  Your baby will fall asleep.  Your baby's body will relax.  Your baby will retain a small amount of milk in his or her mouth.  Your baby will let go of your breast by himself or herself. Signs from you  Breasts that have increased in firmness, weight, and size 1-3 hours after feeding.  Breasts that are softer immediately after breastfeeding.  Increased milk  volume, as well as a change in milk consistency and color by the fifth day of breastfeeding.  Nipples that are not sore, cracked, or bleeding. Signs that your baby is getting enough milk  Wetting at least 1-2 diapers during the first 24 hours after birth.  Wetting at least 5-6 diapers every 24 hours for the first week after birth. The urine should be clear or pale yellow by the age of 5 days.  Wetting 6-8 diapers every 24 hours as your baby continues to grow and develop.  At least 3 stools in a 24-hour period by the age of 5 days. The stool should be soft and yellow.  At least 3 stools in a 24-hour period by the age of 7 days. The stool should be seedy and yellow.  No loss of weight greater than 10% of birth weight during the first 3 days of life.  Average weight gain of 4-7 oz (113-198 g) per week after the age of 4 days.  Consistent daily weight gain by the age of 5 days, without weight loss after the age of 2 weeks. After a feeding, your baby may spit up a small amount of milk. This is normal. Breastfeeding frequency and duration Frequent feeding will help you make more milk and can prevent sore nipples and extremely full breasts (breast engorgement). Breastfeed when you feel the need to reduce the fullness of your breasts or when your baby shows signs of hunger. This is called "breastfeeding on demand." Signs that your baby is hungry include:  Increased alertness, activity, or restlessness.  Movement of the head from side to side.  Opening of the mouth when the corner of the mouth or cheek is stroked (rooting).  Increased sucking sounds, smacking lips, cooing, sighing, or squeaking.  Hand-to-mouth movements and sucking on fingers or hands.  Fussing or crying. Avoid introducing a pacifier to your baby in the first 4-6 weeks after your baby is born. After this time, you may choose to use a pacifier. Research has shown that pacifier use during the first year of a baby's life  decreases the risk of sudden infant death syndrome (SIDS). Allow your baby to feed on each breast as long as he or she wants. When your baby unlatches or falls asleep while feeding from the first breast, offer the second breast. Because newborns are often sleepy in the first few weeks of life, you may need to awaken your baby to get him or her to feed. Breastfeeding times will vary from baby to baby. However, the following rules can serve as a guide to   help you make sure that your baby is properly fed:  Newborns (babies 4 weeks of age or younger) may breastfeed every 1-3 hours.  Newborns should not go without breastfeeding for longer than 3 hours during the day or 5 hours during the night.  You should breastfeed your baby a minimum of 8 times in a 24-hour period. Breast milk pumping     Pumping and storing breast milk allows you to make sure that your baby is exclusively fed your breast milk, even at times when you are unable to breastfeed. This is especially important if you go back to work while you are still breastfeeding, or if you are not able to be present during feedings. Your lactation consultant can help you find a method of pumping that works best for you and give you guidelines about how long it is safe to store breast milk. Caring for your breasts while you breastfeed Nipples can become dry, cracked, and sore while breastfeeding. The following recommendations can help keep your breasts moisturized and healthy:  Avoid using soap on your nipples.  Wear a supportive bra designed especially for nursing. Avoid wearing underwire-style bras or extremely tight bras (sports bras).  Air-dry your nipples for 3-4 minutes after each feeding.  Use only cotton bra pads to absorb leaked breast milk. Leaking of breast milk between feedings is normal.  Use lanolin on your nipples after breastfeeding. Lanolin helps to maintain your skin's normal moisture barrier. Pure lanolin is not harmful (not  toxic) to your baby. You may also hand express a few drops of breast milk and gently massage that milk into your nipples and allow the milk to air-dry. In the first few weeks after giving birth, some women experience breast engorgement. Engorgement can make your breasts feel heavy, warm, and tender to the touch. Engorgement peaks within 3-5 days after you give birth. The following recommendations can help to ease engorgement:  Completely empty your breasts while breastfeeding or pumping. You may want to start by applying warm, moist heat (in the shower or with warm, water-soaked hand towels) just before feeding or pumping. This increases circulation and helps the milk flow. If your baby does not completely empty your breasts while breastfeeding, pump any extra milk after he or she is finished.  Apply ice packs to your breasts immediately after breastfeeding or pumping, unless this is too uncomfortable for you. To do this: ? Put ice in a plastic bag. ? Place a towel between your skin and the bag. ? Leave the ice on for 20 minutes, 2-3 times a day.  Make sure that your baby is latched on and positioned properly while breastfeeding. If engorgement persists after 48 hours of following these recommendations, contact your health care provider or a lactation consultant. Overall health care recommendations while breastfeeding  Eat 3 healthy meals and 3 snacks every day. Well-nourished mothers who are breastfeeding need an additional 450-500 calories a day. You can meet this requirement by increasing the amount of a balanced diet that you eat.  Drink enough water to keep your urine pale yellow or clear.  Rest often, relax, and continue to take your prenatal vitamins to prevent fatigue, stress, and low vitamin and mineral levels in your body (nutrient deficiencies).  Do not use any products that contain nicotine or tobacco, such as cigarettes and e-cigarettes. Your baby may be harmed by chemicals from  cigarettes that pass into breast milk and exposure to secondhand smoke. If you need help quitting, ask your   health care provider.  Avoid alcohol.  Do not use illegal drugs or marijuana.  Talk with your health care provider before taking any medicines. These include over-the-counter and prescription medicines as well as vitamins and herbal supplements. Some medicines that may be harmful to your baby can pass through breast milk.  It is possible to become pregnant while breastfeeding. If birth control is desired, ask your health care provider about options that will be safe while breastfeeding your baby. Where to find more information: [employer]: www.llli.org Contact a health care provider if:  You feel like you want to stop breastfeeding or have become frustrated with breastfeeding.  Your nipples are cracked or bleeding.  Your breasts are red, tender, or warm.  You have: ? Painful breasts or nipples. ? A swollen area on either breast. ? A fever or chills. ? Nausea or vomiting. ? Drainage other than breast milk from your nipples.  Your breasts do not become full before feedings by the fifth day after you give birth.  You feel sad and depressed.  Your baby is: ? Too sleepy to eat well. ? Having trouble sleeping. ? More than 1 week old and wetting fewer than 6 diapers in a 24-hour period. ? Not gaining weight by 5 days of age.  Your baby has fewer than 3 stools in a 24-hour period.  Your baby's skin or the white parts of his or her eyes become yellow. Get help right away if:  Your baby is overly tired (lethargic) and does not want to wake up and feed.  Your baby develops an unexplained fever. Summary  Breastfeeding offers many health benefits for infant and mothers.  Try to breastfeed your infant when he or she shows early signs of hunger.  Gently tickle or stroke your baby's lips with your finger or nipple to allow the baby to open his or her mouth.  Bring the baby to your breast. Make sure that much of the areola is in your baby's mouth. Offer one side and burp the baby before you offer the other side.  Talk with your health care provider or lactation consultant if you have questions or you face problems as you breastfeed. This information is not intended to replace advice given to you by your health care provider. Make sure you discuss any questions you have with your health care provider. Document Revised: 10/21/2017 Document Reviewed: 08/28/2016 Elsevier Patient Education  2020 [employer] Inc.  

## 2019-08-15 NOTE — Progress Notes (Signed)
   Subjective:  Julie Wilkinson is a 24 y.o. [phone] at [redacted]w[redacted]d being seen today for ongoing prenatal care.  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  Patient reports no complaints.  Contractions: Irregular. Vag. Bleeding: Bloody Show.  Movement: Present. Denies leaking of fluid.   The following portions of the patient's history were reviewed and updated as appropriate: allergies, current medications, past family history, past medical history, past social history, past surgical history and problem list. Problem list updated.  Objective:   Vitals:   08/15/19 1626  BP: 108/72  Pulse: 98  Weight: 162 lb 6.4 oz (73.7 kg)    Fetal Status: Fetal Heart Rate (bpm): 142 Fundal Height: 36 cm Movement: Present     General:  Alert, oriented and cooperative. Patient is in no acute distress.  Skin: Skin is warm and dry. No rash noted.   Cardiovascular: Normal heart rate noted  Respiratory: Normal respiratory effort, no problems with respiration noted  Abdomen: Soft, gravid, appropriate for gestational age. Pain/Pressure: Absent     Pelvic: Vag. Bleeding: Bloody Show     Cervical exam deferred        Extremities: Normal range of motion.  Edema: None  Mental Status: Normal mood and affect. Normal behavior. Normal judgment and thought content.   Urinalysis:      Assessment and Plan:  Pregnancy: [id number] at [redacted]w[redacted]d  1. Encounter for supervision of low-risk pregnancy in second trimester Stable Would like membranes stripped at next visit  2. Group B Streptococcus urinary tract infection affecting pregnancy, antepartum Neg TOC 06/13/2019, PL updated  IP prophylaxis  3. Anemia during pregnancy in third trimester   4. 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 Chlamydia infection affecting pregnancy in third trimester Neg TOC on 07/05/2019, PL updated  6. PTSD (post-traumatic stress disorder)   Term labor symptoms and general obstetric precautions including but not limited to vaginal bleeding, contractions, leaking of fluid and fetal movement were reviewed in detail with the patient. Please refer to After Visit Summary for other counseling recommendations.  Return in 1 week (on 08/22/2019) for [facility], in person.   Venora Maples, MD

## 2019-08-16 ENCOUNTER — Encounter: Payer: Medicaid Other | Admitting: Obstetrics and Gynecology

## 2019-08-17 LAB — GC/CHLAMYDIA PROBE AMP (~~LOC~~) NOT AT ARMC
Chlamydia: NEGATIVE
Comment: NEGATIVE
Comment: NORMAL
Neisseria Gonorrhea: NEGATIVE

## 2019-08-22 ENCOUNTER — Other Ambulatory Visit: Payer: Self-pay

## 2019-08-22 ENCOUNTER — Encounter: Payer: Self-pay | Admitting: Nurse Practitioner

## 2019-08-22 ENCOUNTER — Ambulatory Visit (INDEPENDENT_AMBULATORY_CARE_PROVIDER_SITE_OTHER): Payer: Medicaid Other | Admitting: Nurse Practitioner

## 2019-08-22 VITALS — BP 100/70 | HR 90 | Wt 159.0 lb

## 2019-08-22 DIAGNOSIS — Z3A38 38 weeks gestation of pregnancy: Secondary | ICD-10-CM

## 2019-08-22 DIAGNOSIS — Z3492 Encounter for supervision of normal pregnancy, unspecified, second trimester: Secondary | ICD-10-CM

## 2019-08-22 DIAGNOSIS — O2343 Unspecified infection of urinary tract in pregnancy, third trimester: Secondary | ICD-10-CM

## 2019-08-22 DIAGNOSIS — B951 Streptococcus, group B, as the cause of diseases classified elsewhere: Secondary | ICD-10-CM

## 2019-08-22 NOTE — Progress Notes (Signed)
    Subjective:  Julie Wilkinson is a 24 y.o. [id number] at [redacted]w[redacted]d being seen today for ongoing prenatal care.  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  Patient reports lots of contractions.  Contractions: Irritability. Vag. Bleeding: None.  Movement: Present. Denies leaking of fluid.   The following portions of the patient's history were reviewed and updated as appropriate: allergies, current medications, past family history, past medical history, past social history, past surgical history and problem list. Problem list updated.  Objective:   Vitals:   08/22/19 1013  BP: 100/70  Pulse: 90  Weight: 159 lb (72.1 kg)    Fetal Status: Fetal Heart Rate (bpm): 154 Fundal Height: 37 cm Movement: Present  Presentation: Vertex  General:  Alert, oriented and cooperative. Patient is in no acute distress.  Skin: Skin is warm and dry. No rash noted.   Cardiovascular: Normal heart rate noted  Respiratory: Normal respiratory effort, no problems with respiration noted  Abdomen: Soft, gravid, appropriate for gestational age. Pain/Pressure: Absent     Pelvic:  Cervical exam performed Dilation: Closed   Station: Ballotable  Extremities: Normal range of motion.  Edema: None  Mental Status: Normal mood and affect. Normal behavior. Normal judgment and thought content.   Urinalysis:      Assessment and Plan:  Pregnancy: G4P1021 at [redacted]w[redacted]d  1. Encounter for supervision of low-risk pregnancy in second trimester Cervix not open fully.  Baby is high  Reviewed exercises to help baby move down. Client somewhat concerned.  Has lost 5 pounds.  Reassured.  2.  Group B Streptococcus urinary tract infection affecting pregnancy, antepartum Treat in labor  3.  No problems with bipolar or PTSD right now.  No mood problems. Advised re: postpartum depression and resources in the office if she needs them.  Term labor symptoms and general obstetric precautions including but not limited to vaginal bleeding, contractions, leaking of fluid and fetal movement were reviewed in detail with the patient. Please refer to After Visit Summary for other counseling recommendations.  Return in about 1 week (around 08/29/2019) for in person ROB.  Nolene Bernheim, RN, MSN, NP-BC Nurse Practitioner, [facility] for [employer], [facility] 08/22/2019 8:19 PM

## 2019-08-23 ENCOUNTER — Encounter: Payer: Medicaid Other | Admitting: Certified Nurse Midwife

## 2019-08-31 ENCOUNTER — Other Ambulatory Visit: Payer: Self-pay

## 2019-08-31 ENCOUNTER — Ambulatory Visit (INDEPENDENT_AMBULATORY_CARE_PROVIDER_SITE_OTHER): Payer: Medicaid Other | Admitting: Obstetrics and Gynecology

## 2019-08-31 VITALS — BP 104/67 | HR 82 | Wt 162.6 lb

## 2019-08-31 DIAGNOSIS — Z3A39 39 weeks gestation of pregnancy: Secondary | ICD-10-CM

## 2019-08-31 DIAGNOSIS — Z3493 Encounter for supervision of normal pregnancy, unspecified, third trimester: Secondary | ICD-10-CM

## 2019-08-31 NOTE — Progress Notes (Signed)
   PRENATAL VISIT NOTE  Subjective:  Julie Wilkinson is a 24 y.o. [phone] at [redacted]w[redacted]d being seen today for ongoing prenatal care.  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  Patient reports no complaints.  Contractions: Irregular. Vag. Bleeding: None.  Movement: Present. Denies leaking of fluid.   The following portions of the patient's history were reviewed and updated as appropriate: allergies, current medications, past family history, past medical history, past social history, past surgical history and problem list.   Objective:   Vitals:   08/31/19 0951  BP: 104/67  Pulse: 82  Weight: 162 lb 9.6 oz (73.8 kg)    Fetal Status: Fetal Heart Rate (bpm): 130 Fundal Height: 38 cm Movement: Present  Presentation: Vertex  General:  Alert, oriented and cooperative. Patient is in no acute distress.  Skin: Skin is warm and dry. No rash noted.   Cardiovascular: Normal heart rate noted  Respiratory: Normal respiratory effort, no problems with respiration noted  Abdomen: Soft, gravid, appropriate for gestational age.  Pain/Pressure: Present     Pelvic: Cervical exam performed Dilation: 2 Effacement (%): 50 Station: Ballotable  Extremities: Normal range of motion.  Edema: None  Mental Status: Normal mood and affect. Normal behavior. Normal judgment and thought content.   Assessment and Plan:  Pregnancy: G4P1021 at [redacted]w[redacted]d  1. Encounter for supervision of low-risk pregnancy in third trimester  Patient requests induction at 40 weeks 1/26: for induction, orders placed. Patient to come to the office on 1/25 PM for foley insertion and NST Membranes  swiped today. BP good.    Term labor symptoms and general obstetric precautions including but not limited to vaginal bleeding, contractions, leaking of fluid and fetal movement were reviewed in detail with the patient. Please refer to After Visit Summary for other counseling recommendations.   Return in about 5 days (around 09/05/2019) for For in person foley insertion and NST. Induction on 09/05/2019.  Future Appointments  Date Time Provider Department Center  09/04/2019  1:15 PM [facility] NST [facility]  09/04/2019  2:55 PM Levie Heritage, DO [facility]    Venia Carbon, NP

## 2019-08-31 NOTE — Progress Notes (Signed)
Positive PHQ-9; score is 11. Pt reports recently beginning care with a counselor. [facility] services offered, pt declines at this time.   Fleet Contras RN 08/31/19

## 2019-08-31 NOTE — Patient Instructions (Addendum)
OUTPATIENT FOLEY BULB INDUCTION OF LABOR:  Information Sheet for Mothers and Family               What's a Foley Bulb Induction? A Foley bulb induction is a procedure where your provider inserts a catheter into your cervix. Once inside your womb, your provider inflates the balloon with a saline solution.   This puts pressure on your cervix and encourages dilation. The catheter falls out once your cervix dilates to 3-4 centimeters.     With any procedure, it's important that you know what to expect. The insertion of a Foley catheter can be a bit uncomfortable, and some women experience sharp pelvic pain. The pain may subside once the catheter is in place. You may experience some cramping when the Foley catheter is in place.  This is normal.     GO TO THE MATERNITY ADMISSIONS UNIT FOR THE FOLLOWING:  Heavy vaginal bleeding  Rupture of membranes (fluid that wets your underwear)  Painful uterine contractions every 5 minutes or less  Severe abdominal discomfort  Decreased movement of the baby      Cervical Ripening: May try one or both  Red Raspberry Leaf capsules:  two 300mg  or 400mg  tablets with each meal, 2-3 times a day  Potential Side Effects Of Raspberry Leaf:  Most women do not experience any side effects from drinking raspberry leaf tea. However, nausea and loose stools are possible     Evening Primrose Oil capsules: may take 1 to 3 capsules daily. May also prick one to release the oil and insert it into your vagina at night.  Some of the potential side effects:  Upset stomach  Loose stools or diarrhea  Headaches  Nausea:

## 2019-09-01 ENCOUNTER — Telehealth (HOSPITAL_COMMUNITY): Payer: Self-pay | Admitting: *Deleted

## 2019-09-01 NOTE — Telephone Encounter (Signed)
Preadmission screen  

## 2019-09-02 ENCOUNTER — Other Ambulatory Visit (HOSPITAL_COMMUNITY): Payer: Medicaid Other

## 2019-09-03 ENCOUNTER — Inpatient Hospital Stay (HOSPITAL_COMMUNITY): Payer: Medicaid Other | Admitting: Anesthesiology

## 2019-09-03 ENCOUNTER — Other Ambulatory Visit: Payer: Self-pay | Admitting: Family Medicine

## 2019-09-03 ENCOUNTER — Other Ambulatory Visit: Payer: Self-pay

## 2019-09-03 ENCOUNTER — Encounter (HOSPITAL_COMMUNITY): Payer: Self-pay | Admitting: Obstetrics & Gynecology

## 2019-09-03 ENCOUNTER — Inpatient Hospital Stay (HOSPITAL_COMMUNITY)
Admission: AD | Admit: 2019-09-03 | Discharge: 2019-09-05 | DRG: 806 | Disposition: A | Discharge status: Home / Self Care | Payer: Medicaid Other | Attending: Obstetrics & Gynecology | Admitting: Obstetrics & Gynecology

## 2019-09-03 DIAGNOSIS — B951 Streptococcus, group B, as the cause of diseases classified elsewhere: Secondary | ICD-10-CM | POA: Diagnosis present

## 2019-09-03 DIAGNOSIS — Z20822 Contact with and (suspected) exposure to covid-19: Secondary | ICD-10-CM | POA: Diagnosis present

## 2019-09-03 DIAGNOSIS — O99824 Streptococcus B carrier state complicating childbirth: Secondary | ICD-10-CM | POA: Diagnosis present

## 2019-09-03 DIAGNOSIS — F4310 Post-traumatic stress disorder, unspecified: Secondary | ICD-10-CM | POA: Diagnosis present

## 2019-09-03 DIAGNOSIS — O99013 Anemia complicating pregnancy, third trimester: Secondary | ICD-10-CM

## 2019-09-03 DIAGNOSIS — Z148 Genetic carrier of other disease: Secondary | ICD-10-CM

## 2019-09-03 DIAGNOSIS — E71311 Medium chain acyl CoA dehydrogenase deficiency: Secondary | ICD-10-CM | POA: Diagnosis present

## 2019-09-03 DIAGNOSIS — O99344 Other mental disorders complicating childbirth: Secondary | ICD-10-CM | POA: Diagnosis present

## 2019-09-03 DIAGNOSIS — O98819 Other maternal infectious and parasitic diseases complicating pregnancy, unspecified trimester: Secondary | ICD-10-CM | POA: Diagnosis present

## 2019-09-03 DIAGNOSIS — O26893 Other specified pregnancy related conditions, third trimester: Secondary | ICD-10-CM | POA: Diagnosis present

## 2019-09-03 DIAGNOSIS — Z3A39 39 weeks gestation of pregnancy: Secondary | ICD-10-CM | POA: Diagnosis not present

## 2019-09-03 DIAGNOSIS — D649 Anemia, unspecified: Secondary | ICD-10-CM | POA: Diagnosis present

## 2019-09-03 DIAGNOSIS — O99284 Endocrine, nutritional and metabolic diseases complicating childbirth: Secondary | ICD-10-CM | POA: Diagnosis present

## 2019-09-03 DIAGNOSIS — Z3A40 40 weeks gestation of pregnancy: Secondary | ICD-10-CM | POA: Diagnosis not present

## 2019-09-03 DIAGNOSIS — O9902 Anemia complicating childbirth: Secondary | ICD-10-CM | POA: Diagnosis present

## 2019-09-03 DIAGNOSIS — F319 Bipolar disorder, unspecified: Secondary | ICD-10-CM | POA: Diagnosis present

## 2019-09-03 DIAGNOSIS — Z87891 Personal history of nicotine dependence: Secondary | ICD-10-CM

## 2019-09-03 DIAGNOSIS — O480 Post-term pregnancy: Secondary | ICD-10-CM | POA: Diagnosis present

## 2019-09-03 DIAGNOSIS — O2341 Unspecified infection of urinary tract in pregnancy, first trimester: Secondary | ICD-10-CM

## 2019-09-03 DIAGNOSIS — A749 Chlamydial infection, unspecified: Secondary | ICD-10-CM | POA: Diagnosis present

## 2019-09-03 DIAGNOSIS — Z3490 Encounter for supervision of normal pregnancy, unspecified, unspecified trimester: Secondary | ICD-10-CM

## 2019-09-03 DIAGNOSIS — Z229 Carrier of infectious disease, unspecified: Secondary | ICD-10-CM

## 2019-09-03 DIAGNOSIS — Z3493 Encounter for supervision of normal pregnancy, unspecified, third trimester: Secondary | ICD-10-CM

## 2019-09-03 LAB — RESPIRATORY PANEL BY RT PCR (FLU A&B, COVID)
Influenza A by PCR: NEGATIVE
Influenza B by PCR: NEGATIVE
SARS Coronavirus 2 by RT PCR: NEGATIVE

## 2019-09-03 LAB — URINALYSIS, ROUTINE W REFLEX MICROSCOPIC
Bilirubin Urine: NEGATIVE
Glucose, UA: NEGATIVE mg/dL
Ketones, ur: 5 mg/dL — AB
Nitrite: NEGATIVE
Protein, ur: 30 mg/dL — AB
Specific Gravity, Urine: 1.004 — ABNORMAL LOW (ref 1.005–1.030)
WBC, UA: >50 WBC/hpf — ABNORMAL HIGH (ref 0–5)
pH: 8 (ref 5.0–8.0)

## 2019-09-03 LAB — TYPE AND SCREEN
ABO/RH(D): O POS
Antibody Screen: NEGATIVE

## 2019-09-03 LAB — CBC
HCT: 34.2 % — ABNORMAL LOW (ref 36.0–46.0)
Hemoglobin: 11.1 g/dL — ABNORMAL LOW (ref 12.0–15.0)
MCH: 29.6 pg (ref 26.0–34.0)
MCHC: 32.5 g/dL (ref 30.0–36.0)
MCV: 91.2 fL (ref 80.0–100.0)
Platelets: 200 10*3/uL (ref 150–400)
RBC: 3.75 MIL/uL — ABNORMAL LOW (ref 3.87–5.11)
RDW: 12.5 % (ref 11.5–15.5)
WBC: 19.8 10*3/uL — ABNORMAL HIGH (ref 4.0–10.5)
nRBC: 0 % (ref 0.0–0.2)

## 2019-09-03 MED ORDER — SODIUM CHLORIDE (PF) 0.9 % IJ SOLN
INTRAMUSCULAR | Status: DC | PRN
  Administered 2019-09-03: 12 mL/h via EPIDURAL

## 2019-09-03 MED ORDER — VANCOMYCIN HCL IN DEXTROSE 1-5 GM/200ML-% IV SOLN
1000.0000 mg | Freq: Two times a day (BID) | INTRAVENOUS | Status: DC
  Filled 2019-09-03: qty 200

## 2019-09-03 MED ORDER — LACTATED RINGERS IV SOLN: 500.0000 mL | INTRAVENOUS | Status: DC | PRN

## 2019-09-03 MED ORDER — EPHEDRINE 5 MG/ML INJ: 10.0000 mg | INTRAVENOUS | Status: DC | PRN

## 2019-09-03 MED ORDER — LIDOCAINE HCL (PF) 1 % IJ SOLN
INTRAMUSCULAR | Status: DC | PRN
  Administered 2019-09-03: 10 mL via EPIDURAL
  Administered 2019-09-03: 2 mL via EPIDURAL

## 2019-09-03 MED ORDER — LACTATED RINGERS IV SOLN
500.0000 mL | Freq: Once | INTRAVENOUS | Status: AC
  Administered 2019-09-03: 23:00:00 500 mL via INTRAVENOUS

## 2019-09-03 MED ORDER — PHENYLEPHRINE 40 MCG/ML (10ML) SYRINGE FOR IV PUSH (FOR BLOOD PRESSURE SUPPORT): 80.0000 ug | PREFILLED_SYRINGE | INTRAVENOUS | Status: DC | PRN

## 2019-09-03 MED ORDER — LIDOCAINE HCL (PF) 1 % IJ SOLN
30.0000 mL | INTRAMUSCULAR | Status: DC | PRN
  Administered 2019-09-04: 01:00:00 30 mL via SUBCUTANEOUS
  Filled 2019-09-03: qty 30

## 2019-09-03 MED ORDER — OXYCODONE-ACETAMINOPHEN 5-325 MG PO TABS: 2.0000 | ORAL_TABLET | ORAL | Status: DC | PRN

## 2019-09-03 MED ORDER — DIPHENHYDRAMINE HCL 50 MG/ML IJ SOLN: 12.5000 mg | INTRAMUSCULAR | Status: DC | PRN

## 2019-09-03 MED ORDER — SOD CITRATE-CITRIC ACID 500-334 MG/5ML PO SOLN: 30.0000 mL | ORAL | Status: DC | PRN

## 2019-09-03 MED ORDER — FENTANYL-BUPIVACAINE-NACL 0.5-0.125-0.9 MG/250ML-% EP SOLN
EPIDURAL | Status: AC
  Filled 2019-09-03: qty 250

## 2019-09-03 MED ORDER — OXYCODONE-ACETAMINOPHEN 5-325 MG PO TABS: 1.0000 | ORAL_TABLET | ORAL | Status: DC | PRN

## 2019-09-03 MED ORDER — ACETAMINOPHEN 325 MG PO TABS: 650.0000 mg | ORAL_TABLET | ORAL | Status: DC | PRN

## 2019-09-03 MED ORDER — OXYTOCIN BOLUS FROM INFUSION: 500.0000 mL | Freq: Once | INTRAVENOUS | Status: AC

## 2019-09-03 MED ORDER — LACTATED RINGERS IV SOLN: INTRAVENOUS | Status: DC

## 2019-09-03 MED ORDER — FENTANYL CITRATE (PF) 100 MCG/2ML IJ SOLN
100.0000 ug | INTRAMUSCULAR | Status: DC | PRN
  Administered 2019-09-03: 100 ug via INTRAVENOUS
  Filled 2019-09-03: qty 2

## 2019-09-03 MED ORDER — ONDANSETRON HCL 4 MG/2ML IJ SOLN: 4.0000 mg | Freq: Four times a day (QID) | INTRAMUSCULAR | Status: DC | PRN

## 2019-09-03 MED ORDER — FENTANYL-BUPIVACAINE-NACL 0.5-0.125-0.9 MG/250ML-% EP SOLN: 12.0000 mL/h | EPIDURAL | Status: DC | PRN

## 2019-09-03 NOTE — Anesthesia Procedure Notes (Signed)
Epidural Patient location during procedure: OB Start time: 09/03/2019 11:20 PM End time: 09/03/2019 11:30 PM  Staffing Anesthesiologist: Lannie Fields, DO Performed: anesthesiologist   Preanesthetic Checklist Completed: patient identified, IV checked, risks and benefits discussed, monitors and equipment checked, pre-op evaluation and timeout performed  Epidural Patient position: sitting Prep: DuraPrep and site prepped and draped Patient monitoring: continuous pulse ox, blood pressure, heart rate and cardiac monitor Approach: midline Location: L3-L4 Injection technique: LOR air  Needle:  Needle type: Tuohy  Needle gauge: 17 G Needle length: 9 cm Needle insertion depth: 6 cm Catheter type: closed end flexible Catheter size: 19 Gauge Catheter at skin depth: 12 cm Test dose: negative  Assessment Sensory level: T8 Events: blood not aspirated, injection not painful, no injection resistance, no paresthesia and negative IV test  Additional Notes Patient identified. Risks/Benefits/Options discussed with patient including but not limited to bleeding, infection, nerve damage, paralysis, failed block, incomplete pain control, headache, blood pressure changes, nausea, vomiting, reactions to medication both or allergic, itching and postpartum back pain. Confirmed with bedside nurse the patient's most recent platelet count. Confirmed with patient that they are not currently taking any anticoagulation, have any bleeding history or any family history of bleeding disorders. Patient expressed understanding and wished to proceed. All questions were answered. Sterile technique was used throughout the entire procedure. Please see nursing notes for vital signs. Test dose was given through epidural catheter and negative prior to continuing to dose epidural or start infusion. Warning signs of high block given to the patient including shortness of breath, tingling/numbness in hands, complete motor  block, or any concerning symptoms with instructions to call for help. Patient was given instructions on fall risk and not to get out of bed. All questions and concerns addressed with instructions to call with any issues or inadequate analgesia.  Reason for block:procedure for pain

## 2019-09-03 NOTE — H&P (Signed)
LABOR AND DELIVERY ADMISSION HISTORY AND PHYSICAL NOTE  Julie Wilkinson is a 24 y.o. female [phone] with IUP at 67w6dby 6wk UKoreapresenting for spontaneous labor.   Has had contractions on and off all day.  She reports positive fetal movement. She denies leakage of fluid, vaginal bleeding.   She plans on breast feeding. Her contraception plan is: unsure.  [facility] , CWD, normal anatomy, cephalic presentation, posterior placenta, 52%ile, EFW [id number] Pregnancy complications:  - Bipolar 1, taking lithium and risperidone after consultation w MFM - GBS UTI - carrier for Acyl-CoA dehydrogenase - chlamydia infection during pregnancy, TOC neg 07/05/2019  Past Medical History: Past Medical History:  Diagnosis Date  . Anxiety 2017    Past Surgical History: Past Surgical History:  Procedure Laterality Date  . HYMENECTOMY  24years old    Obstetrical History: OB History    Gravida  4   Para  1   Term  1   Preterm      AB  2   Living  1     SAB  1   TAB  1   Ectopic      Multiple      Live Births  1           Social History: Social History   Socioeconomic History  . Marital status: Single    Spouse name: Not on file  . Number of children: Not on file  . Years of education: Not on file  . Highest education level: Not on file  Occupational History  . Not on file  Tobacco Use  . Smoking status: Former Smoker    Types: Cigarettes    Quit date: 2015    Years since quitting: 6.0  . Smokeless tobacco: Never Used  Substance and Sexual Activity  . Alcohol use: Not Currently  . Drug use: No  . Sexual activity: Yes    Birth control/protection: None  Other Topics Concern  . Not on file  Social History Narrative  . Not on file   Social Determinants of Health   Financial Resource Strain:   . Difficulty of Paying Living Expenses: Not on file  Food Insecurity: No Food Insecurity  . Worried About RSales executivein the Last Year: Never true  . Ran Out of Food in the Last Year: Never true  Transportation Needs: No Transportation Needs  . Lack of Transportation (Medical): No  . Lack of Transportation (Non-Medical): No  Physical Activity:   . Days of Exercise per Week: Not on file  . Minutes of Exercise per Session: Not on file  Stress:   . Feeling of Stress : Not on file  Social Connections:   . Frequency of Communication with Friends and Family: Not on file  . Frequency of Social Gatherings with Friends and Family: Not on file  . Attends Religious Services: Not on file  . Active Member of Clubs or Organizations: Not on file  . Attends CArchivistMeetings: Not on file  . Marital Status: Not on file    Family History: Family History  Problem Relation Age of Onset  . Bipolar disorder Mother   . Depression Father   . Bipolar disorder Sister     Allergies: Allergies  Allergen Reactions  . Loracarbef     Medications Prior to Admission  Medication Sig Dispense Refill Last Dose  . ondansetron (ZOFRAN) 8 MG tablet Take 1 tablet (8  mg total) by mouth every 8 (eight) hours as needed for nausea or vomiting. 30 tablet 0 09/03/2019 at 1930  . Prenatal Vit-Fe Fumarate-FA (MULTIVITAMIN-PRENATAL) 27-0.8 MG TABS tablet Take 1 tablet by mouth daily at 12 noon.   09/02/2019 at Unknown time  . AMBULATORY NON FORMULARY MEDICATION 1 Device by Other route once a week. Blood pressure cuff/ regular  Monitored regularly at home ICD 10: Z34.90 1 kit 0      Review of Systems  All systems reviewed and negative except as stated in HPI  Physical Exam Blood pressure 104/61, pulse 100, temperature 98.7 F (37.1 C), last menstrual period 11/28/2018, unknown if currently breastfeeding. General appearance: alert, oriented, NAD Lungs: normal respiratory effort Heart: regular rate Abdomen: soft, non-tender; gravid, leopolds 2600g Extremities: No calf swelling or tenderness Presentation: cephalic  by BSUS  Fetal monitoringBaseline: 150 bpm, Variability: Good {> 6 bpm), Accelerations: Non-reactive but appropriate for gestational age and Decelerations: Absent Uterine activityFrequency: Every 2-3 minutes  Dilation: 6 Effacement (%): 80 Station: Ballotable Exam by:: Glena Norfolk, RN  Prenatal labs: ABO, Rh: O/Positive/-- (07/01 1459) Antibody: Negative (07/01 1459) Rubella: 1.25 (07/01 1459) RPR: Non Reactive (11/05 0951)  HBsAg: Negative (07/01 1459)  HIV: Non Reactive (11/05 0951)  GC/Chlamydia: neg/neg 08/15/2019  GBS:   positive by UCx 2-hr GTT: normal 06/15/2019 Genetic screening:  Low risk Panorama 02/08/2019 Anatomy US: normal  Prenatal Transfer Tool  Maternal Diabetes: No Genetic Screening: Normal Maternal Ultrasounds/Referrals: Normal Fetal Ultrasounds or other Referrals:  None Maternal Substance Abuse:  No Significant Maternal Medications:  Meds include: Other: lithium, risperidone Significant Maternal Lab Results: Group B Strep positive  No results found for this or any previous visit (from the past 24 hour(s)).  Patient Active Problem List   Diagnosis Date Noted  . Anemia in pregnancy 06/16/2019  . Bipolar 1 disorder ([place]) 06/07/2019  . 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 Group B streptococcus urinary tract infection affecting pregnancy 02/13/2019  . Supervision of low-risk pregnancy 01/30/2019  . Chlamydia infection affecting pregnancy 01/16/2019  . PTSD (post-traumatic stress disorder) 05/17/2017    Assessment: Julie Wilkinson is a 24 y.o. [id number] at 67w6dhere for spontaneous labor.   #Labor: expectant management while receiving initial dose of penicillin #Pain: IV pain meds PRN, requests epidural #FWB: Cat I #GBS/ID: positive by UCx with clindamycin resistance and hx of rash with 2nd generation cephalosporin, written for Vancomycin #COVID: swab pending #MOF: breast #MOC: unsure #Circ: n/a  #Bipolar I: Reports  has not been taking lithium or risperidone  MClarnce Flock1/24/2021, 10:14 PM

## 2019-09-03 NOTE — MAU Note (Signed)
Pt reports to MAU for contractions that she has had all day but are getting worse, she states they are 3-5 min aprt, reports some bloody mucus, denies LOF +fetal movement.

## 2019-09-03 NOTE — Anesthesia Preprocedure Evaluation (Addendum)
Anesthesia Evaluation  Patient identified by MRN, date of birth, ID band Patient awake    Reviewed: Allergy & Precautions, Patient's Chart, lab work & pertinent test results  Airway Mallampati: II  TM Distance: >3 FB Neck ROM: Full    Dental no notable dental hx.    Pulmonary former smoker,    Pulmonary exam normal breath sounds clear to auscultation       Cardiovascular negative cardio ROS Normal cardiovascular exam Rhythm:Regular Rate:Normal     Neuro/Psych PSYCHIATRIC DISORDERS Anxiety Bipolar Disorder negative neurological ROS     GI/Hepatic negative GI ROS, Neg liver ROS,   Endo/Other  negative endocrine ROS  Renal/GU negative Renal ROS  negative genitourinary   Musculoskeletal negative musculoskeletal ROS (+)   Abdominal   Peds negative pediatric ROS (+)  Hematology  (+) Blood dyscrasia, anemia , hct 34.2, plt 200   Anesthesia Other Findings   Reproductive/Obstetrics (+) Pregnancy Prior epidural at [facility]- one sided, needed to be replaced once. No postpartum issues  Significant scoliosis                            Anesthesia Physical Anesthesia Plan  ASA: II and emergent  Anesthesia Plan: Epidural   Post-op Pain Management:    Induction:   PONV Risk Score and Plan: 2  Airway Management Planned: Natural Airway  Additional Equipment: None  Intra-op Plan:   Post-operative Plan:   Informed Consent: I have reviewed the patients History and Physical, chart, labs and discussed the procedure including the risks, benefits and alternatives for the proposed anesthesia with the patient or authorized representative who has indicated his/her understanding and acceptance.       Plan Discussed with:   Anesthesia Plan Comments:         Anesthesia Quick Evaluation

## 2019-09-04 ENCOUNTER — Encounter: Payer: Medicaid Other | Admitting: Family Medicine

## 2019-09-04 ENCOUNTER — Other Ambulatory Visit: Payer: Medicaid Other

## 2019-09-04 DIAGNOSIS — Z3A40 40 weeks gestation of pregnancy: Secondary | ICD-10-CM

## 2019-09-04 DIAGNOSIS — O99824 Streptococcus B carrier state complicating childbirth: Secondary | ICD-10-CM

## 2019-09-04 LAB — RPR: RPR Ser Ql: NONREACTIVE

## 2019-09-04 MED ORDER — ZOLPIDEM TARTRATE 5 MG PO TABS: 5.0000 mg | ORAL_TABLET | Freq: Every evening | ORAL | Status: DC | PRN

## 2019-09-04 MED ORDER — ONDANSETRON HCL 4 MG PO TABS: 4.0000 mg | ORAL_TABLET | ORAL | Status: DC | PRN

## 2019-09-04 MED ORDER — DIPHENHYDRAMINE HCL 25 MG PO CAPS: 25.0000 mg | ORAL_CAPSULE | Freq: Four times a day (QID) | ORAL | Status: DC | PRN

## 2019-09-04 MED ORDER — PRENATAL MULTIVITAMIN CH
1.0000 | ORAL_TABLET | Freq: Every day | ORAL | Status: DC
  Administered 2019-09-04 – 2019-09-05 (×2): 1 via ORAL
  Filled 2019-09-04 (×2): qty 1

## 2019-09-04 MED ORDER — DIBUCAINE (PERIANAL) 1 % EX OINT: 1.0000 "application " | TOPICAL_OINTMENT | CUTANEOUS | Status: DC | PRN

## 2019-09-04 MED ORDER — COCONUT OIL OIL: 1.0000 "application " | TOPICAL_OIL | Status: DC | PRN

## 2019-09-04 MED ORDER — OXYTOCIN 40 UNITS IN NORMAL SALINE INFUSION - SIMPLE MED: 10.0000 [IU]/h | Freq: Once | INTRAVENOUS | Status: DC | PRN

## 2019-09-04 MED ORDER — ONDANSETRON HCL 4 MG/2ML IJ SOLN: 4.0000 mg | INTRAMUSCULAR | Status: DC | PRN

## 2019-09-04 MED ORDER — TETANUS-DIPHTH-ACELL PERTUSSIS 5-2.5-18.5 LF-MCG/0.5 IM SUSP: 0.5000 mL | Freq: Once | INTRAMUSCULAR | Status: DC

## 2019-09-04 MED ORDER — IBUPROFEN 600 MG PO TABS
600.0000 mg | ORAL_TABLET | Freq: Four times a day (QID) | ORAL | Status: DC
  Administered 2019-09-04 – 2019-09-05 (×7): 600 mg via ORAL
  Filled 2019-09-04 (×7): qty 1

## 2019-09-04 MED ORDER — WITCH HAZEL-GLYCERIN EX PADS: 1.0000 "application " | MEDICATED_PAD | CUTANEOUS | Status: DC | PRN

## 2019-09-04 MED ORDER — SIMETHICONE 80 MG PO CHEW: 80.0000 mg | CHEWABLE_TABLET | ORAL | Status: DC | PRN

## 2019-09-04 MED ORDER — OXYTOCIN 40 UNITS IN NORMAL SALINE INFUSION - SIMPLE MED
INTRAVENOUS | Status: AC
  Administered 2019-09-04: 01:00:00 500 mL via INTRAVENOUS
  Filled 2019-09-04: qty 1000

## 2019-09-04 MED ORDER — SENNOSIDES-DOCUSATE SODIUM 8.6-50 MG PO TABS
2.0000 | ORAL_TABLET | ORAL | Status: DC
  Administered 2019-09-05: 2 via ORAL
  Filled 2019-09-04: qty 2

## 2019-09-04 MED ORDER — ACETAMINOPHEN 325 MG PO TABS
650.0000 mg | ORAL_TABLET | ORAL | Status: DC | PRN
  Administered 2019-09-04: 09:00:00 650 mg via ORAL
  Filled 2019-09-04: qty 2

## 2019-09-04 MED ORDER — BENZOCAINE-MENTHOL 20-0.5 % EX AERO: 1.0000 "application " | INHALATION_SPRAY | CUTANEOUS | Status: DC | PRN

## 2019-09-04 NOTE — Discharge Summary (Signed)
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 yo [id number] admitted in Active Labor on 09/03/2019. Patient had an uncomplicated labor course as follows: arrived at 6cm, augmented with AROM and progressed to complete.  Membrane Rupture Time/Date: 11:43 PM ,09/03/2019   Intrapartum Procedures: Episiotomy: None [1]                                         Lacerations:  1st degree [2];Labial [10]  Patient had a delivery of a Viable infant. 09/04/2019  Information for the patient's newborn:  KiMayumi, Summerson0[[id number]]Delivery Method: Vaginal, Spontaneous(Filed from Delivery Summary)     Pateint had an uncomplicated postpartum course.  She is ambulating, tolerating a regular diet, passing flatus, and urinating well. Patient is discharged home in stable condition on 09/05/19.  Delivery time: 12:27 AM    Magnesium Sulfate received: No BMZ received: No Rhophylac:N/A MMR:N/A Transfusion:No  Physical exam  Vitals:   09/04/19 1144 09/04/19 1530 09/04/19 1948 09/05/19 0501  BP: 100/63 (!) 100/53 (!) 97/59 93/60  Pulse: 71 66 70 (!) 58  Resp: _0 Temp: 97.7 F (36.5 C)  98 F (36.7 C) 98 F (36.7 C)  TempSrc: Oral  Oral Oral  SpO2: 98% 98% 100%    General: alert, cooperative and no distress Lochia: appropriate Uterine Fundus: firm Incision: N/A DVT Evaluation: No evidence of DVT seen on physical exam. No significant calf/ankle edema. Labs: Lab Results  Component Value Date   WBC 19.8 (H) 09/03/2019   HGB 11.1 (L) 09/03/2019   HCT 34.2 (L) 09/03/2019   MCV 91.2 09/03/2019   PLT 200 09/03/2019   CMP Latest Ref Rng & Units 02/08/2019  Glucose 65 - 99 mg/dL 78  BUN 6 - 20 mg/dL 9  Creatinine 0.57 - 1.00 mg/dL 0.59  Sodium 134 - 144 mmol/L 137  Potassium 3.5 - 5.2 mmol/L 4.4  Chloride 96 - 106 mmol/L 101  CO2 20 - 29 mmol/L 21  Calcium 8.7 - 10.2 mg/dL 9.3  Total Protein 6.0 - 8.5 g/dL 6.8  Total Bilirubin 0.0 - 1.2 mg/dL 0.8  Alkaline Phos 39 - 117 IU/L 47  AST 0 - 40 IU/L 20  ALT 0 - 32 IU/L 12    Discharge instruction: per After Visit Summary and "Baby and Me Booklet".  After visit meds:  Allergies as of 09/05/2019      Reactions   Loracarbef       Medication List    TAKE these medications   acetaminophen 325 MG tablet Commonly known as: Tylenol Take 2 tablets (650 mg total) by mouth every 4 (four) hours as needed (for pain scale < 4).   AMBULATORY NON FORMULARY MEDICATION 1 Device by Other route once a week. Blood pressure cuff/ regular  Monitored regularly at home ICD 10: Z34.90   ibuprofen 600 MG tablet Commonly known as: ADVIL Take 1 tablet (600 mg total) by mouth every 6 (six) hours.   multivitamin-prenatal 27-0.8 MG Tabs tablet Take 1 tablet by mouth daily at 12 noon.   ondansetron 8 MG tablet Commonly known as: Zofran Take 1 tablet (8 mg total) by mouth every 8 (eight) hours as needed for nausea or vomiting.   polyethylene glycol powder 17 GM/SCOOP powder Commonly known as: GLYCOLAX/MIRALAX Take 255 g by mouth once for 1 dose.       Diet: routine diet  Activity:  Advance as tolerated. Pelvic rest for 6 weeks.   Outpatient follow up:6 weeks Follow up Appt: Future Appointments  Date Time Provider Department Center  09/12/2019  2:15 PM [facility]  10/10/2019  3:55 PM Burleson, Terri L, NP [facility]   Follow up Visit:   Please schedule this patient for Postpartum visit in: 6 weeks with the following provider: Any provider Virtual For C/S patients schedule nurse incision check in weeks 2 weeks: no High risk pregnancy complicated by: bipolar I Delivery mode:  SVD Anticipated Birth Control:  other/unsure PP Procedures needed: none  Schedule Integrated BH visit: connected with outside BH, needs mood check in and follow up to ensure attending visits with outside BH   Newborn Data: Live born female  Birth Weight:  3105 grams APGAR: 8, 9  Newborn Delivery   Birth date/time: 09/04/2019 00:27:00 Delivery type: Vaginal, Spontaneous      Baby Feeding: Breast Disposition:home with mother   09/05/2019 Matthew M Eckstat, MD 

## 2019-09-04 NOTE — Lactation Note (Signed)
This note was copied from a baby's chart. Lactation Consultation Note  Patient Name: Julie Wilkinson EHMCN'O Date: 09/04/2019 Reason for consult: Initial assessment;Term;Other (Comment)(experienced BF x 3 months)  Baby is 9 hours old  Per mom baby last fed at 850 am for 10 mins with swallows.  LC noted the baby to be fully dressed .  LC checked the baby's diaper and placed baby STS with mom, awake for very short  Time. LC showed mom hoe to hand express with few drops and mom able to repeat demo well. LC with gloved fingers massaged the baby's gums with drops and her lips.  Per mom I never hand expressed with mom 1st one or fed in the football position and that's why I probably got so sore.  LC reviewed importance of STS and benefits, hand expressing, reverse pressure due some areola edema with compression after reverse pressure.  Per mom plans to just breast feed and will be staying home.  Has a hand pump at home , active with [facility].  LC provided the [facility] pamphlet with phone numbers and encouraged mom to call for latch assessment .    Maternal Data Has patient been taught Hand Expression?: Yes Does the patient have breastfeeding experience prior to this delivery?: Yes  Feeding Feeding Type: Breast Fed  LATCH Score Latch: Too sleepy or reluctant, no latch achieved, no sucking elicited.  Audible Swallowing: None  Type of Nipple: Everted at rest and after stimulation  Comfort (Breast/Nipple): Soft / non-tender  Hold (Positioning): Assistance needed to correctly position infant at breast and maintain latch.  LATCH Score: 5  Interventions Interventions: Breast feeding basics reviewed;Assisted with latch;Skin to skin;Breast massage;Hand express;Reverse pressure  Lactation Tools Discussed/Used WIC Program: Yes   Consult Status Consult Status: Follow-up Date: 09/05/19 Follow-up type: In-patient    Matilde Sprang Diarra Kos 09/04/2019, 9:33 AM

## 2019-09-04 NOTE — Anesthesia Postprocedure Evaluation (Signed)
Anesthesia Post Note  Patient: Julie Wilkinson  Procedure(s) Performed: AN AD HOC LABOR EPIDURAL     Patient location during evaluation: Mother Baby Anesthesia Type: Epidural Level of consciousness: awake and alert Pain management: pain level controlled Vital Signs Assessment: post-procedure vital signs reviewed and stable Respiratory status: spontaneous breathing, nonlabored ventilation and respiratory function stable Cardiovascular status: stable Postop Assessment: no headache, no backache and epidural receding Anesthetic complications: no Comments: Per telephone conversation    Last Vitals:  Vitals:   09/04/19 0410 09/04/19 0805  BP: 107/63 99/66  Pulse: 80 63  Resp: 20 20  Temp: 36.8 C 36.7 C  SpO2: 99% 97%    Last Pain:  Vitals:   09/04/19 0805  TempSrc: Oral  PainSc: 2    Pain Goal: Patients Stated Pain Goal: 0 (09/03/19 2255)              Epidural/Spinal Function Cutaneous sensation: Normal sensation (09/04/19 0805), Patient able to flex knees: Yes (09/04/19 0805), Patient able to lift hips off bed: Yes (09/04/19 0805), Back pain beyond tenderness at insertion site: No (09/04/19 0805), Progressively worsening motor and/or sensory loss: No (09/04/19 0805), Bowel and/or bladder incontinence post epidural: No (09/04/19 0805)  Emmaline Kluver N

## 2019-09-04 NOTE — Progress Notes (Signed)
Julie Wilkinson  Post Partum Day 0:S/P SVD with repair of Bilateral Labial Lacerations with Right Side Repair  Subjective: Patient up ad lib, denies syncope or dizziness. Reports consuming regular diet without issues and denies N/V. Denies issues with urination and reports bleeding is "good..I guess."  Patient is breastfeeding and reports going well despite .  Unsure of method desired for postpartum contraception.  Pain is being appropriately managed with use of tylenol and ibuprofen.  Objective: Vitals:   09/04/19 0312 09/04/19 0410 09/04/19 0805 09/04/19 1144  BP: (!) 106/51 107/63 99/66 100/63  Pulse: 71 80 63 71  Resp: 18 20 20 17   Temp: 97.9 F (36.6 C) 98.2 F (36.8 C) 98.1 F (36.7 C) 97.7 F (36.5 C)  TempSrc: Oral Oral Oral Oral  SpO2: 99% 99% 97% 98%   Recent Labs    09/03/19 2243  HGB 11.1*  HCT 34.2*    Physical Exam:  General: alert, cooperative and no distress Mood/Affect: Appropriate/Bright Lungs: clear to auscultation, no wheezes, rales or rhonchi, symmetric air entry.  Heart: normal rate and regular rhythm. Breast: not examined. Abdomen:  + bowel sounds, Soft, NT Uterine Fundus: firm, U/-2 Lochia: appropriate Laceration: Not Assessed Skin: Warm, Dry DVT Evaluation: No significant calf/ankle edema.  Assessment S/P Vaginal Delivery-Day 0 Normal [facility] BreastFeeding  Plan: -Discussed that she can be discharged tomorrow morning if desired. -Encouraged to utilize lactation consultants while in hospital. -No questions or concerns. -Contraception methods listed on discharge AVS.  -Continue current care -LD team to be updated on patient status   09/05/19, MSN, CNM 09/04/2019, 11:52 AM

## 2019-09-05 ENCOUNTER — Encounter (HOSPITAL_COMMUNITY): Payer: Medicaid Other

## 2019-09-05 ENCOUNTER — Inpatient Hospital Stay (HOSPITAL_COMMUNITY): Payer: Medicaid Other

## 2019-09-05 ENCOUNTER — Inpatient Hospital Stay (HOSPITAL_COMMUNITY): Admission: AD | Admit: 2019-09-05 | Payer: Medicaid Other | Source: Home / Self Care | Admitting: Family Medicine

## 2019-09-05 MED ORDER — POLYETHYLENE GLYCOL 3350 17 GM/SCOOP PO POWD: 1.0000 | Freq: Once | ORAL | 0 refills | Status: AC

## 2019-09-05 MED ORDER — ACETAMINOPHEN 325 MG PO TABS: 650.0000 mg | ORAL_TABLET | ORAL | 0 refills | Status: DC | PRN

## 2019-09-05 MED ORDER — IBUPROFEN 600 MG PO TABS: 600.0000 mg | ORAL_TABLET | Freq: Four times a day (QID) | ORAL | 0 refills | Status: DC

## 2019-09-05 NOTE — Lactation Note (Signed)
This note was copied from a baby's chart. Lactation Consultation Note  Patient Name: Julie Wilkinson KQASU'O Date: 09/05/2019   P2, Baby 33 hours old.  Baby sucking on pacifier.  Pacifier use not recommended at this time. Provided education. Feed on demand with cues.  Goal 8-12+ times per day after first 24 hrs.  Place baby STS if not cueing.  Reviewed engorgement care and monitoring voids/stools.       Maternal Data    Feeding Feeding Type: Breast Fed  LATCH Score Latch: Grasps breast easily, tongue down, lips flanged, rhythmical sucking.  Audible Swallowing: A few with stimulation  Type of Nipple: Everted at rest and after stimulation  Comfort (Breast/Nipple): Soft / non-tender  Hold (Positioning): No assistance needed to correctly position infant at breast.  LATCH Score: 9  Interventions    Lactation Tools Discussed/Used     Consult Status      Dahlia Byes [facility] 09/05/2019, 10:15 AM

## 2019-09-05 NOTE — Discharge Instructions (Signed)
Contraception Choices Contraception, also called birth control, refers to methods or devices that prevent pregnancy. Hormonal methods Contraceptive implant  A contraceptive implant is a thin, plastic tube that contains a hormone. It is inserted into the upper part of the arm. It can remain in place for up to 3 years. Progestin-only injections Progestin-only injections are injections of progestin, a synthetic form of the hormone progesterone. They are given every 3 months by a health care provider. Birth control pills  Birth control pills are pills that contain hormones that prevent pregnancy. They must be taken once a day, preferably at the same time each day. Birth control patch  The birth control patch contains hormones that prevent pregnancy. It is placed on the skin and must be changed once a week for three weeks and removed on the fourth week. A prescription is needed to use this method of contraception. Vaginal ring  A vaginal ring contains hormones that prevent pregnancy. It is placed in the vagina for three weeks and removed on the fourth week. After that, the process is repeated with a new ring. A prescription is needed to use this method of contraception. Emergency contraceptive Emergency contraceptives prevent pregnancy after unprotected sex. They come in pill form and can be taken up to 5 days after sex. They work best the sooner they are taken after having sex. Most emergency contraceptives are available without a prescription. This method should not be used as your only form of birth control. Barrier methods Female condom  A female condom is a thin sheath that is worn over the penis during sex. Condoms keep sperm from going inside a woman's body. They can be used with a spermicide to increase their effectiveness. They should be disposed after a single use. Female condom  A female condom is a soft, loose-fitting sheath that is put into the vagina before sex. The condom keeps sperm  from going inside a woman's body. They should be disposed after a single use. Diaphragm  A diaphragm is a soft, dome-shaped barrier. It is inserted into the vagina before sex, along with a spermicide. The diaphragm blocks sperm from entering the uterus, and the spermicide kills sperm. A diaphragm should be left in the vagina for 6-8 hours after sex and removed within 24 hours. A diaphragm is prescribed and fitted by a health care provider. A diaphragm should be replaced every 1-2 years, after giving birth, after gaining more than 15 lb (6.8 kg), and after pelvic surgery. Cervical cap  A cervical cap is a round, soft latex or plastic cup that fits over the cervix. It is inserted into the vagina before sex, along with spermicide. It blocks sperm from entering the uterus. The cap should be left in place for 6-8 hours after sex and removed within 48 hours. A cervical cap must be prescribed and fitted by a health care provider. It should be replaced every 2 years. Sponge  A sponge is a soft, circular piece of polyurethane foam with spermicide on it. The sponge helps block sperm from entering the uterus, and the spermicide kills sperm. To use it, you make it wet and then insert it into the vagina. It should be inserted before sex, left in for at least 6 hours after sex, and removed and thrown away within 30 hours. Spermicides Spermicides are chemicals that kill or block sperm from entering the cervix and uterus. They can come as a cream, jelly, suppository, foam, or tablet. A spermicide should be inserted into the   vagina with an applicator at least 10-15 minutes before sex to allow time for it to work. The process must be repeated every time you have sex. Spermicides do not require a prescription. Intrauterine contraception Intrauterine device (IUD) An IUD is a T-shaped device that is put in a woman's uterus. There are two types:  Hormone IUD.This type contains progestin, a synthetic form of the hormone  progesterone. This type can stay in place for 3-5 years.  Copper IUD.This type is wrapped in copper wire. It can stay in place for 10 years.  Permanent methods of contraception Female tubal ligation In this method, a woman's fallopian tubes are sealed, tied, or blocked during surgery to prevent eggs from traveling to the uterus. Hysteroscopic sterilization In this method, a small, flexible insert is placed into each fallopian tube. The inserts cause scar tissue to form in the fallopian tubes and block them, so sperm cannot reach an egg. The procedure takes about 3 months to be effective. Another form of birth control must be used during those 3 months. Female sterilization This is a procedure to tie off the tubes that carry sperm (vasectomy). After the procedure, the man can still ejaculate fluid (semen). Natural planning methods Natural family planning In this method, a couple does not have sex on days when the woman could become pregnant. Calendar method This means keeping track of the length of each menstrual cycle, identifying the days when pregnancy can happen, and not having sex on those days. Ovulation method In this method, a couple avoids sex during ovulation. Symptothermal method This method involves not having sex during ovulation. The woman typically checks for ovulation by watching changes in her temperature and in the consistency of cervical mucus. Post-ovulation method In this method, a couple waits to have sex until after ovulation. Summary  Contraception, also called birth control, means methods or devices that prevent pregnancy.  Hormonal methods of contraception include implants, injections, pills, patches, vaginal rings, and emergency contraceptives.  Barrier methods of contraception can include female condoms, female condoms, diaphragms, cervical caps, sponges, and spermicides.  There are two types of IUDs (intrauterine devices). An IUD can be put in a woman's uterus to  prevent pregnancy for 3-5 years.  Permanent sterilization can be done through a procedure for males, females, or both.  Natural family planning methods involve not having sex on days when the woman could become pregnant. This information is not intended to replace advice given to you by your health care provider. Make sure you discuss any questions you have with your health care provider. Document Revised: 07/29/2017 Document Reviewed: 08/29/2016 Elsevier Patient Education  2020 Elsevier Inc. Postpartum Care After Vaginal Delivery This sheet gives you information about how to care for yourself from the time you deliver your baby to up to 6-12 weeks after delivery (postpartum period). Your health care provider may also give you more specific instructions. If you have problems or questions, contact your health care provider. Follow these instructions at home: Vaginal bleeding  It is normal to have vaginal bleeding (lochia) after delivery. Wear a sanitary pad for vaginal bleeding and discharge. ? During the first week after delivery, the amount and appearance of lochia is often similar to a menstrual period. ? Over the next few weeks, it will gradually decrease to a dry, yellow-brown discharge. ? For most women, lochia stops completely by 4-6 weeks after delivery. Vaginal bleeding can vary from woman to woman.  Change your sanitary pads frequently. Watch for any changes   in your flow, such as: ? A sudden increase in volume. ? A change in color. ? Large blood clots.  If you pass a blood clot from your vagina, save it and call your health care provider to discuss. Do not flush blood clots down the toilet before talking with your health care provider.  Do not use tampons or douches until your health care provider says this is safe.  If you are not breastfeeding, your period should return 6-8 weeks after delivery. If you are feeding your child breast milk only (exclusive breastfeeding), your  period may not return until you stop breastfeeding. Perineal care  Keep the area between the vagina and the anus (perineum) clean and dry as told by your health care provider. Use medicated pads and pain-relieving sprays and creams as directed.  If you had a cut in the perineum (episiotomy) or a tear in the vagina, check the area for signs of infection until you are healed. Check for: ? More redness, swelling, or pain. ? Fluid or blood coming from the cut or tear. ? Warmth. ? Pus or a bad smell.  You may be given a squirt bottle to use instead of wiping to clean the perineum area after you go to the bathroom. As you start healing, you may use the squirt bottle before wiping yourself. Make sure to wipe gently.  To relieve pain caused by an episiotomy, a tear in the vagina, or swollen veins in the anus (hemorrhoids), try taking a warm sitz bath 2-3 times a day. A sitz bath is a warm water bath that is taken while you are sitting down. The water should only come up to your hips and should cover your buttocks. Breast care  Within the first few days after delivery, your breasts may feel heavy, full, and uncomfortable (breast engorgement). Milk may also leak from your breasts. Your health care provider can suggest ways to help relieve the discomfort. Breast engorgement should go away within a few days.  If you are breastfeeding: ? Wear a bra that supports your breasts and fits you well. ? Keep your nipples clean and dry. Apply creams and ointments as told by your health care provider. ? You may need to use breast pads to absorb milk that leaks from your breasts. ? You may have uterine contractions every time you breastfeed for up to several weeks after delivery. Uterine contractions help your uterus return to its normal size. ? If you have any problems with breastfeeding, work with your health care provider or lactation consultant.  If you are not breastfeeding: ? Avoid touching your breasts a  lot. Doing this can make your breasts produce more milk. ? Wear a good-fitting bra and use cold packs to help with swelling. ? Do not squeeze out (express) milk. This causes you to make more milk. Intimacy and sexuality  Ask your health care provider when you can engage in sexual activity. This may depend on: ? Your risk of infection. ? How fast you are healing. ? Your comfort and desire to engage in sexual activity.  You are able to get pregnant after delivery, even if you have not had your period. If desired, talk with your health care provider about methods of birth control (contraception). Medicines  Take over-the-counter and prescription medicines only as told by your health care provider.  If you were prescribed an antibiotic medicine, take it as told by your health care provider. Do not stop taking the antibiotic even if you   start to feel better. Activity  Gradually return to your normal activities as told by your health care provider. Ask your health care provider what activities are safe for you.  Rest as much as possible. Try to rest or take a nap while your baby is sleeping. Eating and drinking   Drink enough fluid to keep your urine pale yellow.  Eat high-fiber foods every day. These may help prevent or relieve constipation. High-fiber foods include: ? Whole grain cereals and breads. ? Brown rice. ? Beans. ? Fresh fruits and vegetables.  Do not try to lose weight quickly by cutting back on calories.  Take your prenatal vitamins until your postpartum checkup or until your health care provider tells you it is okay to stop. Lifestyle  Do not use any products that contain nicotine or tobacco, such as cigarettes and e-cigarettes. If you need help quitting, ask your health care provider.  Do not drink alcohol, especially if you are breastfeeding. General instructions  Keep all follow-up visits for you and your baby as told by your health care provider. Most women visit  their health care provider for a postpartum checkup within the first 3-6 weeks after delivery. Contact a health care provider if:  You feel unable to cope with the changes that your child brings to your life, and these feelings do not go away.  You feel unusually sad or worried.  Your breasts become red, painful, or hard.  You have a fever.  You have trouble holding urine or keeping urine from leaking.  You have little or no interest in activities you used to enjoy.  You have not breastfed at all and you have not had a menstrual period for 12 weeks after delivery.  You have stopped breastfeeding and you have not had a menstrual period for 12 weeks after you stopped breastfeeding.  You have questions about caring for yourself or your baby.  You pass a blood clot from your vagina. Get help right away if:  You have chest pain.  You have difficulty breathing.  You have sudden, severe leg pain.  You have severe pain or cramping in your lower abdomen.  You bleed from your vagina so much that you fill more than one sanitary pad in one hour. Bleeding should not be heavier than your heaviest period.  You develop a severe headache.  You faint.  You have blurred vision or spots in your vision.  You have bad-smelling vaginal discharge.  You have thoughts about hurting yourself or your baby. If you ever feel like you may hurt yourself or others, or have thoughts about taking your own life, get help right away. You can go to the nearest emergency department or call:  Your local emergency services (911 in the [place]  A suicide crisis helpline, such as the National Suicide Prevention Lifeline at 1-800-273-8255. This is open 24 hours a day. Summary  The period of time right after you deliver your newborn up to 6-12 weeks after delivery is called the postpartum period.  Gradually return to your normal activities as told by your health care provider.  Keep all follow-up visits for  you and your baby as told by your health care provider. This information is not intended to replace advice given to you by your health care provider. Make sure you discuss any questions you have with your health care provider. Document Revised: 07/30/2017 Document Reviewed: 05/10/2017 Elsevier Patient Education  2020 Elsevier Inc.  

## 2019-09-05 NOTE — Clinical Social Work Maternal (Signed)
CLINICAL SOCIAL WORK MATERNAL/CHILD NOTE  Patient Details  Name: Julie Wilkinson MRN: [id number] Date of Birth: 07/26/1996  Date:  09/02/2019  Clinical Social Worker Initiating Note:  Hortencia Pilar, LCSW Date/Time: Initiated:  09/05/19/0913     Child's Name:  Julie Wilkinson   Biological Parents:  Mother, Father   Need for Interpreter:  None   Reason for Referral:  Behavioral Health Concerns   Address:  [address] [place] [address]    Phone number:  [phone] (home)     Additional phone number: none   Household Members/Support Persons (HM/SP):   Household Member/Support Person 2   HM/SP Name Relationship DOB or Age  HM/SP -1   [name]  FOB     HM/SP -2 Julie Wilkinson MOB  24  HM/SP -3   [name]  son   5  HM/SP -4        HM/SP -5        HM/SP -6        HM/SP -7        HM/SP -8          Natural Supports (not living in the home):      Professional Supports: None   Employment: Unemployed   Type of Work: [employer]   Education:  High school graduate   Homebound arranged:  n/a  Surveyor, quantity Resources:  Medicaid   Other Resources:  Sales executive , WIC   Cultural/Religious Considerations Which May Impact Care:  none reported.   Strengths:  Ability to meet basic needs , Compliance with medical plan , Home prepared for child , Psychotropic Medications, Pediatrician chosen   Psychotropic Medications:  Lithium, Risperdal      Pediatrician:    [facility] (including [place])  Pediatrician List:   [facility])    Pediatrician Fax Number:    Risk Factors/Current Problems:  None   Cognitive State:  Able to Concentrate , Insightful , Alert    Mood/Affect:  Happy , Interested , Relaxed , Calm , Comfortable    CSW Assessment: CSW consulted as MOB has a history of Bipolar, PTSD, Anxiety, and scored 11 on [place].  CSW went to speak with MOB at bedside to address further needs.   CSW entered the room and congratulated MOB and FOB on the birth of infant. CSW advised MOB and FOB of HIPPA policy and MOB requested that FOB remain in the room. CSW understanding and proceeded with assessing MOB. CSW advised MOB of CSW's roles and the reason for CSW coming to speak with her. MOB reports that she was diagnosed with Bipolar about 4 months ago. MOB reports that she was prescribed a medication at that time in which she hasn't taken due to pregnancy. MOB reports that she has plans to start the medication since she has given birth. MOB also expressed that she was diagnosed with PTSD a few years ago. MOB reported that she has been feeling okay without her medications. MOB also advised CSW that in 2017 she was diagnosed with anxiety. MOB again advised CSW that she has bee doing and feeling fine without therapy and meds. CSW offered MOB resources for therapy in the event that she feels that she needs them, however MOB declined resources at this time moment.   CSW was informed that MOB has support from [facility]. MOB  is from home with her partner and 5 year old son Joey. MOB informed CSW that she has all needed items to care for infant with no other needs currently. CSW provided MOB with PPD and SIDS education. MOB was given PPD Checklist in order for her to track feeling as they may relate to PPD.  CSW Plan/Description:  No Further Intervention Required/No Barriers to Discharge, Sudden Infant Death Syndrome (SIDS) Education, Perinatal Mood and Anxiety Disorder (PMADs) Education    Kerriann Kamphuis S Joram Venson, LCSWA 09/05/2019, 9:17 AM 

## 2019-09-11 NOTE — BH Specialist Note (Signed)
Pt did not arrive to video visit and did not answer the phone ; Left HIPPA-compliant message to call back [name] from [facility] at [phone].  ; left MyChart message for patient.    Integrated Behavioral Health via Telemedicine Video Visit  09/11/2019 Julie Wilkinson [id number]  Rae Lips

## 2019-09-12 ENCOUNTER — Ambulatory Visit: Payer: Medicaid Other | Admitting: Clinical

## 2019-09-12 DIAGNOSIS — Z91199 Patient's noncompliance with other medical treatment and regimen due to unspecified reason: Secondary | ICD-10-CM

## 2019-09-12 DIAGNOSIS — Z5329 Procedure and treatment not carried out because of patient's decision for other reasons: Secondary | ICD-10-CM

## 2019-10-10 ENCOUNTER — Telehealth: Payer: Medicaid Other | Admitting: Nurse Practitioner

## 2019-10-10 DIAGNOSIS — Z5329 Procedure and treatment not carried out because of patient's decision for other reasons: Secondary | ICD-10-CM

## 2019-10-10 DIAGNOSIS — Z91199 Patient's noncompliance with other medical treatment and regimen due to unspecified reason: Secondary | ICD-10-CM

## 2019-10-10 NOTE — Progress Notes (Signed)
4:02p- Called pt for My Chart visit, no answer, left VM that will retry in 10 to 15 min.    4:17p- 2nd call, no answer, patient will need to be re-scheduled.

## 2019-10-11 ENCOUNTER — Ambulatory Visit: Payer: Medicaid Other

## 2019-10-27 ENCOUNTER — Other Ambulatory Visit: Payer: Self-pay | Admitting: Medical

## 2019-10-27 ENCOUNTER — Encounter: Payer: Self-pay | Admitting: Student

## 2019-10-27 DIAGNOSIS — Z3493 Encounter for supervision of normal pregnancy, unspecified, third trimester: Secondary | ICD-10-CM

## 2019-10-30 ENCOUNTER — Other Ambulatory Visit: Payer: Self-pay | Admitting: *Deleted

## 2019-10-30 DIAGNOSIS — Z3493 Encounter for supervision of normal pregnancy, unspecified, third trimester: Secondary | ICD-10-CM

## 2019-10-30 MED ORDER — ONDANSETRON HCL 8 MG PO TABS: 8.0000 mg | ORAL_TABLET | Freq: Three times a day (TID) | ORAL | 0 refills | Status: DC | PRN

## 2019-10-30 NOTE — Progress Notes (Signed)
Pt with current UTI and taking Cephalexin x10 days. She requested refill of Zofran due to nausea w/antibiotics. Refill approved by Judeth Horn, NP

## 2019-11-30 ENCOUNTER — Ambulatory Visit: Payer: Medicaid Other | Admitting: Obstetrics and Gynecology

## 2020-08-19 ENCOUNTER — Encounter: Payer: Self-pay | Admitting: *Deleted

## 2020-08-26 ENCOUNTER — Inpatient Hospital Stay (HOSPITAL_COMMUNITY): Payer: Medicaid Other

## 2020-08-26 ENCOUNTER — Other Ambulatory Visit: Payer: Self-pay

## 2020-08-26 ENCOUNTER — Inpatient Hospital Stay (HOSPITAL_COMMUNITY)
Admission: AD | Admit: 2020-08-26 | Discharge: 2020-08-26 | Disposition: A | Discharge status: Home / Self Care | Payer: Medicaid Other | Attending: Family Medicine | Admitting: Family Medicine

## 2020-08-26 ENCOUNTER — Encounter (HOSPITAL_COMMUNITY): Payer: Self-pay | Admitting: Family Medicine

## 2020-08-26 ENCOUNTER — Telehealth: Payer: Self-pay | Admitting: *Deleted

## 2020-08-26 DIAGNOSIS — O208 Other hemorrhage in early pregnancy: Secondary | ICD-10-CM | POA: Diagnosis not present

## 2020-08-26 DIAGNOSIS — Z3A09 9 weeks gestation of pregnancy: Secondary | ICD-10-CM | POA: Diagnosis not present

## 2020-08-26 DIAGNOSIS — O209 Hemorrhage in early pregnancy, unspecified: Secondary | ICD-10-CM | POA: Diagnosis present

## 2020-08-26 DIAGNOSIS — O200 Threatened abortion: Secondary | ICD-10-CM | POA: Diagnosis not present

## 2020-08-26 DIAGNOSIS — N939 Abnormal uterine and vaginal bleeding, unspecified: Secondary | ICD-10-CM

## 2020-08-26 DIAGNOSIS — Z3A10 10 weeks gestation of pregnancy: Secondary | ICD-10-CM

## 2020-08-26 DIAGNOSIS — Z87891 Personal history of nicotine dependence: Secondary | ICD-10-CM | POA: Diagnosis not present

## 2020-08-26 LAB — URINALYSIS, ROUTINE W REFLEX MICROSCOPIC
Bacteria, UA: NONE SEEN
Bilirubin Urine: NEGATIVE
Glucose, UA: NEGATIVE mg/dL
Ketones, ur: NEGATIVE mg/dL
Nitrite: NEGATIVE
Protein, ur: NEGATIVE mg/dL
Specific Gravity, Urine: 1.012 (ref 1.005–1.030)
pH: 6 (ref 5.0–8.0)

## 2020-08-26 LAB — CBC
HCT: 39.1 % (ref 36.0–46.0)
Hemoglobin: 12.4 g/dL (ref 12.0–15.0)
MCH: 29.5 pg (ref 26.0–34.0)
MCHC: 31.7 g/dL (ref 30.0–36.0)
MCV: 93.1 fL (ref 80.0–100.0)
Platelets: 251 10*3/uL (ref 150–400)
RBC: 4.2 MIL/uL (ref 3.87–5.11)
RDW: 13 % (ref 11.5–15.5)
WBC: 11.4 10*3/uL — ABNORMAL HIGH (ref 4.0–10.5)
nRBC: 0 % (ref 0.0–0.2)

## 2020-08-26 LAB — HCG, QUANTITATIVE, PREGNANCY: hCG, Beta Chain, Quant, S: 83288 m[IU]/mL — ABNORMAL HIGH (ref <5)

## 2020-08-26 NOTE — Discharge Instructions (Signed)
Threatened Miscarriage A threatened miscarriage is when a woman bleeds in her vagina during the first 20 weeks of pregnancy but the pregnancy has not ended. The doctor will do tests to make sure you are still pregnant. This condition does not mean your pregnancy will end, but it does increase the risk that it will end (miscarriage). What are the causes? Normally, the cause of this condition is not known. What increases the risk? These things may make a pregnant woman more likely to lose a pregnancy: Certain health problems  Conditions that affect hormones, such as thyroid disease or polycystic ovary syndrome.  Diabetes.  Disorders that cause the body's disease-fighting system to attack itself by mistake.  Infections.  Bleeding problems.  Being very overweight. Lifestyle factors  Using products that have tobacco or nicotine in them.  Being around tobacco smoke.  Having a lot of caffeine.  Using drugs. Problems with reproductive organs or parts  Having a cervix that opens and thins before you are ready to give birth. The cervix is the lowest part of the womb.  Having Asherman syndrome, which leads to: ? Scars in the womb. ? The womb being an abnormal shape.  Growths (fibroids) in the womb.  Problems in the body that are present at birth.  Infection of the cervix or womb. Personal or health history  Injury.  Having lost an unborn baby before.  Being younger than age 18 or older than age 35.  Being around a harmful substance, such as radiation.  Having lead or other heavy metals in: ? Things you eat or drink. ? The air around you.  Using certain medicines. What are the signs or symptoms?  Bleeding from the vagina. You may also have cramps or pain.  Mild pain or cramps in your belly. How is this diagnosed?  The doctor will do tests, such as an ultrasound to make sure you are still pregnant.   How is this treated? There are no treatments that prevent loss of  pregnancy. But you need to do the right things to take care of yourself at home. Follow these instructions at home:  Get a lot of rest.  Do not have sex or douche if there is bleeding in the vagina.  Do not put things such as tampons in the vagina if it is bleeding.  Do not smoke or use drugs.  Do not drink alcohol.  Avoid caffeine.  Keep all follow-up visits while you are pregnant. Contact a doctor if:  You are pregnant and you have one of these: ? Light bleeding coming from your vagina. ? Spots of blood coming from your vagina.  You have belly pain or cramping.  You have a fever. Get help right away if:  Blood soaks through 2 large pads an hour for more than 2 hours.  Clots of blood come from your vagina.  Tissue comes out of your vagina.  Fluid leaks or gushes from your vagina.  You have very bad pain in your low back.  You have very bad cramps in your belly.  You have a fever, chills, and very bad belly pain. Summary  A threatened miscarriage is when a woman bleeds in her vagina during the first 20 weeks of pregnancy but the pregnancy has not ended.  Normally, the cause of this condition is not known.  Symptoms include bleeding in the vagina or mild pain or cramps in your belly.  There are no treatments that prevent loss of pregnancy.  Keep   all follow-up visits while you are pregnant. This information is not intended to replace advice given to you by your health care provider. Make sure you discuss any questions you have with your health care provider. Document Revised: 01/26/2020 Document Reviewed: 01/26/2020 Elsevier Patient Education  2021 Elsevier Inc.  

## 2020-08-26 NOTE — MAU Provider Note (Signed)
History     CSN: [id number]  Arrival date and time: 08/26/20 1446   Event Date/Time   First Provider Initiated Contact with Patient 08/26/20 1746      Chief Complaint  Patient presents with  . Vaginal Bleeding   HPI This is a 25yo [id number] at 10 w1d by LMP. Seen for vaginal bleeding that started this morning at 2am. She had some watery bleeding and passed a couple of quarter sized blood clots. No further bleeding until she got into the MAU room - having a little bit of dark bleeding. Mild cramping. No fevers, chills, nausea, vomiting.  OB History    Gravida  5   Para  2   Term  2   Preterm      AB  2   Living  2     SAB  1   IAB  1   Ectopic      Multiple      Live Births  2           Past Medical History:  Diagnosis Date  . Anxiety 2017    Past Surgical History:  Procedure Laterality Date  . HYMENECTOMY  25 years old    Family History  Problem Relation Age of Onset  . Bipolar disorder Mother   . Depression Father   . Bipolar disorder Sister     Social History   Tobacco Use  . Smoking status: Former Smoker    Types: Cigarettes    Quit date: 2015    Years since quitting: 7.0  . Smokeless tobacco: Never Used  Vaping Use  . Vaping Use: Never used  Substance Use Topics  . Alcohol use: Not Currently  . Drug use: No    Allergies:  Allergies  Allergen Reactions  . Loracarbef Hives    Medications Prior to Admission  Medication Sig Dispense Refill Last Dose  . acetaminophen (TYLENOL) 325 MG tablet Take 2 tablets (650 mg total) by mouth every 4 (four) hours as needed (for pain scale < 4). 30 tablet 0 Past Week at Unknown time  . ibuprofen (ADVIL) 600 MG tablet Take 1 tablet (600 mg total) by mouth every 6 (six) hours. 30 tablet 0 08/26/2020 at 0800  . Prenatal Vit-Fe Fumarate-FA (PRENATAL MULTIVITAMIN) TABS tablet Take 1 tablet by mouth daily at 12 noon.   08/25/2020 at 0800  . AMBULATORY NON FORMULARY MEDICATION 1 Device by Other route  once a week. Blood pressure cuff/ regular  Monitored regularly at home ICD 10: Z34.90 1 kit 0   . ondansetron (ZOFRAN) 8 MG tablet Take 1 tablet (8 mg total) by mouth every 8 (eight) hours as needed for nausea or vomiting. 15 tablet 0     Review of Systems Physical Exam   Blood pressure 127/63, pulse 86, temperature 98.1 F (36.7 C), temperature source Oral, resp. rate 20, height 5' 4"  (1.626 m), weight 67.6 kg, last menstrual period 06/16/2020, SpO2 100 %, unknown if currently breastfeeding.  Physical Exam Vitals and nursing note reviewed.  Constitutional:      Appearance: Normal appearance.  Cardiovascular:     Rate and Rhythm: Normal rate.     Pulses: Normal pulses.  Pulmonary:     Effort: Pulmonary effort is normal.  Abdominal:     General: Abdomen is flat. There is no distension.     Palpations: Abdomen is soft. There is no mass.     Tenderness: There is abdominal tenderness (suprapubic tenderness). There is no guarding  or rebound.     Hernia: No hernia is present.  Neurological:     Mental Status: She is alert.  Psychiatric:        Mood and Affect: Mood normal.        Behavior: Behavior normal.        Thought Content: Thought content normal.        Judgment: Judgment normal.    Results for orders placed or performed during the hospital encounter of 08/26/20 (from the past 24 hour(s))  Urinalysis, Routine w reflex microscopic Urine, Clean Catch     Status: Abnormal   Collection Time: 08/26/20  4:57 PM  Result Value Ref Range   Color, Urine YELLOW YELLOW   APPearance CLEAR CLEAR   Specific Gravity, Urine 1.012 1.005 - 1.030   pH 6.0 5.0 - 8.0   Glucose, UA NEGATIVE NEGATIVE mg/dL   Hgb urine dipstick MODERATE (A) NEGATIVE   Bilirubin Urine NEGATIVE NEGATIVE   Ketones, ur NEGATIVE NEGATIVE mg/dL   Protein, ur NEGATIVE NEGATIVE mg/dL   Nitrite NEGATIVE NEGATIVE   Leukocytes,Ua SMALL (A) NEGATIVE   RBC / HPF 0-5 0 - 5 RBC/hpf   WBC, UA 11-20 0 - 5 WBC/hpf    Bacteria, UA NONE SEEN NONE SEEN   Squamous Epithelial / LPF 0-5 0 - 5   Mucus PRESENT   hCG, quantitative, pregnancy     Status: Abnormal   Collection Time: 08/26/20  5:31 PM  Result Value Ref Range   hCG, Beta Chain, Quant, S 83,288 (H) <5 mIU/mL  CBC     Status: Abnormal   Collection Time: 08/26/20  5:31 PM  Result Value Ref Range   WBC 11.4 (H) 4.0 - 10.5 K/uL   RBC 4.20 3.87 - 5.11 MIL/uL   Hemoglobin 12.4 12.0 - 15.0 g/dL   HCT 39.1 36.0 - 46.0 %   MCV 93.1 80.0 - 100.0 fL   MCH 29.5 26.0 - 34.0 pg   MCHC 31.7 30.0 - 36.0 g/dL   RDW 13.0 11.5 - 15.5 %   Platelets 251 150 - 400 K/uL   nRBC 0.0 0.0 - 0.2 %   US OB Comp Less 14 Wks  Result Date: 08/26/2020 CLINICAL DATA:  Vaginal bleeding. EXAM: OBSTETRIC <14 WK ULTRASOUND TECHNIQUE: Transabdominal ultrasound was performed for evaluation of the gestation as well as the maternal uterus and adnexal regions. COMPARISON:  July 19, 2019 FINDINGS: Intrauterine gestational sac: Single Yolk sac:  Visualized. Embryo:  Visualized. Cardiac Activity: Visualized. Heart Rate: 176 bpm CRL:   24.4 mm   9 w 1 d                  [place] EDC: March 23, 2021 Subchorionic hemorrhage:  None visualized. Maternal uterus/adnexae: The bilateral ovaries are visualized and are normal in appearance. No pelvic free fluid is seen. IMPRESSION: Single, viable intrauterine pregnancy at approximately 9 weeks and 1 day gestation by ultrasound evaluation. Electronically Signed   By: Virgina Norfolk M.D.   On: 08/26/2020 18:28     MAU Course  Procedures  MDM  Assessment and Plan     ICD-10-CM   1. [redacted] weeks gestation of pregnancy  Z3A.10   2. Vaginal bleeding  N93.9 US OB Comp Less 14 Wks    US OB Comp Less 14 Wks    CANCELED: US OB LESS THAN 14 WEEKS WITH OB TRANSVAGINAL    CANCELED: US OB LESS THAN 14 WEEKS WITH OB TRANSVAGINAL  3. Threatened abortion  affecting intrauterine pregnancy  O20.0    Patient stable. Discharge to home with pelvic rest. Follow up  with [facility].  Truett Mainland 08/26/2020, 5:45 PM

## 2020-08-26 NOTE — Telephone Encounter (Signed)
Left patient a message to arrive at 10:00 AM on 08/27/2020 due to the office not opening until 10:00 AM, if possible due to road conditions. Call the office if appointment needs to be rescheduled.

## 2020-08-26 NOTE — MAU Note (Signed)
Pt stated having vag bleeding around 2am this morning. Had some cramping a tjat time. Reports passing 2 med sized clots and then is still bleeding but bleeding less this afternoon more like spotting. No pain at present.

## 2020-08-27 ENCOUNTER — Other Ambulatory Visit (HOSPITAL_COMMUNITY)
Admission: RE | Admit: 2020-08-27 | Discharge: 2020-08-27 | Disposition: A | Discharge status: Home / Self Care | Payer: Medicaid Other | Source: Ambulatory Visit | Attending: Advanced Practice Midwife | Admitting: Advanced Practice Midwife

## 2020-08-27 ENCOUNTER — Encounter: Payer: Self-pay | Admitting: Advanced Practice Midwife

## 2020-08-27 ENCOUNTER — Ambulatory Visit (INDEPENDENT_AMBULATORY_CARE_PROVIDER_SITE_OTHER): Payer: Medicaid Other | Admitting: Advanced Practice Midwife

## 2020-08-27 VITALS — BP 110/69 | HR 97 | Wt 142.0 lb

## 2020-08-27 DIAGNOSIS — Z3A09 9 weeks gestation of pregnancy: Secondary | ICD-10-CM | POA: Diagnosis not present

## 2020-08-27 DIAGNOSIS — Z3480 Encounter for supervision of other normal pregnancy, unspecified trimester: Secondary | ICD-10-CM | POA: Insufficient documentation

## 2020-08-27 DIAGNOSIS — O209 Hemorrhage in early pregnancy, unspecified: Secondary | ICD-10-CM | POA: Diagnosis not present

## 2020-08-27 NOTE — Progress Notes (Signed)
Subjective:   Julie Wilkinson is a 25 y.o. [id number] at 57w2dby early ultrasound being seen today for her first obstetrical visit.  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 Patient does intend to breast feed. Pregnancy history fully reviewed.  Patient reports nausea.  HISTORY: OB History  Gravida Para Term Preterm AB Living  5 2 2  0 2 2  SAB IAB Ectopic Multiple Live Births  1 1 0 0 2    # Outcome Date GA Lbr Len/2nd Weight Sex Delivery Anes PTL Lv  5 Current           4 Term 09/04/19 437w0d F Vag-Spont   LIV  3 IAB 10/06/18          2 Term 08/29/14 4080w0d lb 8 oz (3.402 kg) M Vag-Spont EPI N LIV  1 SAB 2015           Past Medical History:  Diagnosis Date  . Anxiety 2017   Past Surgical History:  Procedure Laterality Date  . HYMENECTOMY  [age]ars old   Family History  Problem Relation Age of Onset  . Bipolar disorder Mother   . Depression Father   . Bipolar disorder Sister    Social History   Tobacco Use  . Smoking status: Former Smoker    Types: Cigarettes    Quit date: 2015    Years since quitting: 7.0  . Smokeless tobacco: Never Used  Vaping Use  . Vaping Use: Never used  Substance Use Topics  . Alcohol use: Not Currently  . Drug use: No   Allergies  Allergen Reactions  . Loracarbef Hives   Current Outpatient Medications on File Prior to Visit  Medication Sig Dispense Refill  . acetaminophen (TYLENOL) 325 MG tablet Take 2 tablets (650 mg total) by mouth every 4 (four) hours as needed (for pain scale < 4). (Patient not taking: Reported on 08/27/2020) 30 tablet 0  . AMBULATORY NON FORMULARY MEDICATION 1 Device by Other route once a week. Blood pressure cuff/ regular  Monitored regularly at home ICD 10: Z34.90 (Patient not taking:  Reported on 08/27/2020) 1 kit 0  . Prenatal Vit-Fe Fumarate-FA (PRENATAL MULTIVITAMIN) TABS tablet Take 1 tablet by mouth daily at 12 noon. (Patient not taking: Reported on 08/27/2020)     No current facility-administered medications on file prior to visit.     Indications for ASA therapy (per uptodate) One of the following: Previous pregnancy with preeclampsia, especially early onset and with an adverse outcome No Multifetal gestation No Chronic hypertension No Type 1 or 2 diabetes mellitus No Chronic kidney disease No Autoimmune disease (antiphospholipid syndrome, systemic lupus erythematosus) No   Two or more of the following: Nulliparity No Obesity (body mass index >30 kg/m2) No Family history of preeclampsia in mother or sister No Age ?35 years No Sociodemographic characteristics (African American race, low socioeconomic level) No Personal risk factors (eg, previous pregnancy with low birth weight or small for gestational age infant, previous adverse pregnancy outcome [eg, stillbirth], interval >10 years between pregnancies) No   Indications for early 1 hour GTT (per uptodate)  BMI >25 (>23 in Asian women) AND one of the following  No, BMI 23  Exam   Vitals:   08/27/20 1036  BP: 110/69  Pulse:  97  Weight: 142 lb (64.4 kg)   Fetal Heart Rate (bpm): 175  Uterus:     Pelvic Exam: Perineum: no hemorrhoids, normal perineum   Vulva: normal external genitalia, no lesions   Vagina:  normal mucosa, normal discharge   Cervix: no lesions and normal, pap smear done.    Adnexa: normal adnexa and no mass, fullness, tenderness   Bony Pelvis: average  System: General: well-developed, well-nourished female in no acute distress   Breast:  normal appearance, no masses or tenderness   Skin: normal coloration and turgor, no rashes   Neurologic: oriented, normal, negative, normal mood   Extremities: normal strength, tone, and muscle mass, ROM of all joints is normal   HEENT PERRLA,  extraocular movement intact and sclera clear, anicteric   Mouth/Teeth mucous membranes moist, pharynx normal without lesions and dental hygiene good   Neck supple and no masses   Cardiovascular: regular rate and rhythm   Respiratory:  no respiratory distress, normal breath sounds   Abdomen: soft, non-tender; bowel sounds normal; no masses,  no organomegaly     Assessment:   Pregnancy: [id number] Patient Active Problem List   Diagnosis Date Noted  . Supervision of other normal pregnancy, antepartum 08/27/2020  . Bipolar 1 disorder ([place]) 06/07/2019  . 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 PTSD (post-traumatic stress disorder) 05/17/2017     Plan:  1. Supervision of other normal pregnancy, antepartum --Anticipatory guidance about next visits/weeks of pregnancy given. --Next visit in 4 weeks, lab only visit in 3 weeks for NIPS testing as desired [place] only, carrier screening done with previous pregnancy. --Mild nausea, managed well by diet changes, pt to notify office if worsens or needs medical management. - Culture, OB Urine - Urine cytology ancillary only([place])   2. Vaginal bleeding in pregnancy, first trimester --Was seen in MAU 08/27/19 for bleeding with quarter sized clots.  Today bleeding is scant, pink mixed with mucus only.  --Bedside US in office reveals viable pregnancy, dates c/w US done yesterday. --Bleeding precautions given   Initial labs drawn. Continue prenatal vitamins. Discussed and offered genetic screening options, including Quad screen/AFP, NIPS testing, and option to decline testing. Benefits/risks/alternatives reviewed. Pt aware that anatomy US is form of genetic screening with lower accuracy in detecting trisomies than blood work.  Pt chooses genetic screening today. NIPS: requested. Ultrasound discussed; fetal anatomic survey: requested. Problem list reviewed and updated. The nature of [place] with multiple MDs and other Advanced Practice Providers was explained to patient; also emphasized that residents, students are part of our team. Routine obstetric precautions reviewed. No follow-ups on file.   Fatima Blank, CNM 08/27/20 12:11 PM

## 2020-08-27 NOTE — Progress Notes (Signed)
Bedside U/S shows single IUP with FHT of 175 BPM and CRL measures 26.65mm  GA [redacted]w[redacted]d

## 2020-08-28 ENCOUNTER — Telehealth: Payer: Self-pay | Admitting: Advanced Practice Midwife

## 2020-08-28 ENCOUNTER — Encounter (HOSPITAL_COMMUNITY): Payer: Self-pay | Admitting: Obstetrics and Gynecology

## 2020-08-28 ENCOUNTER — Telehealth: Payer: Self-pay | Admitting: *Deleted

## 2020-08-28 ENCOUNTER — Other Ambulatory Visit: Payer: Self-pay

## 2020-08-28 ENCOUNTER — Inpatient Hospital Stay (HOSPITAL_COMMUNITY)
Admission: AD | Admit: 2020-08-28 | Discharge: 2020-08-28 | Disposition: A | Discharge status: Home / Self Care | Payer: Medicaid Other | Attending: Obstetrics and Gynecology | Admitting: Obstetrics and Gynecology

## 2020-08-28 ENCOUNTER — Inpatient Hospital Stay (HOSPITAL_COMMUNITY): Payer: Medicaid Other

## 2020-08-28 DIAGNOSIS — O039 Complete or unspecified spontaneous abortion without complication: Secondary | ICD-10-CM | POA: Diagnosis present

## 2020-08-28 DIAGNOSIS — Z3A00 Weeks of gestation of pregnancy not specified: Secondary | ICD-10-CM | POA: Diagnosis not present

## 2020-08-28 DIAGNOSIS — Z87891 Personal history of nicotine dependence: Secondary | ICD-10-CM | POA: Insufficient documentation

## 2020-08-28 DIAGNOSIS — R109 Unspecified abdominal pain: Secondary | ICD-10-CM | POA: Diagnosis present

## 2020-08-28 MED ORDER — MISOPROSTOL 200 MCG PO TABS: ORAL_TABLET | ORAL | 1 refills | Status: DC

## 2020-08-28 MED ORDER — OXYCODONE-ACETAMINOPHEN 5-325 MG PO TABS: 1.0000 | ORAL_TABLET | Freq: Four times a day (QID) | ORAL | 0 refills | Status: DC | PRN

## 2020-08-28 MED ORDER — PROMETHAZINE HCL 25 MG PO TABS: 25.0000 mg | ORAL_TABLET | Freq: Four times a day (QID) | ORAL | 2 refills | Status: DC | PRN

## 2020-08-28 NOTE — MAU Provider Note (Addendum)
Chief Complaint:  Vaginal Bleeding   Event Date/Time   First Provider Initiated Contact with Patient 08/28/20 0344       HPI: Julie Wilkinson is a 25 y.o. [id number] who presents to maternity admissions reporting severe cramping and bleeding which resulted in the passage of the entire fetus and gestational sac.  Had just been seen on 1/17 and 1/18 for bleeding, with live fetus seen on [place]. Had recently taken Cytotec pills for an EAB but did not take remaining pills. . She reports vaginal bleeding, no urinary symptoms, h/a, dizziness, n/v, or fever/chills.    Vaginal Bleeding The patient's primary symptoms include pelvic pain and vaginal bleeding. The patient's pertinent negatives include no genital itching, genital lesions or genital odor. This is a recurrent problem. The current episode started today. The problem occurs intermittently. The problem has been gradually improving. Associated symptoms include abdominal pain. Pertinent negatives include no chills, constipation, diarrhea, fever or nausea. The vaginal discharge was bloody. She has been passing clots. She has been passing tissue.   RN Note: Took first abortion pills on 08/25/19 and had some bleeding. Decided not to take remaining pills. Was seen here Monday and u/s was good and showed heart beat. Tonight had a lot of cramping and bleeding. Cont to cramp some and bleeding but not as heavily. Pt brought in fetus in paper towel in a box which they gave to RN before going back to lobby to wait for a room  Past Medical History: Past Medical History:  Diagnosis Date  . Anxiety 2017    Past obstetric history: OB History  Gravida Para Term Preterm AB Living  5 2 2   2 2   SAB IAB Ectopic Multiple Live Births  1 1     2     # Outcome Date GA Lbr Len/2nd Weight Sex Delivery Anes PTL Lv  5 Current           4 Term 09/04/19 [redacted]w[redacted]d  F Vag-Spont   LIV  3 IAB 10/06/18          2 Term 08/29/14 [id number] g M Vag-Spont EPI N LIV  1 SAB 2015             Past Surgical History: Past Surgical History:  Procedure Laterality Date  . HYMENECTOMY  [age]ears old    Family History: Family History  Problem Relation Age of Onset  . Bipolar disorder Mother   . Depression Father   . Bipolar disorder Sister     Social History: Social History   Tobacco Use  . Smoking status: Former Smoker    Types: Cigarettes    Quit date: 2015    Years since quitting: 7.0  . Smokeless tobacco: Never Used  Vaping Use  . Vaping Use: Never used  Substance Use Topics  . Alcohol use: Not Currently  . Drug use: No    Allergies:  Allergies  Allergen Reactions  . Loracarbef Hives    Meds:  Medications Prior to Admission  Medication Sig Dispense Refill Last Dose  . acetaminophen (TYLENOL) 325 MG tablet Take 2 tablets (650 mg total) by mouth every 4 (four) hours as needed (for pain scale < 4). (Patient not taking: Reported on 08/27/2020) 30 tablet 0   . AMBULATORY NON FORMULARY MEDICATION 1 Device by Other route once a week. Blood pressure cuff/ regular  Monitored regularly at home ICD 10: Z34.90 (Patient not taking: Reported on 08/27/2020) 1 kit 0   . Prenatal  Vit-Fe Fumarate-FA (PRENATAL MULTIVITAMIN) TABS tablet Take 1 tablet by mouth daily at 12 noon. (Patient not taking: Reported on 08/27/2020)       I have reviewed patient's Past Medical Hx, Surgical Hx, Family Hx, Social Hx, medications and allergies.  ROS:  Review of Systems  Constitutional: Negative for chills and fever.  Gastrointestinal: Positive for abdominal pain. Negative for constipation, diarrhea and nausea.  Genitourinary: Positive for pelvic pain and vaginal bleeding.   Other systems negative     Physical Exam   Patient Vitals for the past 24 hrs:  BP Temp Pulse Resp Height Weight  08/28/20 0125 110/62 -- (!) 108 -- -- --  08/28/20 0123 -- 98.5 F (36.9 C) -- 16 5' 4"  (1.626 m) 63.5 kg   Constitutional: Well-developed, well-nourished female in no acute distress.   Cardiovascular: normal rate and rhythm Respiratory: normal effort, no distress.  GI: Abd soft, non-tender.  Nondistended.  No rebound, No guarding.   MS: Extremities nontender, no edema, normal ROM Neurologic: Alert and oriented x 4.   Grossly nonfocal. GU: Neg CVAT. Skin:  Warm and Dry Psych:  Affect appropriate.  PELVIC EXAM: Cervix open 1-2cm.  Some bloody tissue at os, unable to remove. No active hemorrhage   Labs: No results found for this or any previous visit (from the past 24 hour(s)).   Ref. Range 08/26/2020 17:31  Hemoglobin Latest Ref Range: 12.0 - 15.0 g/dL 12.4   --/--/O POS (01/24 2247)  Imaging:  US OB Transvaginal  Result Date: 08/28/2020 CLINICAL DATA:  Spontaneous abortion. EXAM: OBSTETRIC <14 WK [place] AND TRANSVAGINAL OB US TECHNIQUE: Both transabdominal and transvaginal ultrasound examinations were performed for complete evaluation of the gestation as well as the maternal uterus, adnexal regions, and pelvic cul-de-sac. Transvaginal technique was performed to assess early pregnancy. COMPARISON:  August 26, 2020 FINDINGS: Intrauterine gestational sac: None Subchorionic hemorrhage:  None visualized. Maternal uterus/adnexae: The endometrium is thickened measuring approximately 3 cm in thickness. Echogenic material is noted within the endometrial canal. The ovaries are unremarkable. There is a trace amount of free fluid the patient's pelvis. IMPRESSION: 1. No IUP identified. 2. Thickened endometrium with echogenic material within the endometrial canal may represent retained products of conception. 3. Trace pelvic free fluid. Electronically Signed   By: Constance Holster M.D.   On: 08/28/2020 03:17     MAU Course/MDM: I have ordered labs as follows: none Imaging ordered: [place] which showed some echogenic material  Fetus inside intact gestational sac was sent to pathology.   Consult Dr Elly Modena who recommends Cytotec to evacuate rest of lining. .   Offered dosing here or Rx  so that she can take it at home.  She chooses Rx.  Pt stable at time of discharge.  Assessment: 1. SAB (spontaneous abortion)   2.   Small amount of retained tissue  Plan: Discharge home Recommend bleeding precautions Rx sent for Cytotec, Phenergan, Percocet for full evacuation of uterine lining.  Precautions to return for reviewed  Encouraged to return here or to other Urgent Care/ED if she develops worsening of symptoms, increase in pain, fever, or other concerning symptoms.   Hansel Feinstein CNM, MSN Certified Nurse-Midwife 08/28/2020 3:44 AM

## 2020-08-28 NOTE — Discharge Instructions (Signed)
Miscarriage A miscarriage is the loss of pregnancy before the 20th week. Most miscarriages happen during the first 3 months of pregnancy. Sometimes, a miscarriage can happen before a woman knows that she is pregnant. Having a miscarriage can be an emotional experience. If you have had a miscarriage, talk with your health care provider about any questions you may have about the loss of your baby, the grieving process, and your plans for future pregnancy. What are the causes? Many times, the cause of a miscarriage is not known. What increases the risk? The following factors may make a pregnant woman more likely to have a miscarriage: Certain medical conditions  Conditions that affect the hormone balance in the body, such as thyroid disease or polycystic ovary syndrome.  Diabetes.  Autoimmune disorders.  Infections.  Bleeding disorders.  Obesity. Lifestyle factors  Using products with tobacco or nicotine in them or being exposed to tobacco smoke.  Having alcohol.  Having large amounts of caffeine.  Recreational drug use. Problems with reproductive organs or structures  Cervical insufficiency. This is when the lowest part of the uterus (cervix) opens and thins before pregnancy is at term.  Having a condition called Asherman syndrome. This syndrome causes scarring in the uterus or causes the uterus to be abnormal in structure.  Fibrous growths, called fibroids, in the uterus.  Congenital abnormalities. These problems are present at birth.  Infection of the cervix or uterus. Personal or medical history  Injury (trauma).  Having had a miscarriage before.  Being younger than age 18 or older than age 35.  Exposure to harmful substances in the environment. This may include radiation or heavy metals, such as lead.  Use of certain medicines. What are the signs or symptoms? Symptoms of this condition include:  Vaginal bleeding or spotting, with or without cramps or  pain.  Pain or cramping in the abdomen or lower back.  Fluid or tissue coming out of the vagina. How is this diagnosed? This condition may be diagnosed based on:  A physical exam.  Ultrasound.  Lab tests, such as blood tests, urine tests, or swabs for infection. How is this treated? Treatment for a miscarriage is sometimes not needed if all the pregnancy tissue that was in the uterus comes out on its own, and there are no other problems such as infection or heavy bleeding. In other cases, this condition may be treated with:  Dilation and curettage (D&C). In this procedure, the cervix is stretched open and any remaining pregnancy tissue is removed from the lining of the uterus (endometrium).  Medicines. These may include: ? Antibiotic medicine, to treat infection. ? Medicine to help any remaining pregnancy tissue come out of the body. ? Medicine to reduce (contract) the size of the uterus. These medicines may be given if there is a lot of bleeding. If you have Rh-negative blood, you may be given an injection of a medicine called Rho(D) immune globulin. This medicine helps prevent problems with future pregnancies. Follow these instructions at home: Medicines  Take over-the-counter and prescription medicines only as told by your health care provider.  If you were prescribed antibiotic medicine, take it as told by your health care provider. Do not stop taking the antibiotic even if you start to feel better. Activity  Rest as told by your health care provider. Ask your health care provider what activities are safe for you.  Have someone help with home and family responsibilities during this time. General instructions  Monitor how much tissue   or blood clot material comes out of the vagina.  Do not have sex, douche, or put anything, such as tampons, in your vagina until your health care provider says it is okay.  To help you and your partner with the grieving process, talk with your  health care provider or get counseling.  When you are ready, meet with your health care provider to discuss any important steps you should take for your health. Also, discuss steps you should take to have a healthy pregnancy in the future.  Keep all follow-up visits. This is important.   Where to find more information  The American College of Obstetricians and Gynecologists: acog.org  [place] Department of Health and Human Services Office of Women's Health: hrsa.gov/office-womens-health Contact a health care provider if:  You have a fever or chills.  There is bad-smelling fluid coming from the vagina.  You have more bleeding instead of less.  Tissue or blood clots come out of your vagina. Get help right away if:  You have severe cramps or pain in your back or abdomen.  Heavy bleeding soaks through 2 large sanitary pads an hour for more than 2 hours.  You become light-headed or weak.  You faint.  You feel sad, and your sadness takes over your thoughts.  You think about hurting yourself. If you ever feel like you may hurt yourself or others, or have thoughts about taking your own life, get help right away. Go to your nearest emergency department or:  Call your local emergency services (911 in the [place]  Call a suicide crisis helpline, such as the National Suicide Prevention Lifeline at 1-800-273-8255. This is open 24 hours a day in the [place]  Text the Crisis Text Line at 741741 (in the [place] Summary  Most miscarriages happen in the first 3 months of pregnancy. Sometimes miscarriage happens before a woman knows that she is pregnant.  Follow instructions from your health care provider about medicines and activity.  To help you and your partner with grieving, talk with your health care provider or get counseling.  Keep all follow-up visits. This information is not intended to replace advice given to you by your health care provider. Make sure you discuss any questions you  have with your health care provider. Document Revised: 01/26/2020 Document Reviewed: 01/26/2020 Elsevier Patient Education  2021 Elsevier Inc.     Managing Pregnancy Loss Pregnancy loss can happen any time during a pregnancy. Often the cause is not known. It is rarely because of anything you did. Pregnancy loss in early pregnancy (during the first trimester) is called a miscarriage. This type of pregnancy loss is the most common. Pregnancy loss that happens after 20 weeks of pregnancy is called fetal demise if the baby's heart stops beating before birth. Fetal demise is much less common. Some women experience spontaneous labor shortly after fetal demise resulting in a stillborn birth (stillbirth). Any pregnancy loss can be devastating. You will need to recover both physically and emotionally. Most women are able to get pregnant again after a pregnancy loss and deliver a healthy baby. How to manage emotional recovery Pregnancy loss is very hard emotionally. You may feel many different emotions while you grieve. You may feel sad and angry. You may also feel guilty. It is normal to have periods of crying. Emotional recovery can take longer than physical recovery. It is different for everyone. Taking these steps can help you in managing this loss:  Remember that it is unlikely you did anything   to cause the pregnancy loss.  Share your thoughts and feelings with friends, family, and your partner. Remember that your partner is also recovering emotionally.  Make sure you have a good support system. Do not spend too much time alone.  Meet with a pregnancy loss counselor or join a pregnancy loss support group.  Get enough sleep and eat a healthy diet. Return to regular exercise when you have recovered physically.  Do not use drugs or alcohol to manage your emotions.  Consider seeing a mental health professional to help you recover emotionally.  Ask a friend or loved one to help you decide what to  do with any clothing and nursery items you received for your baby. In the case of a stillbirth, many women benefit from taking additional steps in the grieving process. You may want to:  Hold your baby after the birth.  Name your baby.  Request a birth certificate.  Create a keepsake such as handprints or footprints.  Dress your baby and have a picture taken.  Make funeral arrangements.  Ask for a baptism or blessing. Hospitals have staff members who can help you with all these arrangements.   How to recognize emotional stress It is normal to have emotional stress after a pregnancy loss. But emotional stress that lasts a long time or becomes severe requires treatment. Watch out for these signs of severe emotional stress:  Sadness, anger, or guilt that is not going away and is interfering with your normal activities.  Relationship problems that have occurred or gotten worse since the pregnancy loss.  Signs of depression that last longer than 2 weeks. These may include: ? Sadness. ? Anxiety. ? Hopelessness. ? Loss of interest in activities you enjoy. ? Inability to concentrate. ? Trouble sleeping or sleeping too much. ? Loss of appetite or overeating. ? Thoughts of death or of hurting yourself. Follow these instructions at home:  Take over-the-counter and prescription medicines only as told by your health care provider.  Rest at home until your energy level returns. Return to your normal activities as told by your health care provider. Ask your health care provider what activities are safe for you.  When you are ready, meet with your health care provider to discuss steps to take for a future pregnancy.  Keep all follow-up visits as told by your health care provider. This is important. Where to find support  To help you and your partner with the process of grieving, talk with your health care provider or seek counseling.  Consider meeting with others who have experienced  pregnancy loss. Ask your health care provider about support groups and resources. Where to find more information  [place]. Department of Health and Human Services Office on Women's Health: www.womenshealth.gov  American Pregnancy Association: www.americanpregnancy.org Contact a health care provider if:  You continue to experience grief, sadness, or lack of motivation for everyday activities, and those feelings do not improve over time.  You are struggling to recover emotionally, especially if you are using alcohol or substances to help. Get help right away if:  You have thoughts of hurting yourself or others. If you ever feel like you may hurt yourself or others, or have thoughts about taking your own life, get help right away. You can go to your nearest emergency department or call:  Your local emergency services (911 in the [place]  A suicide crisis helpline, such as the National Suicide Prevention Lifeline at 1-800-273-8255. This is open 24 hours a day. Summary    Any pregnancy loss can be difficult physically and emotionally.  You may experience many different emotions while you grieve. Emotional recovery can last longer than physical recovery.  It is normal to have emotional stress after a pregnancy loss. But emotional stress that lasts a long time or becomes severe requires treatment.  See your health care provider if you are struggling emotionally after a pregnancy loss. This information is not intended to replace advice given to you by your health care provider. Make sure you discuss any questions you have with your health care provider. Document Revised: 11/16/2018 Document Reviewed: 10/07/2017 Elsevier Patient Education  2021 Elsevier Inc.   

## 2020-08-28 NOTE — MAU Note (Addendum)
Took first abortion pills on 08/25/19 and had some bleeding. Decided not to take remaining pills. Was seen here Monday and u/s was good and showed heart beat. Tonight had a lot of cramping and bleeding. Cont to cramp some and bleeding but not as heavily. Pt brought in fetus in paper towel in a box which they gave to RN before going back to lobby to wait for a room

## 2020-08-28 NOTE — Progress Notes (Signed)
Written and verbal d/c instructions given and understanding voiced. 

## 2020-08-28 NOTE — Telephone Encounter (Signed)
Called patient to check on her after SAB diagnosed early this morning.  She is [id number] at [redacted]w[redacted]d who took pills for termination on 08/25/19 but did not complete course. She had bleeding and presented to MAU on 08/26/20. US showed viable pregnancy. I saw her in the office for new OB visit on 08/27/20 and she again had US showing normal FHR and no visible subchorionic hemorrhage.  Pt presented to MAU at 3 am the following morning and had passed 9 week pregnancy at home.  With possible retained POCs, Cytotec was prescribed.  Pt needs to f/u at [facility] in 1 week for hcg and in 2 weeks for provider visit. She can present to MAU for any emergencies.   Pt mailbox is full so unable to leave message.    MyChart message sent for pt to call [facility] office for follow up.

## 2020-08-28 NOTE — MAU Note (Signed)
Pt and significant other asked if they wanted to see the baby &/or take the baby home with them.  (Baby in intact amniotic sac). They both declined to seeing baby and wants hospital to dispose of the baby.

## 2020-08-28 NOTE — Telephone Encounter (Signed)
Left patient an urgent message to call and schedule ER F/U appointment for 09/02/2020 at 8:15 or 10:00 AM.

## 2020-08-29 ENCOUNTER — Other Ambulatory Visit: Payer: Self-pay

## 2020-08-29 ENCOUNTER — Encounter: Payer: Self-pay | Admitting: Advanced Practice Midwife

## 2020-08-29 DIAGNOSIS — O039 Complete or unspecified spontaneous abortion without complication: Secondary | ICD-10-CM

## 2020-08-29 LAB — URINE CYTOLOGY ANCILLARY ONLY
Chlamydia: NEGATIVE
Comment: NEGATIVE
Comment: NORMAL
Neisseria Gonorrhea: NEGATIVE

## 2020-08-29 LAB — CULTURE, OB URINE

## 2020-08-29 LAB — URINE CULTURE, OB REFLEX

## 2020-08-29 LAB — SURGICAL PATHOLOGY

## 2020-08-29 NOTE — Progress Notes (Signed)
Ref placed for [facility] per Sharen Counter, CNM

## 2020-09-02 ENCOUNTER — Other Ambulatory Visit (INDEPENDENT_AMBULATORY_CARE_PROVIDER_SITE_OTHER): Payer: Medicaid Other

## 2020-09-02 ENCOUNTER — Other Ambulatory Visit: Payer: Self-pay

## 2020-09-02 DIAGNOSIS — O039 Complete or unspecified spontaneous abortion without complication: Secondary | ICD-10-CM

## 2020-09-02 NOTE — Progress Notes (Signed)
Pt her for F/U SAB labwork only

## 2020-09-03 LAB — HCG, QUANTITATIVE, PREGNANCY: HCG, Total, QN: 4101 m[IU]/mL

## 2020-09-12 ENCOUNTER — Other Ambulatory Visit: Payer: Self-pay

## 2020-09-12 ENCOUNTER — Encounter: Payer: Self-pay | Admitting: Obstetrics and Gynecology

## 2020-09-12 ENCOUNTER — Ambulatory Visit (INDEPENDENT_AMBULATORY_CARE_PROVIDER_SITE_OTHER): Payer: Medicaid Other | Admitting: Obstetrics and Gynecology

## 2020-09-12 VITALS — BP 88/54 | HR 62 | Resp 16 | Ht 62.0 in | Wt 142.0 lb

## 2020-09-12 DIAGNOSIS — O039 Complete or unspecified spontaneous abortion without complication: Secondary | ICD-10-CM | POA: Diagnosis not present

## 2020-09-12 NOTE — Progress Notes (Signed)
GYNECOLOGY OFFICE FOLLOW UP NOTE  History:  25 y.o. [id number] here today for follow up for EAB via cytotec. Fetus passed and patient presented to MAU, was told to take another dose of cytotec to evacuate lining. Pt here for follow up. States she passed a large amount of tissue and clot and has had dark brown/red spotting irregularly since. No fever, chills,  Nausea, vomiting. No active or bright red bleeding.     Past Medical History:  Diagnosis Date  . Anxiety 2017    Past Surgical History:  Procedure Laterality Date  . HYMENECTOMY  25 years old     Current Outpatient Medications:  .  acetaminophen (TYLENOL) 325 MG tablet, Take 2 tablets (650 mg total) by mouth every 4 (four) hours as needed (for pain scale < 4). (Patient not taking: No sig reported), Disp: 30 tablet, Rfl: 0  The following portions of the patient's history were reviewed and updated as appropriate: allergies, current medications, past family history, past medical history, past social history, past surgical history and problem list.   Review of Systems:  Pertinent items noted in HPI and remainder of comprehensive ROS otherwise negative.   Objective:  Physical Exam BP (!) 88/54   Pulse 62   Resp 16   Ht 5\' 2"  (1.575 m)   Wt 142 lb (64.4 kg)   LMP 06/16/2020   Breastfeeding No   BMI 25.97 kg/m  CONSTITUTIONAL: Well-developed, well-nourished female in no acute distress.  HENT:  Normocephalic, atraumatic. External right and left ear normal. Oropharynx is clear and moist EYES: Conjunctivae and EOM are normal. Pupils are equal, round, and reactive to light. No scleral icterus.  NECK: Normal range of motion, supple, no masses SKIN: Skin is warm and dry. No rash noted. Not diaphoretic. No erythema. No pallor. NEUROLOGIC: Alert and oriented to person, place, and time. Normal reflexes, muscle tone coordination. No cranial nerve deficit noted. PSYCHIATRIC: Normal mood and affect. Normal behavior. Normal judgment and  thought content. CARDIOVASCULAR: Normal heart rate noted RESPIRATORY: Effort normal, no problems with respiration noted ABDOMEN: Soft, no distention noted.   PELVIC: deferred MUSCULOSKELETAL: Normal range of motion. No edema noted.  Labs and Imaging 13/02/2020 OB Comp Less 14 Wks  Result Date: 08/26/2020 CLINICAL DATA:  Vaginal bleeding. EXAM: OBSTETRIC <14 WK ULTRASOUND TECHNIQUE: Transabdominal ultrasound was performed for evaluation of the gestation as well as the maternal uterus and adnexal regions. COMPARISON:  July 19, 2019 FINDINGS: Intrauterine gestational sac: Single Yolk sac:  Visualized. Embryo:  Visualized. Cardiac Activity: Visualized. Heart Rate: 176 bpm CRL:   24.4 mm   9 w 1 d                  July 21, 2019 EDC: March 23, 2021 Subchorionic hemorrhage:  None visualized. Maternal uterus/adnexae: The bilateral ovaries are visualized and are normal in appearance. No pelvic free fluid is seen. IMPRESSION: Single, viable intrauterine pregnancy at approximately 9 weeks and 1 day gestation by ultrasound evaluation. Electronically Signed   By: March 25, 2021 M.D.   On: 08/26/2020 18:28   08/28/2020 OB Transvaginal  Result Date: 08/28/2020 CLINICAL DATA:  Spontaneous abortion. EXAM: OBSTETRIC <14 WK 08/30/2020 AND TRANSVAGINAL OB US TECHNIQUE: Both transabdominal and transvaginal ultrasound examinations were performed for complete evaluation of the gestation as well as the maternal uterus, adnexal regions, and pelvic cul-de-sac. Transvaginal technique was performed to assess early pregnancy. COMPARISON:  August 26, 2020 FINDINGS: Intrauterine gestational sac: None Subchorionic hemorrhage:  None visualized. Maternal uterus/adnexae: The  endometrium is thickened measuring approximately 3 cm in thickness. Echogenic material is noted within the endometrial canal. The ovaries are unremarkable. There is a trace amount of free fluid the patient's pelvis. IMPRESSION: 1. No IUP identified. 2. Thickened endometrium with echogenic  material within the endometrial canal may represent retained products of conception. 3. Trace pelvic free fluid. Electronically Signed   By: Katherine Mantle M.D.   On: 08/28/2020 03:17    Assessment & Plan:  1. Abortion - patient likely passed all pregnancy tissue - reviewed some spotting is normal - check HCG, if remains high, obtain US - call if has ongoing bleeding, increased bleeding, fever, etc - B-HCG Quant   Routine preventative health maintenance measures emphasized. Please refer to After Visit Summary for other counseling recommendations.   Return for no follow up needed at this time.  Total face-to-face time with patient: 15 minutes. Over 50% of encounter was spent on counseling and coordination of care.  Baldemar Lenis, MD, [facility]ending [facility] for [employer] [facility])

## 2020-09-13 LAB — HCG, QUANTITATIVE, PREGNANCY: HCG, Total, QN: 521 m[IU]/mL

## 2020-09-16 NOTE — BH Specialist Note (Signed)
Pt did not arrive to video visit and did not answer the phone ; Left HIPPA-compliant message to call back Annlee Glandon from [facility] at [phone] (main office) or [phone] (Harmoney Sienkiewicz's office).  ; left MyChart message for patient.      

## 2020-09-17 ENCOUNTER — Ambulatory Visit: Payer: Medicaid Other

## 2020-09-24 ENCOUNTER — Encounter: Payer: Medicaid Other | Admitting: Advanced Practice Midwife

## 2020-09-28 IMAGING — US TRANSVAGINAL OB ULTRASOUND
1 series · 15 of 28 positions shown · non-contrast
Comparison: 10/31/2018

CLINICAL DATA: Positive pregnancy test. 1st trimester pregnancy of
unknown anatomic location. Recent therapeutic abortion.

EXAM:
TRANSVAGINAL OB ULTRASOUND
TECHNIQUE: Transvaginal ultrasound was performed for complete evaluation of the
gestation as well as the maternal uterus, adnexal regions, and
pelvic cul-de-sac.

[Series 1: transvaginal ob ultrasound · 15 of 48 slices shown]
[im 1/48]
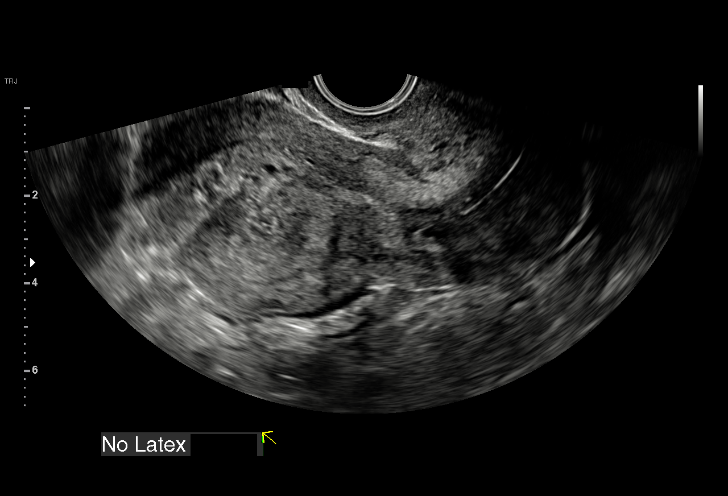
[im 4/48]
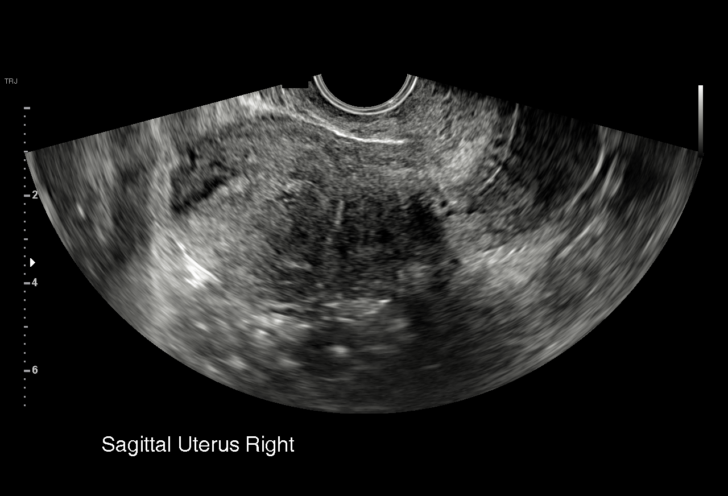
[im 7/48]
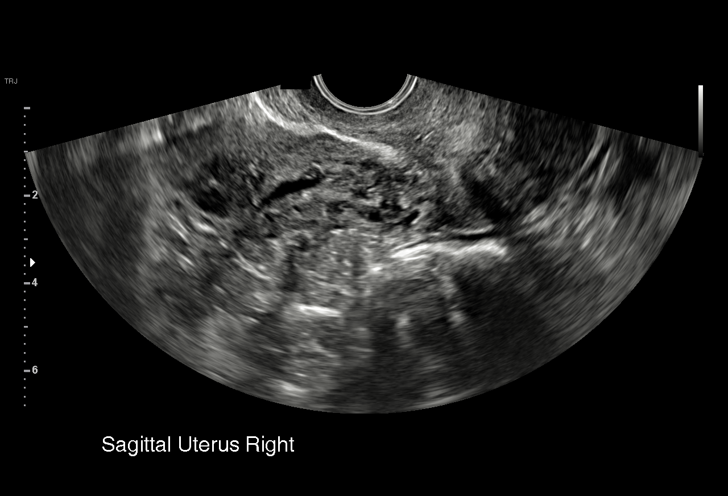
[im 11/48]
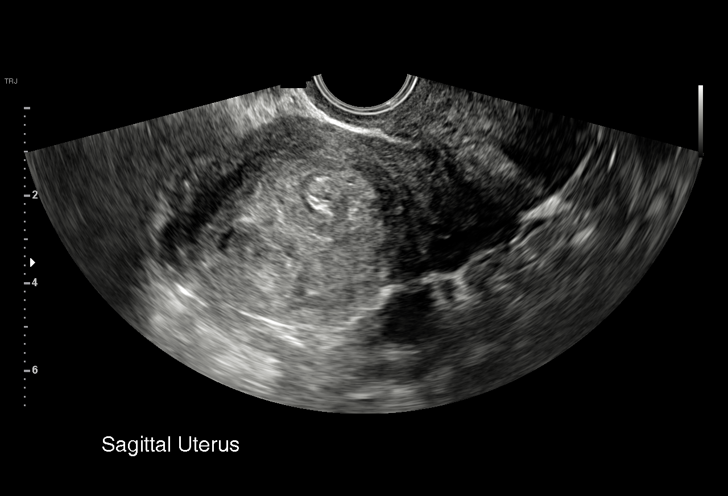
[im 14/48]
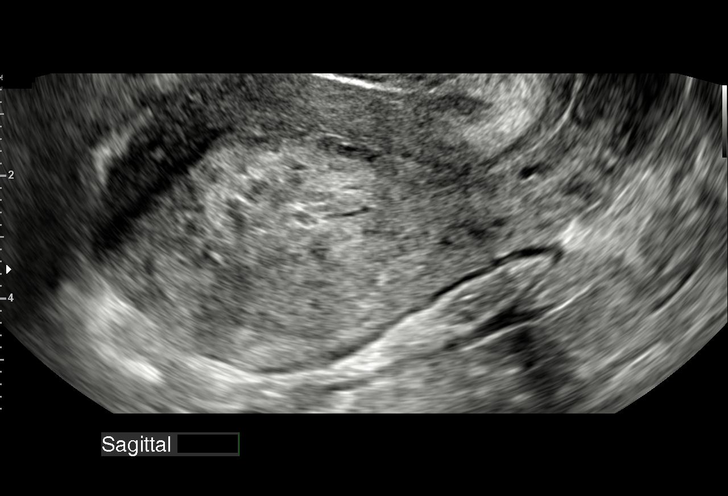
[im 18/48]
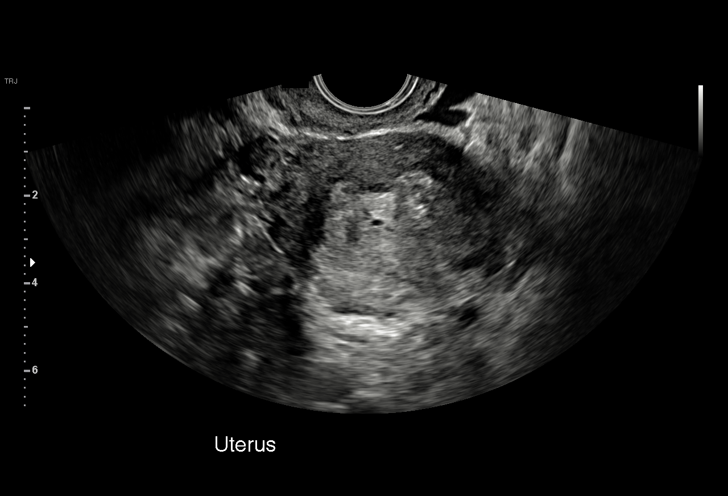
[im 21/48]
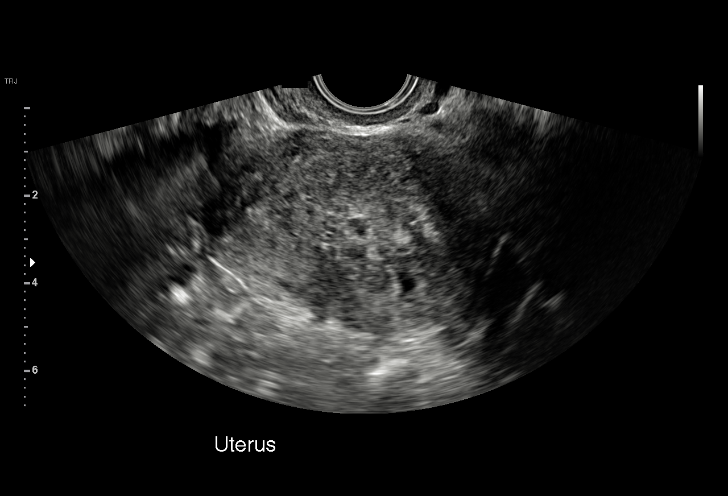
[im 25/48]
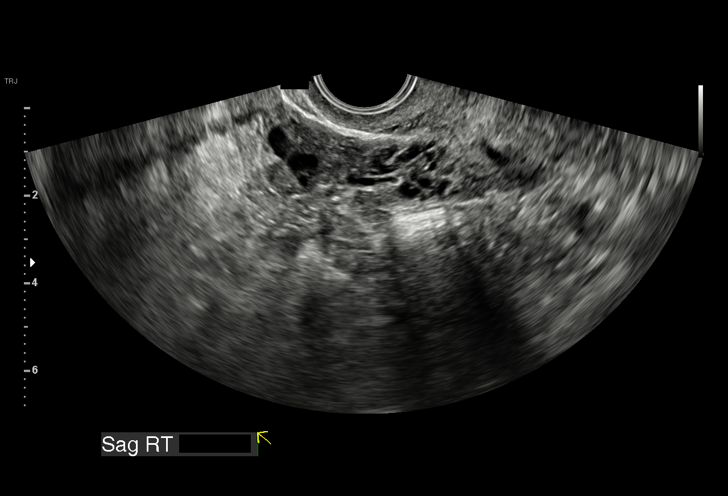
[im 27/48]
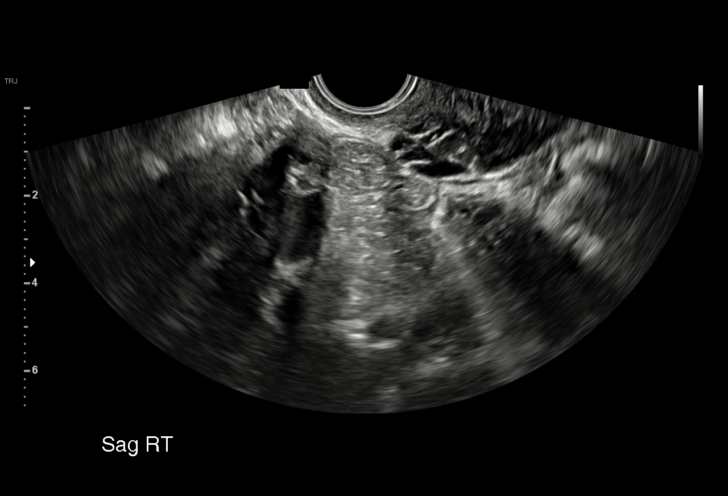
[im 30/48]
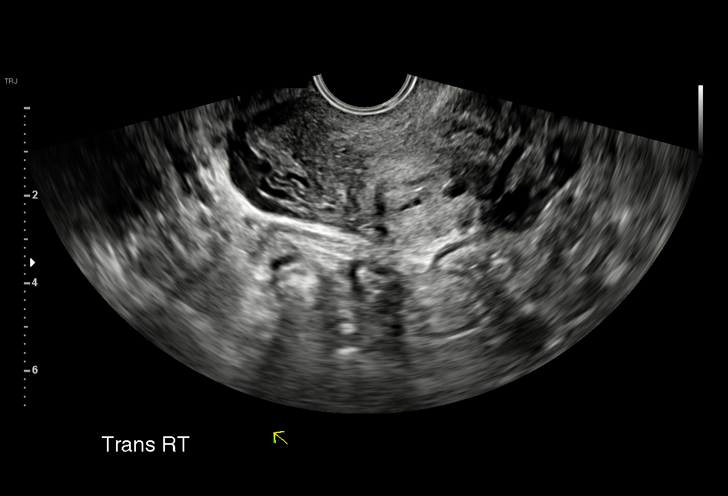
[im 34/48]
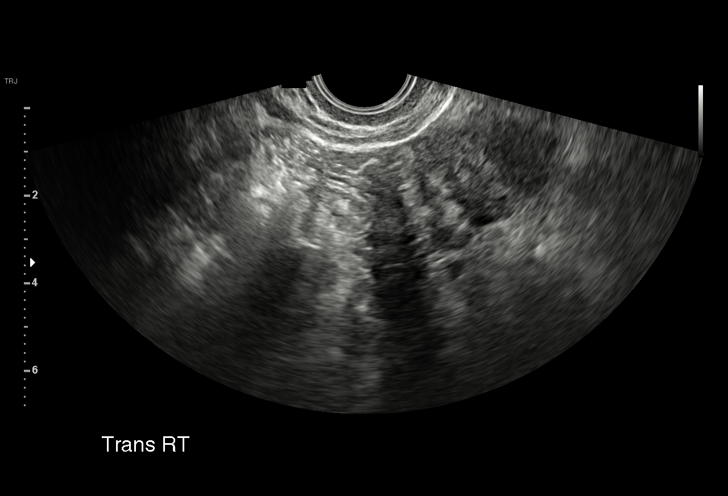
[im 37/48]
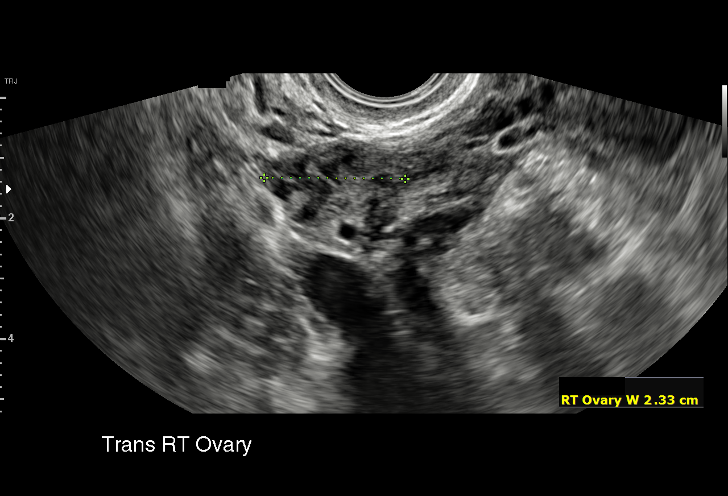
[im 41/48]
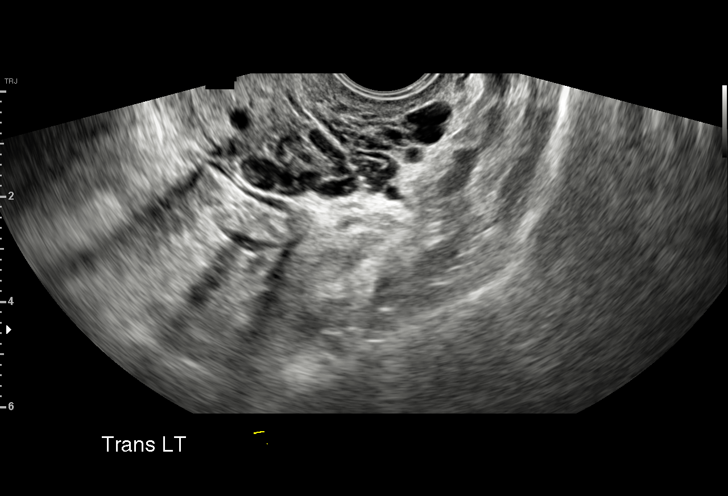
[im 44/48]
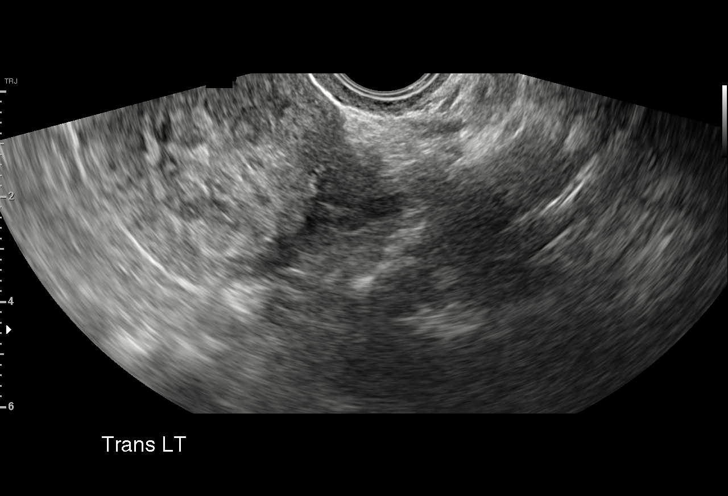
[im 48/48]
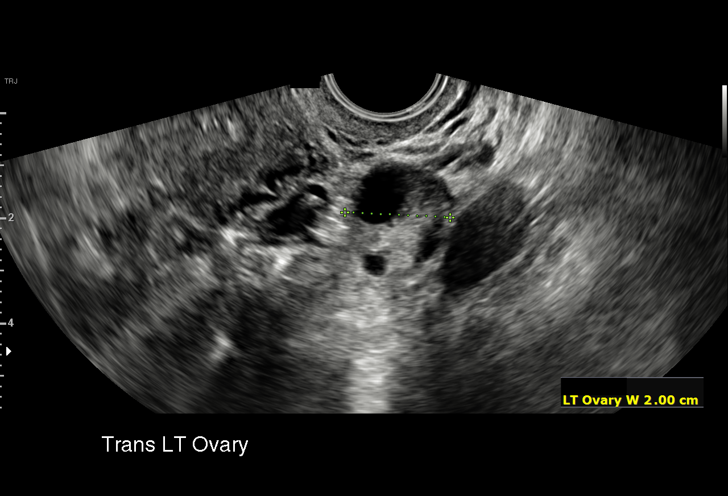

[15 of 28 positions shown; findings below may reference images not displayed]

FINDINGS: Intrauterine gestational sac: None

Maternal uterus/adnexae: Endometrial thickness measures 25 mm. This
shows heterogeneous echotexture with multiple tiny cystic foci. This
shows no significant change compared to prior study. No fibroids
identified.

Both ovaries are normal in appearance. Previously seen mildly
complex right ovarian cyst is no longer visualized. No adnexal mass
or abnormal free fluid identified.
IMPRESSION: Thickened heterogeneous endometrium with multiple tiny cystic foci,
without significant change compared to prior study. Differential
diagnosis remains retained products of conception and gestational
trophoblastic neoplasia. Recommend correlation with quantitative
beta hCG level.

Normal appearance of both ovaries. No adnexal mass or abnormal free
fluid.

## 2020-09-30 ENCOUNTER — Other Ambulatory Visit: Payer: Self-pay | Admitting: Advanced Practice Midwife

## 2020-09-30 ENCOUNTER — Ambulatory Visit: Payer: Medicaid Other | Admitting: Clinical

## 2020-09-30 DIAGNOSIS — Z5329 Procedure and treatment not carried out because of patient's decision for other reasons: Secondary | ICD-10-CM

## 2020-09-30 DIAGNOSIS — Z91199 Patient's noncompliance with other medical treatment and regimen due to unspecified reason: Secondary | ICD-10-CM

## 2020-09-30 DIAGNOSIS — O034 Incomplete spontaneous abortion without complication: Secondary | ICD-10-CM

## 2020-10-02 ENCOUNTER — Other Ambulatory Visit: Payer: Medicaid Other

## 2020-10-08 ENCOUNTER — Other Ambulatory Visit: Payer: Medicaid Other

## 2020-10-11 ENCOUNTER — Other Ambulatory Visit: Payer: Medicaid Other

## 2020-10-11 ENCOUNTER — Other Ambulatory Visit: Payer: Self-pay

## 2020-10-11 DIAGNOSIS — O039 Complete or unspecified spontaneous abortion without complication: Secondary | ICD-10-CM | POA: Diagnosis not present

## 2020-10-11 NOTE — Progress Notes (Signed)
Pt here for lab only visit. Lab reqs given to pt and pt sent for labs to be drawn.

## 2020-10-12 LAB — HCG, QUANTITATIVE, PREGNANCY: HCG, Total, QN: <3 m[IU]/mL

## 2020-10-16 IMAGING — US TRANSVAGINAL ULTRASOUND OF PELVIS
1 series · 13 of 25 positions shown · non-contrast
Comparison: Prior pelvic ultrasound 11/14/2018 and 10/31/2018

CLINICAL DATA: 22-year-old female with vaginal bleeding

EXAM:
TRANSABDOMINAL AND TRANSVAGINAL ULTRASOUND OF PELVIS
DOPPLER ULTRASOUND OF OVARIES
TECHNIQUE: Both transabdominal and transvaginal ultrasound examinations of the
pelvis were performed. Transabdominal technique was performed for
global imaging of the pelvis including uterus, ovaries, adnexal
regions, and pelvic cul-de-sac.
It was necessary to proceed with endovaginal exam following the
transabdominal exam to visualize the endometrium. Color and duplex
Doppler ultrasound was utilized to evaluate blood flow to the
ovaries.

[Series 1: transvaginal ultrasound of pelvis · 13 of 78 slices shown]
[im 1/78]
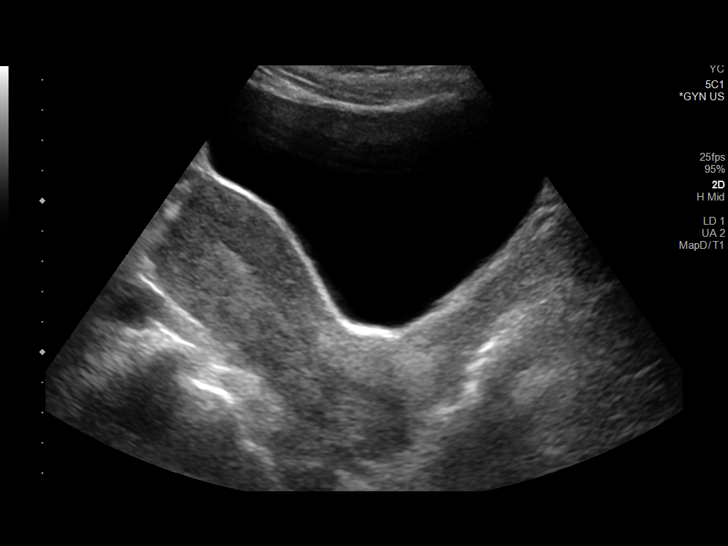
[im 7/78]
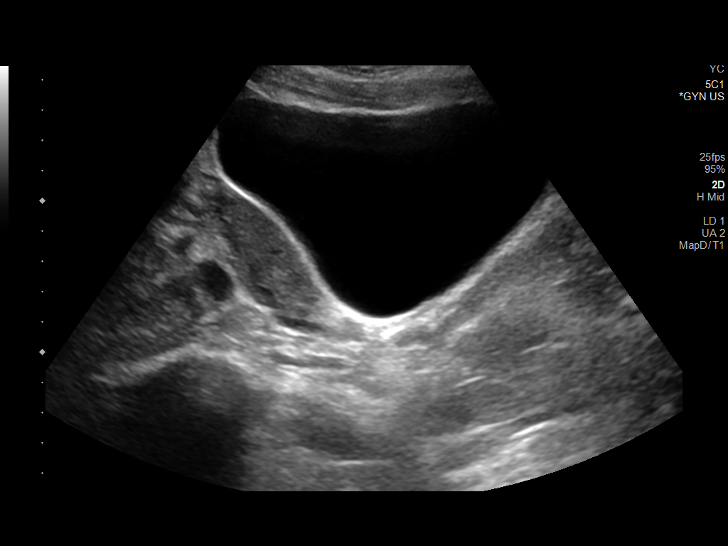
[im 13/78]
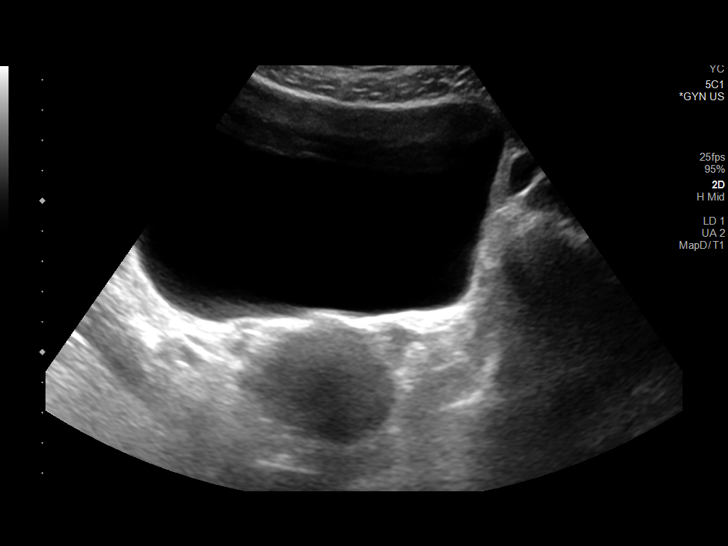
[im 20/78]
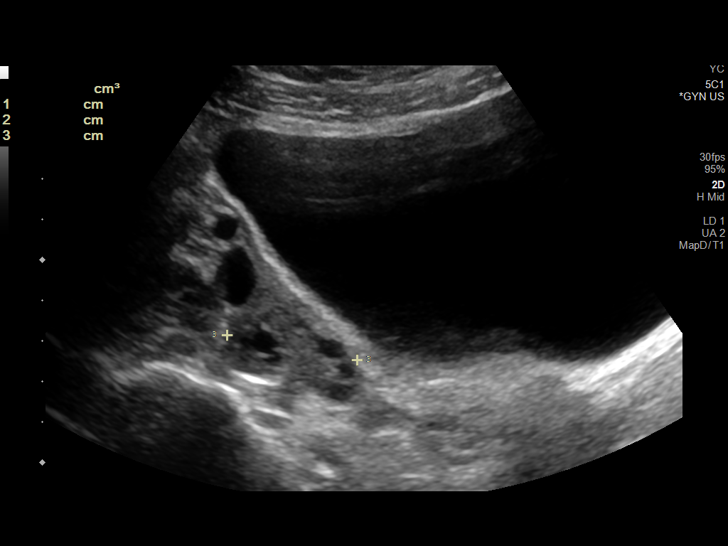
[im 26/78]
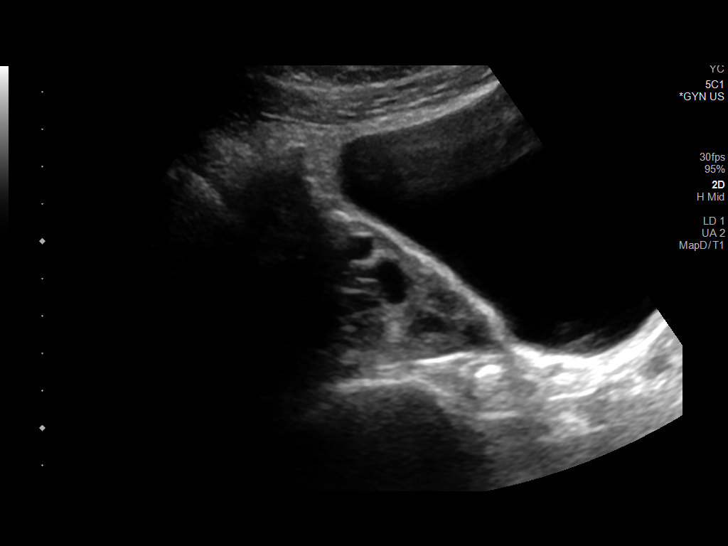
[im 33/78]
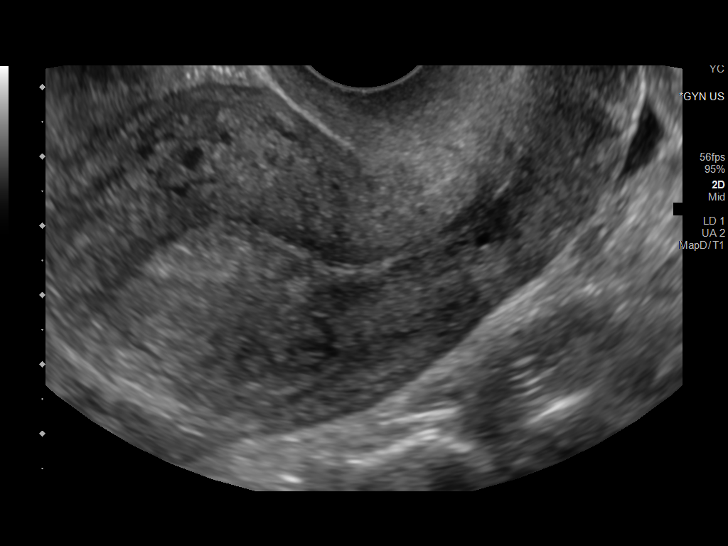
[im 39/78]
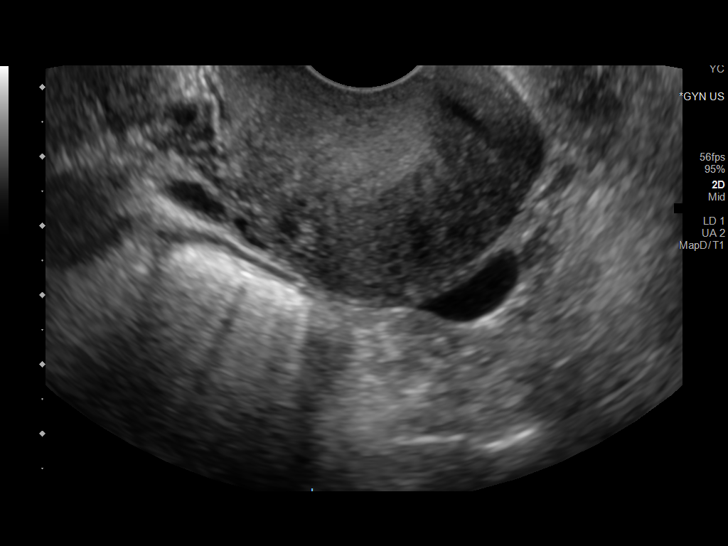
[im 45/78]
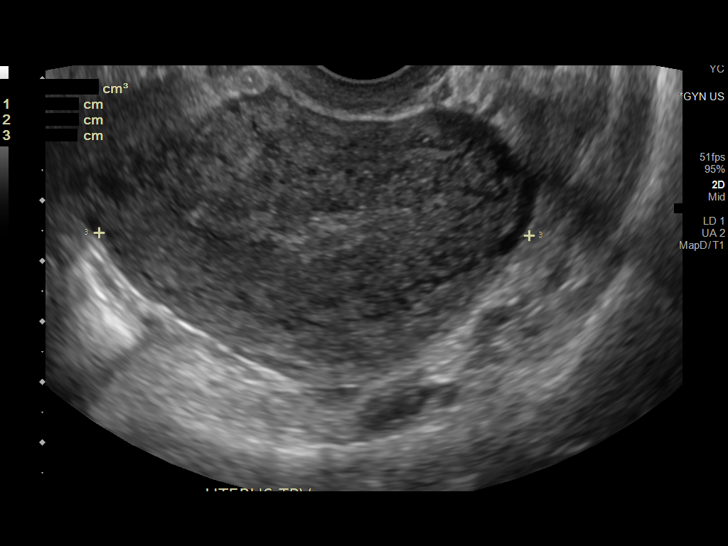
[im 52/78]
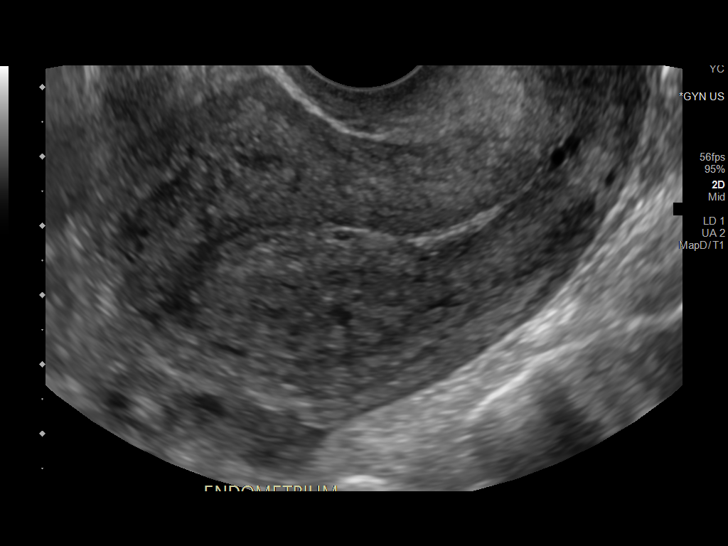
[im 58/78]
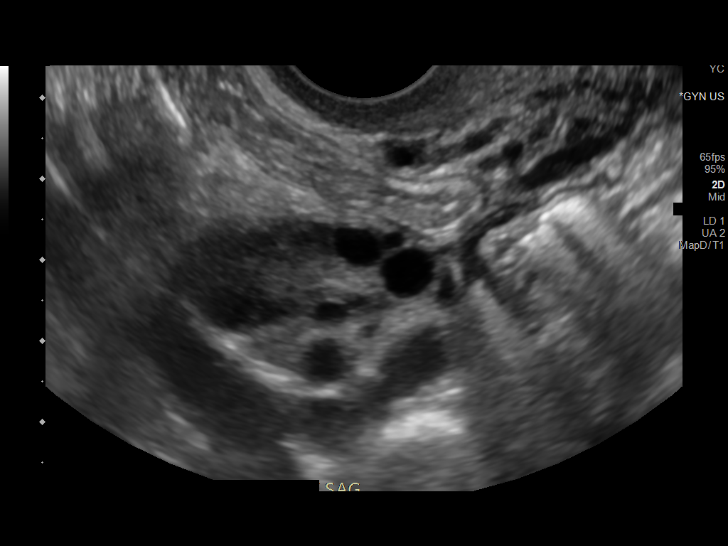
[im 65/78]
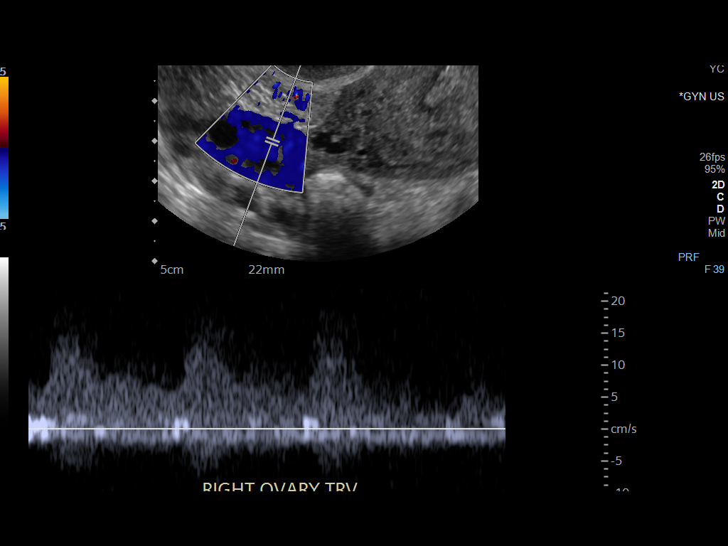
[im 71/78]
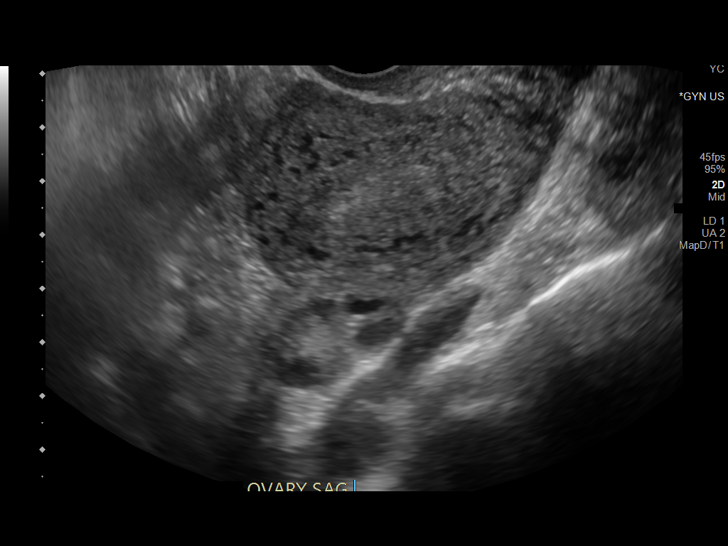
[im 78/78]
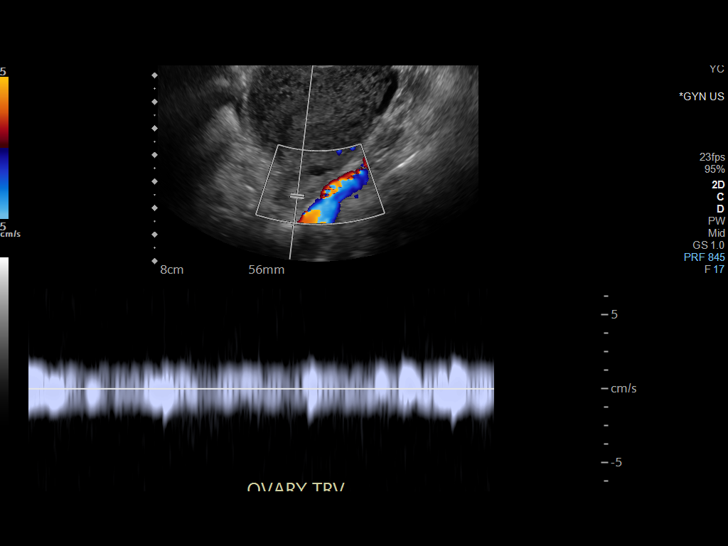

[13 of 25 positions shown; findings below may reference images not displayed]

FINDINGS: Uterus

Measurements: 9.2 x 4.8 x 7.1 cm = volume: 163 mL. No fibroids or
other mass visualized.

Endometrium

Thickness: 7 mm.  No focal abnormality visualized.

Right ovary

Measurements: 3.3 x 1.4 x 2.4 cm = volume: 5.9 mL. Normal
appearance/no adnexal mass.

Left ovary

Measurements: 3.1 x 1.7 x 2.5 cm = volume: 6.9 mL. Normal
appearance/no adnexal mass.

Pulsed Doppler evaluation of both ovaries demonstrates normal
low-resistance arterial and venous waveforms.

Other findings

Small free fluid in the pelvis is likely physiologic.
IMPRESSION: 1. Normal transabdominal and transvaginal ultrasound.
2. Interval resolution of irregular, thickened endometrium with
multiple cystic foci.
3. Normal arterial and venous signal in both ovaries.

## 2021-03-21 ENCOUNTER — Telehealth: Payer: Self-pay | Admitting: *Deleted

## 2021-03-21 MED ORDER — CLINDAMYCIN HCL 300 MG PO CAPS: 300.0000 mg | ORAL_CAPSULE | Freq: Four times a day (QID) | ORAL | 0 refills | Status: DC

## 2021-03-21 NOTE — Telephone Encounter (Signed)
Pt called stating that she is still breast feeding and her breast are engorged, painful and she states that she is running a fever.  She was given an appt next week but states that she has to have something now.  I spoke with Jesse Fall who sent in RX for Clindamycin 300 mg QID to [facility].

## 2021-03-23 DIAGNOSIS — D649 Anemia, unspecified: Secondary | ICD-10-CM | POA: Diagnosis not present

## 2021-03-23 DIAGNOSIS — E876 Hypokalemia: Secondary | ICD-10-CM | POA: Diagnosis not present

## 2021-03-23 DIAGNOSIS — D72829 Elevated white blood cell count, unspecified: Secondary | ICD-10-CM | POA: Diagnosis not present

## 2021-03-23 DIAGNOSIS — N611 Abscess of the breast and nipple: Secondary | ICD-10-CM | POA: Diagnosis not present

## 2021-03-23 DIAGNOSIS — I959 Hypotension, unspecified: Secondary | ICD-10-CM | POA: Diagnosis not present

## 2021-03-23 DIAGNOSIS — R000 Tachycardia, unspecified: Secondary | ICD-10-CM | POA: Diagnosis not present

## 2021-03-23 DIAGNOSIS — R531 Weakness: Secondary | ICD-10-CM | POA: Diagnosis not present

## 2021-03-23 DIAGNOSIS — A419 Sepsis, unspecified organism: Secondary | ICD-10-CM | POA: Diagnosis not present

## 2021-03-23 DIAGNOSIS — Z87891 Personal history of nicotine dependence: Secondary | ICD-10-CM | POA: Diagnosis not present

## 2021-03-23 DIAGNOSIS — R21 Rash and other nonspecific skin eruption: Secondary | ICD-10-CM | POA: Diagnosis not present

## 2021-03-23 DIAGNOSIS — Z881 Allergy status to other antibiotic agents status: Secondary | ICD-10-CM | POA: Diagnosis not present

## 2021-03-23 DIAGNOSIS — N644 Mastodynia: Secondary | ICD-10-CM | POA: Diagnosis not present

## 2021-03-23 DIAGNOSIS — N6002 Solitary cyst of left breast: Secondary | ICD-10-CM | POA: Diagnosis not present

## 2021-03-23 DIAGNOSIS — Z79899 Other long term (current) drug therapy: Secondary | ICD-10-CM | POA: Diagnosis not present

## 2021-03-23 DIAGNOSIS — E871 Hypo-osmolality and hyponatremia: Secondary | ICD-10-CM | POA: Diagnosis not present

## 2021-03-23 DIAGNOSIS — G479 Sleep disorder, unspecified: Secondary | ICD-10-CM | POA: Diagnosis not present

## 2021-03-23 DIAGNOSIS — N61 Mastitis without abscess: Secondary | ICD-10-CM | POA: Diagnosis not present

## 2021-03-24 DIAGNOSIS — N61 Mastitis without abscess: Secondary | ICD-10-CM | POA: Diagnosis not present

## 2021-03-25 ENCOUNTER — Ambulatory Visit: Payer: Medicaid Other

## 2021-03-25 DIAGNOSIS — A419 Sepsis, unspecified organism: Secondary | ICD-10-CM | POA: Diagnosis not present

## 2021-03-25 DIAGNOSIS — N61 Mastitis without abscess: Secondary | ICD-10-CM | POA: Diagnosis not present

## 2021-03-26 DIAGNOSIS — N611 Abscess of the breast and nipple: Secondary | ICD-10-CM | POA: Diagnosis not present

## 2021-03-26 DIAGNOSIS — E871 Hypo-osmolality and hyponatremia: Secondary | ICD-10-CM | POA: Diagnosis not present

## 2021-03-26 DIAGNOSIS — D649 Anemia, unspecified: Secondary | ICD-10-CM | POA: Diagnosis not present

## 2021-03-26 DIAGNOSIS — N61 Mastitis without abscess: Secondary | ICD-10-CM | POA: Diagnosis not present

## 2021-03-26 DIAGNOSIS — L0390 Cellulitis, unspecified: Secondary | ICD-10-CM | POA: Diagnosis not present

## 2021-03-26 DIAGNOSIS — N6489 Other specified disorders of breast: Secondary | ICD-10-CM | POA: Diagnosis not present

## 2021-03-26 DIAGNOSIS — A419 Sepsis, unspecified organism: Secondary | ICD-10-CM | POA: Diagnosis not present

## 2021-03-26 DIAGNOSIS — E876 Hypokalemia: Secondary | ICD-10-CM | POA: Diagnosis not present

## 2021-03-27 DIAGNOSIS — N611 Abscess of the breast and nipple: Secondary | ICD-10-CM | POA: Diagnosis not present

## 2021-03-27 DIAGNOSIS — N61 Mastitis without abscess: Secondary | ICD-10-CM | POA: Diagnosis not present

## 2021-03-27 DIAGNOSIS — D649 Anemia, unspecified: Secondary | ICD-10-CM | POA: Diagnosis not present

## 2021-03-27 DIAGNOSIS — A419 Sepsis, unspecified organism: Secondary | ICD-10-CM | POA: Diagnosis not present

## 2021-03-27 DIAGNOSIS — L0390 Cellulitis, unspecified: Secondary | ICD-10-CM | POA: Diagnosis not present

## 2021-03-27 DIAGNOSIS — E876 Hypokalemia: Secondary | ICD-10-CM | POA: Diagnosis not present

## 2021-03-27 DIAGNOSIS — E871 Hypo-osmolality and hyponatremia: Secondary | ICD-10-CM | POA: Diagnosis not present

## 2021-03-28 DIAGNOSIS — J90 Pleural effusion, not elsewhere classified: Secondary | ICD-10-CM | POA: Diagnosis not present

## 2021-03-28 DIAGNOSIS — E876 Hypokalemia: Secondary | ICD-10-CM | POA: Diagnosis not present

## 2021-03-28 DIAGNOSIS — N61 Mastitis without abscess: Secondary | ICD-10-CM | POA: Diagnosis not present

## 2021-03-28 DIAGNOSIS — R928 Other abnormal and inconclusive findings on diagnostic imaging of breast: Secondary | ICD-10-CM | POA: Diagnosis not present

## 2021-03-28 DIAGNOSIS — L0390 Cellulitis, unspecified: Secondary | ICD-10-CM | POA: Diagnosis not present

## 2021-03-28 DIAGNOSIS — N611 Abscess of the breast and nipple: Secondary | ICD-10-CM | POA: Diagnosis not present

## 2021-03-28 DIAGNOSIS — H7092 Unspecified mastoiditis, left ear: Secondary | ICD-10-CM | POA: Diagnosis not present

## 2021-03-28 DIAGNOSIS — A419 Sepsis, unspecified organism: Secondary | ICD-10-CM | POA: Diagnosis not present

## 2021-03-28 DIAGNOSIS — R590 Localized enlarged lymph nodes: Secondary | ICD-10-CM | POA: Diagnosis not present

## 2021-03-28 DIAGNOSIS — D649 Anemia, unspecified: Secondary | ICD-10-CM | POA: Diagnosis not present

## 2021-03-28 DIAGNOSIS — E871 Hypo-osmolality and hyponatremia: Secondary | ICD-10-CM | POA: Diagnosis not present

## 2021-03-29 DIAGNOSIS — N61 Mastitis without abscess: Secondary | ICD-10-CM | POA: Diagnosis not present

## 2021-03-30 DIAGNOSIS — N61 Mastitis without abscess: Secondary | ICD-10-CM | POA: Diagnosis not present

## 2021-03-31 DIAGNOSIS — E876 Hypokalemia: Secondary | ICD-10-CM | POA: Diagnosis not present

## 2021-03-31 DIAGNOSIS — N61 Mastitis without abscess: Secondary | ICD-10-CM | POA: Diagnosis not present

## 2021-03-31 DIAGNOSIS — N611 Abscess of the breast and nipple: Secondary | ICD-10-CM | POA: Diagnosis not present

## 2021-03-31 DIAGNOSIS — L0390 Cellulitis, unspecified: Secondary | ICD-10-CM | POA: Diagnosis not present

## 2021-03-31 DIAGNOSIS — E871 Hypo-osmolality and hyponatremia: Secondary | ICD-10-CM | POA: Diagnosis not present

## 2021-03-31 DIAGNOSIS — A419 Sepsis, unspecified organism: Secondary | ICD-10-CM | POA: Diagnosis not present

## 2021-03-31 DIAGNOSIS — D649 Anemia, unspecified: Secondary | ICD-10-CM | POA: Diagnosis not present

## 2021-04-01 ENCOUNTER — Telehealth: Payer: Self-pay

## 2021-04-01 NOTE — Telephone Encounter (Signed)
Transition Care Management Unsuccessful Follow-up Telephone Call  Date of discharge and from where:  03/31/2021-[facility]   Attempts:  1st Attempt  Reason for unsuccessful TCM follow-up call:  Left voice message

## 2021-04-04 NOTE — Telephone Encounter (Signed)
Transition Care Management Unsuccessful Follow-up Telephone Call  Date of discharge and from where:  04/04/2021 from [facility]  Attempts:  2nd Attempt  Reason for unsuccessful TCM follow-up call:  Left voice message

## 2021-04-06 NOTE — Telephone Encounter (Signed)
Transition Care Management Unsuccessful Follow-up Telephone Call  Date of discharge and from where:  04/04/2021 from [facility]  Attempts:  3rd Attempt  Reason for unsuccessful TCM follow-up call:  Unable to reach patient

## 2021-08-10 NOTE — L&D Delivery Note (Signed)
Delivery Note Julie Wilkinson is a 26 y.o. [id number] at [redacted]w[redacted]d admitted for [facility] labor.   GBS Status:  neg Maximum Maternal Temperature: 98.7  Labor course: Initial SVE: 5/90/-1. Augmentation with:  nothing . She then progressed to complete.  ROM: 4h 73m with clear fluid  Birth: By [name], MS w/ my hands guiding hers- At 2102 a viable female was delivered via spontaneous vaginal delivery (Presentation: ROA w/ compound hand). Nuchal cord present: Yes, loose x 2, delivered through and reduced immediately after birth.  Shoulders and body delivered in usual fashion. Infant placed directly on mom's abdomen for bonding/skin-to-skin, baby dried and stimulated. Cord clamped x 2 after 1 minute and cut by Oby (pt & FOB declined).  Cord blood collected.  The placenta separated spontaneously and delivered via gentle cord traction.  Pitocin infused rapidly IV per protocol.  Fundus firm with massage.  Placenta inspected and appears to be intact with a 3 VC.  Placenta/Cord with the following complications: none .  Cord pH: not done Sponge and instrument count were correct x2.  Intrapartum complications:  None Anesthesia:  epidural Episiotomy: none Lacerations:  2nd degree and Rt periurethral (hemostatic) Suture Repair: 3.0 vicryl EBL (mL): 124   Infant: APGAR (1 MIN): 9   APGAR (5 MINS):   APGAR (10 MINS):    Infant weight: pending  Mom to postpartum.  Baby to Couplet care / Skin to Skin. Placenta to L&D   Plans to Breastfeed Contraception:  undecided Circumcision: wants inpatient  Note sent to [facility]: KV for pp visit.  [place], [facility] 06/16/2022 9:27 PM

## 2021-09-02 ENCOUNTER — Other Ambulatory Visit: Payer: Self-pay

## 2021-09-02 ENCOUNTER — Ambulatory Visit: Payer: Medicaid Other | Admitting: Advanced Practice Midwife

## 2021-09-02 ENCOUNTER — Encounter: Payer: Self-pay | Admitting: Advanced Practice Midwife

## 2021-09-02 VITALS — BP 105/70 | HR 102 | Ht 62.0 in | Wt 120.0 lb

## 2021-09-02 DIAGNOSIS — Z87898 Personal history of other specified conditions: Secondary | ICD-10-CM | POA: Insufficient documentation

## 2021-09-02 DIAGNOSIS — N61 Mastitis without abscess: Secondary | ICD-10-CM

## 2021-09-02 MED ORDER — CEPHALEXIN 500 MG PO CAPS
500.0000 mg | ORAL_CAPSULE | Freq: Four times a day (QID) | ORAL | 0 refills | Status: AC
Start: 2021-09-02 — End: 2021-09-12

## 2021-09-02 NOTE — Progress Notes (Signed)
° °  GYNECOLOGY PROGRESS NOTE  History:  26 y.o. Julie Wilkinson presents to [facility] office today for problem gyn visit. She reports pain and redness in her left breast and fever of 101 at home. She has hx significant for mastitis with hospital admission 03/2021. She was weaning her child at that time but has not breastfed since 03/2021.  These symptoms are similar to her infection last year so she wanted to get checked out.   She has not noticed any masses in the breast, just generalized redness/tenderness.  She denies h/a, dizziness, shortness of breath, or n/v.  The following portions of the patient's history were reviewed and updated as appropriate: allergies, current medications, past family history, past medical history, past social history, past surgical history and problem list. Last pap smear on 02/08/2019 was normal.  Health Maintenance Due  Topic Date Due   COVID-19 Vaccine (1) Never done   Hepatitis C Screening  Never done   INFLUENZA VACCINE  03/10/2021     Review of Systems:  Pertinent items are noted in HPI.   Objective:  Physical Exam Blood pressure 105/70, pulse (!) 102, height 5\' 2"  (1.575 m), weight 120 lb (54.4 kg), last menstrual period 09/01/2021, not currently breastfeeding. VS reviewed, nursing note reviewed,  Constitutional: well developed, well nourished, no distress HEENT: normocephalic CV: normal rate Pulm/chest wall: normal effort Breast Exam: visual inspection of left breast with generalized erythema on upper portion of breast, no edema noted, generalized tenderness to palpation with no palpable masses. Right breast wnl.  Abdomen: soft Neuro: alert and oriented x 3 Skin: warm, dry Psych: affect normal Pelvic exam: Deferred  Assessment & Plan:  1. Mastitis, left, acute --Start abx today, increase PO fluids, take ibuprofen for pain/swelling, use ice PRN --Close f/u with visit in 2-3 days in office given history of severe mastitis  - cephALEXin (KEFLEX) 500  MG capsule; Take 1 capsule (500 mg total) by mouth 4 (four) times daily for 10 days.  Dispense: 40 capsule; Refill: 0   09/03/2021, CNM 10:19 AM

## 2021-09-02 NOTE — Progress Notes (Signed)
Pt states left breast is red and is "feeling heavy"

## 2021-09-03 NOTE — Progress Notes (Signed)
° °  GYNECOLOGY OFFICE VISIT NOTE  History:   Julie Wilkinson is a 26 y.o. [phone] here today for short term follow up for mastitis given her history of mastitis requiring hospitalization.  Her left breast is the breast affected.  Since starting the keflex, she feels much better. The redness has resolved.   She denies any abnormal vaginal discharge, bleeding, pelvic pain or other concerns.     Past Medical History:  Diagnosis Date   Anxiety 2017    Past Surgical History:  Procedure Laterality Date   HYMENECTOMY  26 years old    The following portions of the patient's history were reviewed and updated as appropriate: allergies, current medications, past family history, past medical history, past social history, past surgical history and problem list.   Health Maintenance:   Diagnosis  Date Value Ref Range Status  02/08/2019   Final   NEGATIVE FOR INTRAEPITHELIAL LESIONS OR MALIGNANCY.    Review of Systems:  Pertinent items noted in HPI and remainder of comprehensive ROS otherwise negative.  Physical Exam:  LMP 09/01/2021  CONSTITUTIONAL: Well-developed, well-nourished female in no acute distress.  HEENT:  Normocephalic, atraumatic. External right and left ear normal. No scleral icterus.  NECK: Normal range of motion, supple, no masses noted on observation SKIN: No rash noted. Not diaphoretic. No erythema. No pallor. MUSCULOSKELETAL: Normal range of motion. No edema noted. NEUROLOGIC: Alert and oriented to person, place, and time. Normal muscle tone coordination. No cranial nerve deficit noted. PSYCHIATRIC: Normal mood and affect. Normal behavior. Normal judgment and thought content.  Breasts: breasts appear normal, no suspicious masses, no skin or nipple changes or axillary nodes.  Labs and Imaging No results found for this or any previous visit (from the past 168 hour(s)). No results found.  Assessment and Plan:   1. Mastitis, left, acute - Continue Keflex as prescribed  until completed - May return to work - Low threshold to call for possible mastitis given h/o hospitalization in the past.   Routine preventative health maintenance measures emphasized. Please refer to After Visit Summary for other counseling recommendations.   No follow-ups on file.  Milas Hock, MD, FACOG Obstetrician & Gynecologist, [facility] for [facility], [facility]

## 2021-09-04 ENCOUNTER — Other Ambulatory Visit: Payer: Self-pay

## 2021-09-04 ENCOUNTER — Ambulatory Visit: Payer: Medicaid Other | Admitting: Obstetrics and Gynecology

## 2021-09-04 ENCOUNTER — Encounter: Payer: Self-pay | Admitting: Obstetrics and Gynecology

## 2021-09-04 VITALS — BP 110/70 | HR 82 | Temp 98.3°F | Resp 16 | Ht 62.0 in | Wt 120.0 lb

## 2021-09-04 DIAGNOSIS — N61 Mastitis without abscess: Secondary | ICD-10-CM

## 2021-10-22 ENCOUNTER — Other Ambulatory Visit: Payer: Self-pay

## 2021-10-22 ENCOUNTER — Ambulatory Visit (INDEPENDENT_AMBULATORY_CARE_PROVIDER_SITE_OTHER): Payer: Medicaid Other

## 2021-10-22 DIAGNOSIS — Z3201 Encounter for pregnancy test, result positive: Secondary | ICD-10-CM | POA: Diagnosis not present

## 2021-10-22 DIAGNOSIS — Z3200 Encounter for pregnancy test, result unknown: Secondary | ICD-10-CM

## 2021-10-22 LAB — POCT URINE PREGNANCY: Preg Test, Ur: POSITIVE — AB

## 2021-10-22 NOTE — Progress Notes (Signed)
Pt here for UPT. UPT positive. LMP 09/25/21. NOB appt scheduled for 4/25. ?

## 2021-11-06 ENCOUNTER — Encounter: Payer: Self-pay | Admitting: Advanced Practice Midwife

## 2021-11-10 ENCOUNTER — Encounter: Payer: Self-pay | Admitting: Advanced Practice Midwife

## 2021-11-11 ENCOUNTER — Telehealth: Payer: Self-pay | Admitting: *Deleted

## 2021-11-11 MED ORDER — PROMETHAZINE HCL 25 MG PO TABS: 25.0000 mg | ORAL_TABLET | Freq: Four times a day (QID) | ORAL | 1 refills | Status: DC | PRN

## 2021-11-11 NOTE — Telephone Encounter (Signed)
Pt called requesting something for [place] in this pregnancy.  She des have a NOB appt coming up soon.  RX sent to [facility] in [address] ?

## 2021-11-14 ENCOUNTER — Inpatient Hospital Stay (HOSPITAL_COMMUNITY): Payer: Medicaid Other

## 2021-11-14 ENCOUNTER — Inpatient Hospital Stay (HOSPITAL_COMMUNITY)
Admission: AD | Admit: 2021-11-14 | Discharge: 2021-11-14 | Disposition: A | Discharge status: Home / Self Care | Payer: Medicaid Other | Attending: Obstetrics & Gynecology | Admitting: Obstetrics & Gynecology

## 2021-11-14 ENCOUNTER — Encounter: Payer: Self-pay | Admitting: Advanced Practice Midwife

## 2021-11-14 ENCOUNTER — Other Ambulatory Visit: Payer: Self-pay

## 2021-11-14 ENCOUNTER — Encounter (HOSPITAL_COMMUNITY): Payer: Self-pay | Admitting: *Deleted

## 2021-11-14 DIAGNOSIS — O209 Hemorrhage in early pregnancy, unspecified: Secondary | ICD-10-CM | POA: Insufficient documentation

## 2021-11-14 DIAGNOSIS — N939 Abnormal uterine and vaginal bleeding, unspecified: Secondary | ICD-10-CM | POA: Diagnosis not present

## 2021-11-14 DIAGNOSIS — Z3A01 Less than 8 weeks gestation of pregnancy: Secondary | ICD-10-CM | POA: Insufficient documentation

## 2021-11-14 DIAGNOSIS — O200 Threatened abortion: Secondary | ICD-10-CM

## 2021-11-14 DIAGNOSIS — N8312 Corpus luteum cyst of left ovary: Secondary | ICD-10-CM | POA: Diagnosis not present

## 2021-11-14 DIAGNOSIS — O418X10 Other specified disorders of amniotic fluid and membranes, first trimester, not applicable or unspecified: Secondary | ICD-10-CM

## 2021-11-14 LAB — CBC
HCT: 37.5 % (ref 36.0–46.0)
Hemoglobin: 12.3 g/dL (ref 12.0–15.0)
MCH: 29.3 pg (ref 26.0–34.0)
MCHC: 32.8 g/dL (ref 30.0–36.0)
MCV: 89.3 fL (ref 80.0–100.0)
Platelets: 285 10*3/uL (ref 150–400)
RBC: 4.2 MIL/uL (ref 3.87–5.11)
RDW: 13.5 % (ref 11.5–15.5)
WBC: 8.2 10*3/uL (ref 4.0–10.5)
nRBC: 0 % (ref 0.0–0.2)

## 2021-11-14 LAB — HCG, QUANTITATIVE, PREGNANCY: hCG, Beta Chain, Quant, S: 91490 m[IU]/mL — ABNORMAL HIGH (ref <5)

## 2021-11-14 LAB — WET PREP, GENITAL
Clue Cells Wet Prep HPF POC: NONE SEEN
Sperm: NONE SEEN
Trich, Wet Prep: NONE SEEN
WBC, Wet Prep HPF POC: <10 (ref <10)
Yeast Wet Prep HPF POC: NONE SEEN

## 2021-11-14 LAB — URINALYSIS, ROUTINE W REFLEX MICROSCOPIC
Bilirubin Urine: NEGATIVE
Glucose, UA: NEGATIVE mg/dL
Hgb urine dipstick: NEGATIVE
Ketones, ur: NEGATIVE mg/dL
Leukocytes,Ua: NEGATIVE
Nitrite: NEGATIVE
Protein, ur: NEGATIVE mg/dL
Specific Gravity, Urine: 1.004 — ABNORMAL LOW (ref 1.005–1.030)
pH: 8 (ref 5.0–8.0)

## 2021-11-14 LAB — HIV ANTIBODY (ROUTINE TESTING W REFLEX): HIV Screen 4th Generation wRfx: NONREACTIVE

## 2021-11-14 NOTE — MAU Note (Signed)
Julie Wilkinson is a 26 y.o. at [redacted]w[redacted]d here in MAU reporting: spotting with wiping that began this morning.  Denies recent intercourse and pain. ?LMP: 09/25/2021 ?Onset of complaint: today ?Pain score: 0 ?Vitals:  ? 11/14/21 1112  ?BP: (!) 104/55  ?Pulse: 83  ?Resp: 18  ?Temp: 99.4 ?F (37.4 ?C)  ?SpO2: 100%  ?   ?FHT:N/A ?Lab orders placed from triage:   UA ?

## 2021-11-14 NOTE — Discharge Instructions (Signed)
Return to MAU: If you have heavier bleeding that soaks through more that 2 pads per hour for an hour or more If you bleed so much that you feel like you might pass out or you do pass out If you have significant abdominal pain that is not improved with Tylenol 1000 mg every 8 hours as needed for pain If you develop a fever > 100.5  

## 2021-11-17 LAB — GC/CHLAMYDIA PROBE AMP (~~LOC~~) NOT AT ARMC
Chlamydia: NEGATIVE
Comment: NEGATIVE
Comment: NORMAL
Neisseria Gonorrhea: NEGATIVE

## 2021-12-01 ENCOUNTER — Encounter: Payer: Self-pay | Admitting: Obstetrics and Gynecology

## 2021-12-02 ENCOUNTER — Ambulatory Visit (INDEPENDENT_AMBULATORY_CARE_PROVIDER_SITE_OTHER): Payer: Medicaid Other

## 2021-12-02 ENCOUNTER — Other Ambulatory Visit (HOSPITAL_COMMUNITY)
Admission: RE | Admit: 2021-12-02 | Discharge: 2021-12-02 | Disposition: A | Discharge status: Home / Self Care | Payer: Medicaid Other | Source: Ambulatory Visit

## 2021-12-02 VITALS — BP 108/69 | HR 89 | Wt 126.0 lb

## 2021-12-02 DIAGNOSIS — Z3480 Encounter for supervision of other normal pregnancy, unspecified trimester: Secondary | ICD-10-CM | POA: Diagnosis not present

## 2021-12-02 DIAGNOSIS — F319 Bipolar disorder, unspecified: Secondary | ICD-10-CM

## 2021-12-02 DIAGNOSIS — Z3A09 9 weeks gestation of pregnancy: Secondary | ICD-10-CM | POA: Diagnosis not present

## 2021-12-02 DIAGNOSIS — O99341 Other mental disorders complicating pregnancy, first trimester: Secondary | ICD-10-CM | POA: Diagnosis not present

## 2021-12-02 NOTE — Progress Notes (Signed)
Bedside U/S shows single active IUP with FHT of 171 BPM and CRL measuring 29.39mm  GA [redacted]w[redacted]d ?

## 2021-12-02 NOTE — Progress Notes (Signed)
?  ? ?Subjective:  ? ?Julie Wilkinson is a 26 y.o. [id number] at [redacted]w[redacted]d by LMP being seen today for her first obstetrical visit.  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 Patient does intend to breast feed. Pregnancy history fully reviewed. ? ?Patient reports no complaints. ? ?HISTORY: ?OB History  ?Gravida Para Term Preterm AB Living  ?6 2 2  0 2 2  ?SAB IAB Ectopic Multiple Live Births  ?1 1 0 0 2  ?  ?# Outcome Date GA Lbr Len/2nd Weight Sex Delivery Anes PTL Lv  ?6 Current           ?5 Term 09/04/19 [redacted]w[redacted]d   F Vag-Spont   LIV  ?4 IAB 10/06/18          ?3 Term 08/29/14 [redacted]w[redacted]d  7 lb 8 oz (3.402 kg) M Vag-Spont EPI N LIV  ?2 SAB 2015          ?1 Gravida           ? ?Past Medical History:  ?Diagnosis Date  ? Anxiety 2017  ? ?Past Surgical History:  ?Procedure Laterality Date  ? HYMENECTOMY  26 years old  ? ?Family History  ?Problem Relation Age of Onset  ? Bipolar disorder Mother   ? Depression Father   ? Bipolar disorder Sister   ? ?Social History  ? ?Tobacco Use  ? Smoking status: Former  ?  Types: Cigarettes  ?  Quit date: 2015  ?  Years since quitting: 8.3  ? Smokeless tobacco: Never  ?Vaping Use  ? Vaping Use: Never used  ?Substance Use Topics  ? Alcohol use: Not Currently  ? Drug use: No  ? ?Allergies  ?Allergen Reactions  ? Loracarbef Hives  ? ?Current Outpatient Medications on File Prior to Visit  ?Medication Sig Dispense Refill  ? promethazine (PHENERGAN) 25 MG tablet Take 1 tablet (25 mg total) by mouth every 6 (six) hours as needed for nausea or vomiting. 30 tablet 1  ? acetaminophen (TYLENOL) 325 MG tablet Take 2 tablets (650 mg total) by mouth every 4 (four) hours as needed (for pain scale < 4). (Patient not taking: Reported on 08/27/2020) 30 tablet 0  ? ?No  current facility-administered medications on file prior to visit.  ? ?Indications for ASA therapy (per uptodate) ?One of the following: ?Previous pregnancy with preeclampsia, especially early onset and with an adverse outcome No ?Multifetal gestation No ?Chronic hypertension No ?Type 1 or 2 diabetes mellitus No ?Chronic kidney disease No ?Autoimmune disease (antiphospholipid syndrome, systemic lupus erythematosus) No ? ?Two or more of the following: ?Nulliparity No ?Obesity (body mass index >30 kg/m2) No ?Family history of preeclampsia in mother or sister No ?Age ?35 years No ?Sociodemographic characteristics (African American race, low socioeconomic level) No ?Personal risk factors (eg, previous pregnancy with low birth weight or small for gestational age infant, previous adverse pregnancy outcome [eg, stillbirth], interval >10 years between pregnancies) No ? ?Indications for early 1 hour GTT (per uptodate) ? ?BMI >25 (>23 in Asian women) AND one of the following ? ?Gestational diabetes mellitus in a previous pregnancy No ?Glycated hemoglobin ?5.7 percent (39 mmol/mol), impaired glucose tolerance, or impaired fasting glucose on previous testing No ?First-degree relative with diabetes No ?High-risk race/ethnicity (eg, [place] American, Latino, Native American, [place], [place])  No ?History of cardiovascular disease No ?Hypertension or on therapy for hypertension No ?High-density lipoprotein cholesterol level <35 mg/dL (0.90 mmol/L) and/or a triglyceride level >250 mg/dL (2.82 mmol/L) No ?Polycystic ovary syndrome No ?Physical inactivity No ?Other clinical condition associated with insulin resistance (eg, severe obesity, acanthosis nigricans) No ?Previous birth of an infant weighing ?4000 g No ?Previous stillbirth of unknown cause No ?Exam  ? ?Vitals:  ? 12/02/21 0941  ?BP: 108/69  ?Pulse: 89  ?Weight: 126 lb (57.2 kg)  ? ?Fetal Heart Rate (bpm): 170 ? ?Uterus:     ?Pelvic Exam: Perineum: no  hemorrhoids, normal perineum  ? Vulva: normal external genitalia, no lesions  ? Vagina:  normal mucosa, normal discharge  ? Cervix: no lesions and normal, pap smear done.   ? Adnexa: normal adnexa and no mass, fullness, tenderness  ? Bony Pelvis: average  ?System: General: well-developed, well-nourished female in no acute distress  ? Breast:  normal appearance, no masses or tenderness  ? Skin: normal coloration and turgor, no rashes  ? Neurologic: oriented, normal, negative, normal mood  ? Extremities: normal strength, tone, and muscle mass, ROM of all joints is normal  ? HEENT PERRLA, extraocular movement intact and sclera clear, anicteric  ? Mouth/Teeth mucous membranes moist, pharynx normal without lesions and dental hygiene good  ? Neck supple and no masses  ? Cardiovascular: regular rate and rhythm  ? Respiratory:  no respiratory distress, normal breath sounds  ? Abdomen: soft, non-tender; bowel sounds normal; no masses,  no organomegaly  ? ?  ?Assessment:  ? ?Pregnancy: [id number] ?Patient Active Problem List  ? Diagnosis Date Noted  ? Supervision of other normal pregnancy, antepartum 12/02/2021  ? History of mastitis 09/02/2021  ? Bipolar 1 disorder ([place]) 06/07/2019  ? 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 PTSD (post-traumatic stress disorder) 05/17/2017  ? ?  ?Plan:  ?1. Supervision of other normal pregnancy, antepartum ?- New OB. Doing well. No concerns ?- Anticipatory guidance for upcoming appointments provided ?- NIPS at next visit ? ?- Obstetric panel ?- HIV antibody (with reflex) ?- Hepatitis C Antibody ?- Culture, OB Urine ?- Cervicovaginal ancillary only( [place]) ?- Cytology - PAP( [place]) ?- [place] bedside; Future ? ?2. Bipolar disease during pregnancy in first trimester [facility]) ?- No meds or mental health provider. Mood stable.  ? ?- Ambulatory referral to [facility] ? ? ?Initial labs drawn. ?Continue prenatal vitamins. ?Discussed and offered  genetic screening options, including Quad screen/AFP, NIPS testing, and option to decline testing. Benefits/risks/alternatives reviewed. Pt aware that anatomy US is form of genetic screening with lower accuracy in detecting trisomies than blood work.  Pt chooses/declines genetic screening today. NIPS: requested. ?Ultrasound discussed; fetal anatomic survey: requested. ?Problem list reviewed and updated. ?The nature of [place] with multiple MDs and other Advanced Practice Providers was explained to patient; also emphasized that residents, students are part of our team. ?Routine obstetric precautions reviewed. ?Return in about 4 weeks (around 12/30/2021). ? ? ?Renee Harder, CNM ?12/02/21 ?10:20 AM ? ?

## 2021-12-03 LAB — HIV ANTIBODY (ROUTINE TESTING W REFLEX): HIV 1&2 Ab, 4th Generation: NONREACTIVE

## 2021-12-03 LAB — OBSTETRIC PANEL
Absolute Monocytes: 400 cells/uL (ref 200–950)
Antibody Screen: NOT DETECTED
Basophils Absolute: 39 cells/uL (ref 0–200)
Basophils Relative: 0.5 %
Eosinophils Absolute: 31 cells/uL (ref 15–500)
Eosinophils Relative: 0.4 %
HCT: 38.3 % (ref 35.0–45.0)
Hemoglobin: 12.8 g/dL (ref 11.7–15.5)
Hepatitis B Surface Ag: NONREACTIVE
Lymphs Abs: 1378 cells/uL (ref 850–3900)
MCH: 30.2 pg (ref 27.0–33.0)
MCHC: 33.4 g/dL (ref 32.0–36.0)
MCV: 90.3 fL (ref 80.0–100.0)
MPV: 11.2 fL (ref 7.5–12.5)
Monocytes Relative: 5.2 %
Neutro Abs: 5852 cells/uL (ref 1500–7800)
Neutrophils Relative %: 76 %
Platelets: 271 10*3/uL (ref 140–400)
RBC: 4.24 10*6/uL (ref 3.80–5.10)
RDW: 12.9 % (ref 11.0–15.0)
RPR Ser Ql: NONREACTIVE
Rubella: 1.07 Index
Total Lymphocyte: 17.9 %
WBC: 7.7 10*3/uL (ref 3.8–10.8)

## 2021-12-03 LAB — CERVICOVAGINAL ANCILLARY ONLY
Bacterial Vaginitis (gardnerella): POSITIVE — AB
Candida Glabrata: NEGATIVE
Candida Vaginitis: NEGATIVE
Chlamydia: NEGATIVE
Comment: NEGATIVE
Comment: NEGATIVE
Comment: NEGATIVE
Comment: NEGATIVE
Comment: NEGATIVE
Comment: NORMAL
Neisseria Gonorrhea: NEGATIVE
Trichomonas: NEGATIVE

## 2021-12-03 LAB — HEPATITIS C ANTIBODY
Hepatitis C Ab: NONREACTIVE
SIGNAL TO CUT-OFF: 0.04 (ref <1.00)

## 2021-12-04 DIAGNOSIS — R87613 High grade squamous intraepithelial lesion on cytologic smear of cervix (HGSIL): Secondary | ICD-10-CM | POA: Insufficient documentation

## 2021-12-04 LAB — CYTOLOGY - PAP: Diagnosis: HIGH — AB

## 2021-12-04 LAB — URINE CULTURE, OB REFLEX: Organism ID, Bacteria: NO GROWTH

## 2021-12-04 LAB — CULTURE, OB URINE

## 2021-12-08 NOTE — BH Specialist Note (Signed)
Integrated Behavioral Health Initial In-Person Visit ? ?MRN: [id number] ?Name: Julie Wilkinson ? ?Number of Integrated Behavioral Health Clinician visits: 1- Initial Visit ? ?Session Start time: 1405 ?   ?Session End time: 1447 ? ?Total time in minutes: 42 ? ? ?Types of Service: Individual psychotherapy ? ?Interpretor:No. Interpretor Name and Language: n/a ? ? Warm Hand Off Completed. ? ?  ? ?  ? ? ?Subjective: ?Julie Wilkinson is a 26 y.o. female accompanied by  n/a ?Patient was referred by Camelia Eng, CNM for bipolar disorder in pregnancy. ?Patient reports the following symptoms/concerns: Requesting BH medication safe in pregnancy to manage bipolar 1 disorder (prefers no lithium "felt like a zombie"); self-coping strategies minimally helpful. Goal is maintaining emotional wellness in healthy pregnancy.  ?Duration of problem: Ongoing; Severity of problem:  moderately severe ? ?Objective: ?Mood: NA and Affect: Appropriate ?Risk of harm to self or others: No plan to harm self or others ? ?Life Context: ?Family and Social: Pt lives with her 5yo child with family ?School/Work: Working full-time ?Self-Care: Advocating for appropriate medication and healthy sleep ?Life Changes: Current pregnancy and move back into family home ? ?Patient and/or Family's Strengths/Protective Factors: ?Social connections, Concrete supports in place (healthy food, safe environments, etc.), Sense of purpose, and Physical Health (exercise, healthy diet, medication compliance, etc.) ? ?Goals Addressed: ?Patient will: ?Reduce symptoms of: anxiety, depression, mood instability, and stress ?Increase knowledge and/or ability of: healthy habits  ?Demonstrate ability to: Increase healthy adjustment to current life circumstances ? ?Progress towards Goals: ?Ongoing ? ?Interventions: ?Interventions utilized: Psychoeducation and/or Health Education, Link to [employer], and Supportive Reflection  ?Standardized Assessments completed: GAD-7 and PHQ  9 ? ?Patient and/or Family Response: Patient agrees with treatment plan. ? ? ?Patient Centered Plan: ?Patient is on the following Treatment Plan(s):  IBH ? ?Assessment: ?Patient currently experiencing Bipolar 1 disorder and PTSD (both as previously diagnosed). ?  ?Patient may benefit from psychoeducation and brief therapeutic interventions regarding coping with symptoms of mood instability with anxiety, depression; life stress ?. ? ?Plan: ?Follow up with behavioral health clinician on : Two weeks ?Behavioral recommendations:  ?-Continue taking prenatal vitamin daily as recommended by medical provider ?-Continue prioritizing healthy self-care daily (healthy sleeping and routine meals) ?-Consider additional information and resources on After Visit Summary, as needed ?Referral(s): Integrated [employer] (In Clinic) ? ?Valetta Close Tayven Renteria, LCSW ? ? ?  12/18/2021  ?  3:37 PM 12/02/2021  ? 10:24 AM 08/31/2019  ? 11:44 AM 08/22/2019  ? 10:16 AM 08/15/2019  ?  4:32 PM  ?Depression screen PHQ 2/9  ?Decreased Interest 3 2 2 1 1   ?Down, Depressed, Hopeless 3 2 1 1 1   ?PHQ - 2 Score 6 4 3 2 2   ?Altered sleeping 3 1 2 2 1   ?Tired, decreased energy 3 3 3 2 2   ?Change in appetite 2 1 1 1 1   ?Feeling bad or failure about yourself  1 0 0 0 0  ?Trouble concentrating 2 1 2 2 1   ?Moving slowly or fidgety/restless 1 0 0 0 0  ?Suicidal thoughts 0 0 0 0 0  ?PHQ-9 Score 18 10 11 9 7   ?Difficult doing work/chores  Not difficult at all     ? ? ?  12/18/2021  ?  3:38 PM 12/02/2021  ? 10:25 AM 08/31/2019  ? 11:44 AM 08/22/2019  ? 10:17 AM  ?GAD 7 : Generalized Anxiety Score  ?Nervous, Anxious, on Edge 3 1 0 2  ?Control/stop worrying 2 1  1 1  ?Worry too much - different things 3 1 1 1   ?Trouble relaxing 3 1 1 1   ?Restless 1 0 1 1  ?Easily annoyed or irritable 3 1 2 2   ?Afraid - awful might happen 0 0 0 0  ?Total GAD 7 Score 15 5 6 8   ?Anxiety Difficulty  Not difficult at all    ? ? ? ? ? ? ? ? ? ?

## 2021-12-11 ENCOUNTER — Other Ambulatory Visit: Payer: Self-pay

## 2021-12-11 ENCOUNTER — Inpatient Hospital Stay (HOSPITAL_COMMUNITY)
Admission: AD | Admit: 2021-12-11 | Discharge: 2021-12-11 | Disposition: A | Discharge status: Home / Self Care | Payer: Medicaid Other | Attending: Family Medicine | Admitting: Family Medicine

## 2021-12-11 DIAGNOSIS — Z87891 Personal history of nicotine dependence: Secondary | ICD-10-CM | POA: Insufficient documentation

## 2021-12-11 DIAGNOSIS — O208 Other hemorrhage in early pregnancy: Secondary | ICD-10-CM | POA: Insufficient documentation

## 2021-12-11 DIAGNOSIS — Z3A11 11 weeks gestation of pregnancy: Secondary | ICD-10-CM | POA: Diagnosis not present

## 2021-12-11 DIAGNOSIS — O418X10 Other specified disorders of amniotic fluid and membranes, first trimester, not applicable or unspecified: Secondary | ICD-10-CM

## 2021-12-11 DIAGNOSIS — Z3491 Encounter for supervision of normal pregnancy, unspecified, first trimester: Secondary | ICD-10-CM

## 2021-12-11 LAB — URINALYSIS, ROUTINE W REFLEX MICROSCOPIC
Bilirubin Urine: NEGATIVE
Glucose, UA: NEGATIVE mg/dL
Ketones, ur: NEGATIVE mg/dL
Leukocytes,Ua: NEGATIVE
Nitrite: NEGATIVE
Protein, ur: NEGATIVE mg/dL
Specific Gravity, Urine: 1.004 — ABNORMAL LOW (ref 1.005–1.030)
pH: 7 (ref 5.0–8.0)

## 2021-12-11 NOTE — MAU Note (Signed)
Cleone Slim CNM in Triage with bedside u/s. FHTs noted at 144.  Pt reassured. D/C instructions given by Cleone Slim CNM and pt d/c home from Triage.  ?

## 2021-12-11 NOTE — MAU Note (Signed)
.  Julie Wilkinson is a 26 y.o. at [redacted]w[redacted]d here in MAU reporting dark bleeding about an hour ago. Having some period cramps. Hx subchorionic hemorrhage so she was thinking may be from that but wanted to be sure. No recent intercourse ?LMP: [facility] 07/02/2022 ?Onset of complaint 1830 ?Pain score: 6 ?Vitals:  ? 12/11/21 1927 12/11/21 1928  ?BP:  103/68  ?Pulse: 79   ?Resp: 17   ?Temp: 99.2 ?F (37.3 ?C)   ?SpO2: 99%   ?   ?TDV:VOHYWV to hear in Triage ?Lab orders placed from triage:   ? ?

## 2021-12-11 NOTE — MAU Provider Note (Signed)
?History  ?  ? ?CSN: [id number] ? ?Arrival date and time: 12/11/21 1916 ? ? Event Date/Time  ? First Provider Initiated Contact with Patient 12/11/21 1940   ?  ? ?Chief Complaint  ?Patient presents with  ? Vaginal Bleeding  ? ?HPI ?Julie Wilkinson is a 26 y.o. [id number] at [redacted]w[redacted]d who presents stating she had one episode of dark red bleeding tonight. She reports she knows she has a subchorionic hemorrhage and thinks that is what the bleeding is from but wanted to be sure. She denies any current bleeding or pain.  ? ?OB History   ? ? Gravida  ?6  ? Para  ?2  ? Term  ?2  ? Preterm  ?   ? AB  ?2  ? Living  ?2  ?  ? ? SAB  ?1  ? IAB  ?1  ? Ectopic  ?   ? Multiple  ?   ? Live Births  ?2  ?   ?  ?  ? ? ?Past Medical History:  ?Diagnosis Date  ? Anxiety 2017  ? ? ?Past Surgical History:  ?Procedure Laterality Date  ? HYMENECTOMY  26 years old  ? ? ?Family History  ?Problem Relation Age of Onset  ? Bipolar disorder Mother   ? Depression Father   ? Bipolar disorder Sister   ? ? ?Social History  ? ?Tobacco Use  ? Smoking status: Former  ?  Types: Cigarettes  ?  Quit date: 2015  ?  Years since quitting: 8.3  ? Smokeless tobacco: Never  ?Vaping Use  ? Vaping Use: Never used  ?Substance Use Topics  ? Alcohol use: Not Currently  ? Drug use: No  ? ? ?Allergies:  ?Allergies  ?Allergen Reactions  ? Loracarbef Hives  ? ? ?Medications Prior to Admission  ?Medication Sig Dispense Refill Last Dose  ? acetaminophen (TYLENOL) 325 MG tablet Take 2 tablets (650 mg total) by mouth every 4 (four) hours as needed (for pain scale < 4). (Patient not taking: Reported on 08/27/2020) 30 tablet 0   ? promethazine (PHENERGAN) 25 MG tablet Take 1 tablet (25 mg total) by mouth every 6 (six) hours as needed for nausea or vomiting. 30 tablet 1   ? ? ?Review of Systems  ?Constitutional: Negative.  Negative for fatigue and fever.  ?HENT: Negative.    ?Respiratory: Negative.  Negative for shortness of breath.   ?Cardiovascular: Negative.  Negative for chest pain.   ?Gastrointestinal: Negative.  Negative for abdominal pain, constipation, diarrhea, nausea and vomiting.  ?Genitourinary:  Positive for vaginal bleeding. Negative for dysuria and vaginal discharge.  ?Neurological: Negative.  Negative for dizziness and headaches.  ?Physical Exam  ? ?Blood pressure 103/68, pulse 79, temperature 99.2 ?F (37.3 ?C), resp. rate 17, height 5\' 4"  (1.626 m), weight 59.4 kg, last menstrual period 09/25/2021, SpO2 99 %. ? ?Physical Exam ?Vitals and nursing note reviewed.  ?Constitutional:   ?   General: She is not in acute distress. ?   Appearance: She is well-developed.  ?HENT:  ?   Head: Normocephalic.  ?Eyes:  ?   Pupils: Pupils are equal, round, and reactive to light.  ?Cardiovascular:  ?   Rate and Rhythm: Normal rate and regular rhythm.  ?   Heart sounds: Normal heart sounds.  ?Pulmonary:  ?   Effort: Pulmonary effort is normal. No respiratory distress.  ?   Breath sounds: Normal breath sounds.  ?Abdominal:  ?   General: Bowel sounds are normal. There  is no distension.  ?   Palpations: Abdomen is soft.  ?   Tenderness: There is no abdominal tenderness.  ?Skin: ?   General: Skin is warm and dry.  ?Neurological:  ?   Mental Status: She is alert and oriented to person, place, and time.  ?Psychiatric:     ?   Mood and Affect: Mood normal.     ?   Behavior: Behavior normal.     ?   Thought Content: Thought content normal.     ?   Judgment: Judgment normal.  ? ? ?MAU Course  ?Procedures ? ?MDM ?Pt informed that the ultrasound is considered a limited OB ultrasound and is not intended to be a complete ultrasound exam.  Patient also informed that the ultrasound is not being completed with the intent of assessing for fetal or placental anomalies or any pelvic abnormalities.  Explained that the purpose of today?s ultrasound is to assess for viability.  Patient acknowledges the purpose of the exam and the limitations of the study.    ? ?Viable fetus with FHR 144 bpm visualized ? ?Reassurance  provided and expectations for subchorionic hemorrhages reviewed at length. ? ?Assessment and Plan  ? ?1. Normal intrauterine pregnancy on prenatal ultrasound in first trimester   ?2. [redacted] weeks gestation of pregnancy   ?3. Subchorionic hemorrhage of placenta in first trimester, single or unspecified fetus   ? ?-Discharge home in stable condition ?-Vaginal bleeding precautions discussed ?-Patient advised to follow-up with OB as scheduled for prenatal care ?-Patient may return to MAU as needed or if her condition were to change or worsen ? ? ?Rolm Bookbinder CNM ?12/11/2021, 7:40 PM  ?

## 2021-12-11 NOTE — Discharge Instructions (Signed)

## 2021-12-18 ENCOUNTER — Ambulatory Visit (INDEPENDENT_AMBULATORY_CARE_PROVIDER_SITE_OTHER): Payer: Medicaid Other | Admitting: Clinical

## 2021-12-18 DIAGNOSIS — F319 Bipolar disorder, unspecified: Secondary | ICD-10-CM

## 2021-12-18 DIAGNOSIS — F4310 Post-traumatic stress disorder, unspecified: Secondary | ICD-10-CM

## 2021-12-18 NOTE — Patient Instructions (Addendum)
Center for [employer] [facility] ?[place] ?[place], [place] [address] ?[phone] (main office) ?[phone] (Sinan Tuch's office) ? ?[facility]: ?- [facility] (24/7): [phone] ?  [address] Rondall Allegra, [place] [address] ?- [place]: [phone] ? ?[email or web address]  ? ?www.[email or web address].net ? ? ?/Emotional Higher education careers adviser and Websites ?Here are a few free apps meant to help you to help yourself.  To find, try searching on the internet to see if the app is offered on Apple/Android devices. If your first choice doesn't come up on your device, the good news is that there are many choices! Play around with different apps to see which ones are helpful to you. ? ? ? Calm ?This is an app meant to help increase calm feelings. Includes info, strategies, and tools for tracking your feelings. ?  ? ? ? Calm Harm  ?This app is meant to help with self-harm. Provides many 5-minute or 15-min coping strategies for doing instead of hurting yourself.  ?  ? ? ? Healthy Minds ?Health Minds is a problem-solving tool to help deal with emotions and cope with stress you encounter wherever you are. ?  ? ? ? MindShift ?This app can help people cope with anxiety. Rather than trying to avoid anxiety, you can make an important shift and face it. ?  ? ? ? MY3  ?MY3 features a support system, safety plan and resources with the goal of offering a tool to use in a time of need. ? ?  ? ? ? My Life My Voice  ?This mood journal offers a simple solution for tracking your thoughts, feelings and moods. Animated emoticons can help identify your mood. ? ?  ? ? ? Relax Melodies ?Designed to help with sleep, on this app you can mix sounds and meditations for relaxation. ?  ? ? ? Smiling Mind ?Smiling Mind is meditation made easy: it's a simple tool that helps put a smile on your mind. ?  ? ? ? ? ? Stop, Breathe & Think  ?A friendly, simple guide for people through  meditations for mindfulness and compassion. ? ?Stop, Breathe and Think Kids ?Enter your current feelings and choose a ?mission? to help you cope. Offers videos for certain moods instead of just sound recordings.  ?  ? ? ? Team Orange ?The goal of this tool is to help teens change how they think, act, and react. This app helps you focus on your own good feelings and experiences. ?  ? ? ? The Ashland Box ?The [place] Box (VHB) contains simple tools to help patients with coping, relaxation, distraction, and positive thinking. ?  ? ? ? ? ?BRAINSTORMING ? ?Develop a Plan ?Goals: ?Provide a way to start conversation about your new life with a baby ?Assist parents in recognizing and using resources within their reach ?Help pave the way before birth for an easier period of transition afterwards. ? ?Make a list of the following information to keep in a central location: ?Full name of Mom and Partner: _____________________________________________ ?61 full name and Date of Birth: ___________________________________________ ?Home Address: ___________________________________________________________ ?________________________________________________________________________ ?Home Phone: ____________________________________________________________ ?Parents' cell numbers: _____________________________________________________ ?________________________________________________________________________ ?Name and contact info for OB: ______________________________________________ ?Name and contact info for Pediatrician:________________________________________ ?Contact info for Lactation Consultants: ________________________________________ ? ?REST and SLEEP ?*You each need at least 4-5 hours of uninterrupted sleep every day. Write specific names and contact information.* ?How are you going to rest in the  postpartum period? While partner's home? When partner returns to work? When you both return to work? ?Where will your baby  sleep? ?Who is available to help during the day? Evening? Night? ?Who could move in for a period to help support you? ?What are some ideas to help you get enough sleep? ?__________________________________________________________________________________________________________________________________________________________________________________________________________________________________________ ?NUTRITIOUS FOOD AND DRINK ?*Plan for meals before your baby is born so you can have healthy food to eat during the immediate postpartum period.* ?Who will look after breakfast? Lunch? Dinner? List names and contact information. Brainstorm quick, healthy ideas for each meal. ?What can you do before baby is born to prepare meals for the postpartum period? ?How can others help you with meals? ?Which grocery stores provide online shopping and delivery? ?Which restaurants offer take-out or delivery options? ?______________________________________________________________________________________________________________________________________________________________________________________________________________________________________________________________________________________________________________________________________________________________________________________________________ ? ?CARE FOR MOM ?*It's important that mom is cared for and pampered in the postpartum period. Remember, the most important ways new mothers need care are: sleep, nutrition, gentle exercise, and time off.* ?Who can come take care of mom during this period? Make a list of people with their contact information. ?List some activities that make you feel cared for, rested, and energized? Who can make sure you have opportunities to do these things? ?Does mom have a space of her very own within your home that's just for her? Make a ?[facility]? where she can be comfortable, rest, and renew herself  daily. ?______________________________________________________________________________________________________________________________________________________________________________________________________________________________________________________________________________________________________________________________________________________________________________________________________ ? ? ? ?CARE FOR AND FEEDING BABY ?*Knowledgeable and encouraging people will offer the best support with regard to feeding your baby.* ?Educate yourself and choose the best feeding option for your baby. ?Make a list of people who will guide, support, and be a resource for you as your care for and feed your baby. (Friends that have breastfed or are currently breastfeeding, lactation consultants, breastfeeding support groups, etc.) ?Consider a postpartum doula. (These websites can give you information: dona.org & BuyingShow.es) ?Seek out local breastfeeding resources like the breastfeeding support group at [employer] or [employer]. ?______________________________________________________________________________________________________________________________________________________________________________________________________________________________________________________________________________________________________________________________________________________________________________________________________ ? ?CHORES AND ERRANDS ?Who can help with a thorough cleaning before baby is born? ?Make a list of people who will help with housekeeping and chores, like laundry, light cleaning, dishes, bathrooms, etc. ?Who can run some errands for you? ?What can you do to make sure you are stocked with basic supplies before baby is born? ?Who is going to do the  shopping? ?______________________________________________________________________________________________________________________________________________________________________________________________________________________________________________________________________________________________________________________________________________________________________________________________________ ? ? ? ? ?Family Adjustm

## 2021-12-22 NOTE — BH Specialist Note (Signed)
Pt did not arrive to video visit and did not answer the phone; Unable to reach by phone as "cannot be reached at this time"; left MyChart message for patient.

## 2021-12-30 ENCOUNTER — Ambulatory Visit (INDEPENDENT_AMBULATORY_CARE_PROVIDER_SITE_OTHER): Payer: Medicaid Other | Admitting: Obstetrics and Gynecology

## 2021-12-30 VITALS — BP 103/67 | HR 80 | Wt 131.0 lb

## 2021-12-30 DIAGNOSIS — F319 Bipolar disorder, unspecified: Secondary | ICD-10-CM

## 2021-12-30 DIAGNOSIS — Z3480 Encounter for supervision of other normal pregnancy, unspecified trimester: Secondary | ICD-10-CM

## 2021-12-30 DIAGNOSIS — Z3402 Encounter for supervision of normal first pregnancy, second trimester: Secondary | ICD-10-CM | POA: Diagnosis not present

## 2021-12-30 DIAGNOSIS — Z31430 Encounter of female for testing for genetic disease carrier status for procreative management: Secondary | ICD-10-CM | POA: Diagnosis not present

## 2021-12-30 NOTE — Progress Notes (Unsigned)
   PRENATAL VISIT NOTE  Subjective:  Julie Wilkinson is a 26 y.o. [id number] at [redacted]w[redacted]d being seen today for ongoing prenatal care.  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  Patient reports no complaints.  Contractions: Not present. Vag. Bleeding: None.  Movement: Absent. Denies leaking of fluid.   The following portions of the patient's history were reviewed and updated as appropriate: allergies, current medications, past family history, past medical history, past social history, past surgical history and problem list.   Objective:   Vitals:   12/30/21 1328  BP: 103/67  Pulse: 80  Weight: 131 lb (59.4 kg)    Fetal Status: Fetal Heart Rate (bpm): 163   Movement: Absent     General:  Alert, oriented and cooperative. Patient is in no acute distress.  Skin: Skin is warm and dry. No rash noted.   Cardiovascular: Normal heart rate noted  Respiratory: Normal respiratory effort, no problems with respiration noted  Abdomen: Soft, gravid, appropriate for gestational age.  Pain/Pressure: Absent     Pelvic: Cervical exam deferred        Extremities: Normal range of motion.  Edema: None  Mental Status: Normal mood and affect. Normal behavior. Normal judgment and thought content.   Assessment and Plan:  Pregnancy: [id number] at [redacted]w[redacted]d  1. Supervision of other normal pregnancy, antepartum  - Genetic Screening - US MFM OB COMP + 14 WK; Future  2. Bipolar 1 disorder ([place])  - Used monarch in the past, did not have a good experience - Used Lithium with last pregnancy and did not like the way she feels. She is interested in starting something and has considered  Seroquel. She reports an increase in bipolar symptoms with ups and downs. She denies thoughts of harm to herself or others.  - Message sent to [facility] with integrated health to help facilitate resources and referrals. She is schedule with them 01/01/2022   Preterm labor symptoms and general obstetric precautions including but not limited to vaginal bleeding, contractions, leaking of fluid and fetal movement were reviewed in detail with the patient. Please refer to After Visit Summary for other counseling recommendations.   No follow-ups on file.  Future Appointments  Date Time Provider [place]  01/01/2022  1:15 PM [facility]  01/27/2022  1:30 PM Renee Harder, CNM [facility]  02/05/2022  9:30 AM [facility] US2 [facility] [place], NP

## 2022-01-01 ENCOUNTER — Ambulatory Visit: Payer: Medicaid Other | Admitting: Clinical

## 2022-01-01 DIAGNOSIS — Z91199 Patient's noncompliance with other medical treatment and regimen due to unspecified reason: Secondary | ICD-10-CM

## 2022-01-01 NOTE — Patient Instructions (Signed)
Center for [facility] at [facility] [place], [place] [address] [phone] (main office) [phone] Nurse, mental health office)  24/7 [place] by [place]:  [place] (24/7): [phone]   [address] [place], [place] [address] - 24 Hour Crisis Line: 1 [phone]  [facility]  [address] [place], [place] [address] [phone] or [phone] [facility] 24/7 [place] ANYONE [address], [place], [place] Fax: [phone] [email or web address] *Interpreters available *Accepts all insurance and uninsured for Urgent Care needs *Accepts Medicaid and uninsured for outpatient treatment

## 2022-01-09 ENCOUNTER — Encounter: Payer: Self-pay | Admitting: *Deleted

## 2022-01-09 DIAGNOSIS — Z3480 Encounter for supervision of other normal pregnancy, unspecified trimester: Secondary | ICD-10-CM

## 2022-01-09 NOTE — Progress Notes (Signed)
Horizons scanned into chart and routed to J Rasch,NP

## 2022-01-23 ENCOUNTER — Encounter: Payer: Self-pay | Admitting: Certified Nurse Midwife

## 2022-01-23 ENCOUNTER — Ambulatory Visit (INDEPENDENT_AMBULATORY_CARE_PROVIDER_SITE_OTHER): Payer: Medicaid Other | Admitting: Certified Nurse Midwife

## 2022-01-23 VITALS — BP 100/65 | HR 77 | Temp 98.6°F | Wt 134.0 lb

## 2022-01-23 DIAGNOSIS — R399 Unspecified symptoms and signs involving the genitourinary system: Secondary | ICD-10-CM

## 2022-01-23 DIAGNOSIS — Z3480 Encounter for supervision of other normal pregnancy, unspecified trimester: Secondary | ICD-10-CM

## 2022-01-23 DIAGNOSIS — Z3A17 17 weeks gestation of pregnancy: Secondary | ICD-10-CM | POA: Diagnosis not present

## 2022-01-23 DIAGNOSIS — R87613 High grade squamous intraepithelial lesion on cytologic smear of cervix (HGSIL): Secondary | ICD-10-CM

## 2022-01-23 LAB — POCT URINALYSIS DIPSTICK
Appearance: NORMAL
Bilirubin, UA: NEGATIVE
Blood, UA: NEGATIVE
Glucose, UA: NEGATIVE
Ketones, UA: NEGATIVE
Nitrite, UA: NEGATIVE
Protein, UA: NEGATIVE
Spec Grav, UA: 1.01 (ref 1.010–1.025)
Urobilinogen, UA: NEGATIVE E.U./dL — AB
pH, UA: 7 (ref 5.0–8.0)

## 2022-01-23 MED ORDER — CEFADROXIL 500 MG PO CAPS: 500.0000 mg | ORAL_CAPSULE | Freq: Two times a day (BID) | ORAL | 0 refills | Status: DC

## 2022-01-23 NOTE — Progress Notes (Signed)
Neg urine POC

## 2022-01-23 NOTE — Progress Notes (Signed)
Subjective:  Lorel Lembo is a 26 y.o. [id number] at [redacted]w[redacted]d being seen today for ongoing prenatal care.  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  Patient reports  3 days of dysuria, urgency, frequency, and right lower back/flank pain; denies hematuria or fever.  Contractions: Not present. Vag. Bleeding: None.  Movement: Absent. Denies leaking of fluid.   The following portions of the patient's history were reviewed and updated as appropriate: allergies, current medications, past family history, past medical history, past social history, past surgical history and problem list. Problem list updated.  Objective:   Vitals:   01/23/22 0926  BP: 100/65  Pulse: 77  Temp: 98.6 F (37 C)  Weight: 134 lb (60.8 kg)    Fetal Status: Fetal Heart Rate (bpm): 151 Fundal Height: 17 cm Movement: Absent     General:  Alert, oriented and cooperative. Patient is in no acute distress.  Skin: Skin is warm and dry. No rash noted.   Cardiovascular: Normal heart rate noted  Respiratory: Normal respiratory effort, no problems with respiration noted  Abdomen: Soft, gravid, appropriate for gestational age. Pain/Pressure: Absent   no CVAT  Pelvic: Vag. Bleeding: None Vag D/C Character: Thin   Cervical exam deferred        Extremities: Normal range of motion.  Edema: None  Mental Status: Normal mood and affect. Normal behavior. Normal judgment and thought content.   Urinalysis:    negative  Assessment and Plan:  Pregnancy: [id number] at [redacted]w[redacted]d  1. UTI symptoms - Culture, OB Urine - POCT Urinalysis Dipstick - cefadroxil (DURICEF) 500 MG capsule; Take 1 capsule (500 mg total) by  mouth 2 (two) times daily.  Dispense: 14 capsule; Refill: 0  2. [redacted] weeks gestation of pregnancy - Alpha fetoprotein, maternal  3. Supervision of other normal pregnancy, antepartum -anatomy US scheduled  4. HSIL (high grade squamous intraepithelial lesion) on Pap smear of cervix -informed previously about pap results and need for colpo, all questions answered - schedule with MD  Preterm labor symptoms and general obstetric precautions including but not limited to vaginal bleeding, contractions, leaking of fluid and fetal movement were reviewed in detail with the patient. Please refer to After Visit Summary for other counseling recommendations.  Return in about 3 weeks (around 02/13/2022).   Donette Larry, CNM

## 2022-01-25 LAB — CULTURE, OB URINE

## 2022-01-25 LAB — URINE CULTURE, OB REFLEX

## 2022-01-26 LAB — TIQ-AOE

## 2022-01-28 LAB — ALPHA FETOPROTEIN, MATERNAL
AFP MoM: 0.77
AFP, Serum: 32.7 ng/mL
Calc'd Gestational Age: 17.1 weeks
Maternal Wt: 134 [lb_av]
Risk for ONTD: <1
Twins-AFP: 1

## 2022-02-05 ENCOUNTER — Other Ambulatory Visit: Payer: Self-pay | Admitting: *Deleted

## 2022-02-05 ENCOUNTER — Other Ambulatory Visit: Payer: Self-pay | Admitting: Obstetrics and Gynecology

## 2022-02-05 ENCOUNTER — Ambulatory Visit: Payer: Medicaid Other

## 2022-02-05 ENCOUNTER — Ambulatory Visit: Payer: Medicaid Other | Attending: Obstetrics and Gynecology | Admitting: Maternal & Fetal Medicine

## 2022-02-05 ENCOUNTER — Ambulatory Visit: Payer: Medicaid Other | Attending: Obstetrics and Gynecology

## 2022-02-05 DIAGNOSIS — Z3480 Encounter for supervision of other normal pregnancy, unspecified trimester: Secondary | ICD-10-CM | POA: Insufficient documentation

## 2022-02-05 DIAGNOSIS — O35DXX1 Maternal care for other (suspected) fetal abnormality and damage, fetal gastrointestinal anomalies, fetus 1: Secondary | ICD-10-CM

## 2022-02-05 DIAGNOSIS — Z3A19 19 weeks gestation of pregnancy: Secondary | ICD-10-CM | POA: Diagnosis not present

## 2022-02-05 DIAGNOSIS — O280 Abnormal hematological finding on antenatal screening of mother: Secondary | ICD-10-CM | POA: Diagnosis not present

## 2022-02-05 DIAGNOSIS — Z363 Encounter for antenatal screening for malformations: Secondary | ICD-10-CM | POA: Diagnosis not present

## 2022-02-05 DIAGNOSIS — O358XX0 Maternal care for other (suspected) fetal abnormality and damage, not applicable or unspecified: Secondary | ICD-10-CM | POA: Diagnosis not present

## 2022-02-05 DIAGNOSIS — Z148 Genetic carrier of other disease: Secondary | ICD-10-CM | POA: Insufficient documentation

## 2022-02-05 DIAGNOSIS — O99342 Other mental disorders complicating pregnancy, second trimester: Secondary | ICD-10-CM | POA: Diagnosis not present

## 2022-02-05 DIAGNOSIS — O283 Abnormal ultrasonic finding on antenatal screening of mother: Secondary | ICD-10-CM

## 2022-02-05 NOTE — Progress Notes (Unsigned)
cmv

## 2022-02-05 NOTE — Progress Notes (Signed)
MFM Brief consultation  Julie Wilkinson is a G6P2 at 19 weeks with an EDD of 07/02/22. SHe is seen at the request of Julie Carbon, NP  Single intrauterine pregnancy here for a detailed anatomy due echogenic bowel Normal anatomy with measurements consistent with dates There is good fetal movement and amniotic fluid volume  We observed echogenic bowel and echogenic intracardiac focus.   Echogenic abdominal lesions are areas of abnormal brightness in the fetal abdomen, with echogenicity similar to that of surrounding bone, and producing various amounts of acoustic shadowing. Echogenic bowel (approximately 1% of second-trimester pregnancies) results from excessively thick meconium caused by an aperistaltic small bowel. Swallowed blood can also produce an echogenic appearance (Julie Wilkinson reports have a recently resolved subchorionic hemorrhage which was not seen today).   Echogenic bowel is associated with aneuploidy, TORCH (toxoplasmosis, other [e.g., syphilis, parvovirus B19, varicella], rubella, cytomegalovirus [CMV], herpesvirus) infection, meconium ileus, cystic fibrosis, placental hemorrhage, ischemia, or can be a normal variant. Approximately 50% of fetuses with echogenic bowel have other structural abnormalities or fetal growth restriction (or both). Aneuploidy (trisomy 13, 18, 21, triploidy) is present in up to 10%.; Cystic fibrosis is present in 13%-40% of patients with meconium ileus and TORCH infections are present in 10%.   Approximately 50% of cases of isolated echogenic bowel resolve spontaneously prenatally.   At this time we recommend serum screening, cystic fibrosis screeing, serologies for toxoplasmosis and CMV and serial ultrasounds for fetal growth, amniotic fluid and worsening fetal condition (ie hydrops fetalis). Assuming no chromosome defects, cystic fibrosis, infection, growth restriction, or other associated anatomic abnormalities, the prognosis is good for normal outcome.  I discussed  the option of screening vs diagnostic studies and the risk and benefits of both options. At this time Julie Wilkinson opted  out of invasive testing , as the diagnosis would not change her care for the pregnancy.  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  An echogenic intracardiac focus was observed today. I discussed that there is no increased risk to the function or structure of the heart. In addition, in the context of a low risk NIPS and neg horizon result the risk for aneuploidy are reduced. There were no additional markers of aneuploidy observed.  I reviewed that an ultrasound is a screening exam and that a diagnostic exam via amnioticenetesis is the only test available to provide a definitive result.  Repeat growth is scheduled in 4 weeks.   All questions were answered.   I spent 30 minute with > 50% in face to face consultation.   Julie Olive, Julie Wilkinson

## 2022-02-06 LAB — PARVOVIRUS B19 ANTIBODY, IGG AND IGM
Parvovirus B19 IgG: 5 index — ABNORMAL HIGH (ref 0.0–0.8)
Parvovirus B19 IgM: 0.2 index (ref 0.0–0.8)

## 2022-02-06 LAB — TOXOPLASMA GONDII ANTIBODY, IGG: Toxoplasma IgG Ratio: <3 IU/mL (ref 0.0–7.1)

## 2022-02-06 LAB — CMV ANTIBODY, IGG (EIA): CMV Ab - IgG: <0.6 U/mL (ref 0.00–0.59)

## 2022-02-06 LAB — CMV IGM: CMV IgM Ser EIA-aCnc: <30 AU/mL (ref 0.0–29.9)

## 2022-02-06 LAB — TOXOPLASMA GONDII ANTIBODY, IGM: Toxoplasma Antibody- IgM: <3 AU/mL (ref 0.0–7.9)

## 2022-02-06 LAB — INFECT DISEASE AB IGM REFLEX 1

## 2022-02-16 ENCOUNTER — Encounter: Payer: Medicaid Other | Admitting: Obstetrics & Gynecology

## 2022-02-23 ENCOUNTER — Encounter: Payer: Medicaid Other | Admitting: Obstetrics and Gynecology

## 2022-02-23 DIAGNOSIS — O283 Abnormal ultrasonic finding on antenatal screening of mother: Secondary | ICD-10-CM | POA: Insufficient documentation

## 2022-02-23 NOTE — Progress Notes (Deleted)
   PRENATAL VISIT NOTE  Subjective:  Julie Wilkinson is a 26 y.o. [id number] at [redacted]w[redacted]d being seen today for ongoing prenatal care.  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  Patient reports {sx:14538}.   .  .   . Denies leaking of fluid.   The following portions of the patient's history were reviewed and updated as appropriate: allergies, current medications, past family history, past medical history, past social history, past surgical history and problem list.   Objective:  There were no vitals filed for this visit.  Fetal Status:           General:  Alert, oriented and cooperative. Patient is in no acute distress.  Skin: Skin is warm and dry. No rash noted.   Cardiovascular: Normal heart rate noted  Respiratory: Normal respiratory effort, no problems with respiration noted  Abdomen: Soft, gravid, appropriate for gestational age.        Pelvic: Cervical exam performed in the presence of a chaperone        Extremities: Normal range of motion.     Mental Status: Normal mood and affect. Normal behavior. Normal judgment and thought content.   GYNECOLOGY OFFICE COLPOSCOPY PROCEDURE NOTE  26 y.o. 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   Pregnancy test:  not indicated Gardasil:  {Blank single:19197::"completed","not yet had, declines","not yet had, accepts"}. Discussed role for HPV in cervical dysplasia, need for surveillance.  Informed consent and review of risks, benefit and alternatives performed. Written consent given.    Speculum inserted into patient's vagina assuring full view of cervix and vaginal walls. 3 swabs of vinegar solution applied to the cervix and vaginal walls and colposcope was used to observe both the cervix and vaginal walls.   Colposcopy adequate? {yes/no:20286}  {Findings; colposcopy:728}; corresponding biopsies obtained.   All specimens were labeled and sent to pathology.  Monsel's applied to biopsy sites for good hemostasis and speculum removed.  Pt tolerated well with minimal pain and bleeding.   Patient was given post procedure instructions.  Will follow up pathology and manage accordingly; patient will be contacted with results and recommendations.  Routine preventative health maintenance measures emphasized.  Assessment and Plan:  Pregnancy: [id number] at [redacted]w[redacted]d 1. HSIL (high grade squamous intraepithelial lesion) on Pap smear of cervix ***  2. Supervision of other normal pregnancy, antepartum ***  Preterm labor symptoms and general obstetric precautions including but not limited to vaginal bleeding, contractions, leaking of fluid and fetal movement were reviewed in detail with the patient. Please refer to After Visit Summary for other counseling recommendations.   No follow-ups on file.  Future Appointments  Date Time Provider Department Center  02/23/2022  1:30 PM Milas Hock, MD [facility]  03/05/2022  1:30 PM [facility] NURSE [facility]  03/05/2022  1:45 PM [facility] US6 [facility]    Milas Hock, MD

## 2022-02-26 ENCOUNTER — Inpatient Hospital Stay (HOSPITAL_COMMUNITY)
Admission: AD | Admit: 2022-02-26 | Discharge: 2022-02-26 | Disposition: A | Discharge status: Home / Self Care | Payer: Medicaid Other | Attending: Obstetrics & Gynecology | Admitting: Obstetrics & Gynecology

## 2022-02-26 ENCOUNTER — Other Ambulatory Visit: Payer: Self-pay

## 2022-02-26 ENCOUNTER — Encounter (HOSPITAL_COMMUNITY): Payer: Self-pay | Admitting: Obstetrics & Gynecology

## 2022-02-26 DIAGNOSIS — M549 Dorsalgia, unspecified: Secondary | ICD-10-CM | POA: Insufficient documentation

## 2022-02-26 DIAGNOSIS — O26892 Other specified pregnancy related conditions, second trimester: Secondary | ICD-10-CM | POA: Insufficient documentation

## 2022-02-26 DIAGNOSIS — Z20822 Contact with and (suspected) exposure to covid-19: Secondary | ICD-10-CM | POA: Insufficient documentation

## 2022-02-26 DIAGNOSIS — R519 Headache, unspecified: Secondary | ICD-10-CM | POA: Insufficient documentation

## 2022-02-26 DIAGNOSIS — R509 Fever, unspecified: Secondary | ICD-10-CM

## 2022-02-26 DIAGNOSIS — Z3A22 22 weeks gestation of pregnancy: Secondary | ICD-10-CM

## 2022-02-26 DIAGNOSIS — B349 Viral infection, unspecified: Secondary | ICD-10-CM

## 2022-02-26 LAB — CBC WITH DIFFERENTIAL/PLATELET
Abs Immature Granulocytes: 0.06 10*3/uL (ref 0.00–0.07)
Basophils Absolute: 0 10*3/uL (ref 0.0–0.1)
Basophils Relative: 0 %
Eosinophils Absolute: 0 10*3/uL (ref 0.0–0.5)
Eosinophils Relative: 0 %
HCT: 30.6 % — ABNORMAL LOW (ref 36.0–46.0)
Hemoglobin: 10.4 g/dL — ABNORMAL LOW (ref 12.0–15.0)
Immature Granulocytes: 1 %
Lymphocytes Relative: 7 %
Lymphs Abs: 0.8 10*3/uL (ref 0.7–4.0)
MCH: 31.4 pg (ref 26.0–34.0)
MCHC: 34 g/dL (ref 30.0–36.0)
MCV: 92.4 fL (ref 80.0–100.0)
Monocytes Absolute: 0.6 10*3/uL (ref 0.1–1.0)
Monocytes Relative: 6 %
Neutro Abs: 8.7 10*3/uL — ABNORMAL HIGH (ref 1.7–7.7)
Neutrophils Relative %: 86 %
Platelets: 192 10*3/uL (ref 150–400)
RBC: 3.31 MIL/uL — ABNORMAL LOW (ref 3.87–5.11)
RDW: 12.8 % (ref 11.5–15.5)
WBC: 10.2 10*3/uL (ref 4.0–10.5)
nRBC: 0 % (ref 0.0–0.2)

## 2022-02-26 LAB — RESPIRATORY PANEL BY PCR

## 2022-02-26 LAB — COMPREHENSIVE METABOLIC PANEL
ALT: 16 U/L (ref 0–44)
AST: 25 U/L (ref 15–41)
Albumin: 2.8 g/dL — ABNORMAL LOW (ref 3.5–5.0)
Alkaline Phosphatase: 60 U/L (ref 38–126)
Anion gap: 13 (ref 5–15)
BUN: <5 mg/dL — ABNORMAL LOW (ref 6–20)
CO2: 21 mmol/L — ABNORMAL LOW (ref 22–32)
Calcium: 8.9 mg/dL (ref 8.9–10.3)
Chloride: 105 mmol/L (ref 98–111)
Creatinine, Ser: 0.6 mg/dL (ref 0.44–1.00)
GFR, Estimated: >60 mL/min (ref >60)
Glucose, Bld: 103 mg/dL — ABNORMAL HIGH (ref 70–99)
Potassium: 3.6 mmol/L (ref 3.5–5.1)
Sodium: 139 mmol/L (ref 135–145)
Total Bilirubin: 0.5 mg/dL (ref 0.3–1.2)
Total Protein: 5.9 g/dL — ABNORMAL LOW (ref 6.5–8.1)

## 2022-02-26 LAB — URINALYSIS, ROUTINE W REFLEX MICROSCOPIC
Bilirubin Urine: NEGATIVE
Glucose, UA: NEGATIVE mg/dL
Hgb urine dipstick: NEGATIVE
Ketones, ur: NEGATIVE mg/dL
Nitrite: NEGATIVE
Protein, ur: NEGATIVE mg/dL
Specific Gravity, Urine: 1.004 — ABNORMAL LOW (ref 1.005–1.030)
pH: 8 (ref 5.0–8.0)

## 2022-02-26 LAB — RESP PANEL BY RT-PCR (FLU A&B, COVID) ARPGX2
Influenza A by PCR: NEGATIVE
Influenza B by PCR: NEGATIVE
SARS Coronavirus 2 by RT PCR: NEGATIVE

## 2022-02-26 LAB — WET PREP, GENITAL
Clue Cells Wet Prep HPF POC: NONE SEEN
Sperm: NONE SEEN
Trich, Wet Prep: NONE SEEN
WBC, Wet Prep HPF POC: >=10 — AB (ref <10)
Yeast Wet Prep HPF POC: NONE SEEN

## 2022-02-26 LAB — GROUP A STREP BY PCR: Group A Strep by PCR: NOT DETECTED

## 2022-02-26 MED ORDER — LACTATED RINGERS IV BOLUS
1000.0000 mL | Freq: Once | INTRAVENOUS | Status: AC
  Administered 2022-02-26: 1000 mL via INTRAVENOUS

## 2022-02-26 NOTE — MAU Note (Signed)
Notified [facility] of add on Resp Panel.

## 2022-02-26 NOTE — MAU Note (Signed)
..  Julie Wilkinson is a 26 y.o. at [redacted]w[redacted]d here in MAU reporting: a fever of 101 that began at 3am, she took tylenol around 5am. Also reports lower back pain and lower abdominal pain.  Reports urinary frequency. She also feels like her heart is beating kind of fast.  Denies vaginal bleeding or leaking of fluid.   Pain score: 3/10 Vitals:   02/26/22 0619  BP: 122/72  Pulse: (!) 127  Resp: 17  Temp: 98 F (36.7 C)  SpO2: 98%     FHT:170 by doppler Lab orders placed from triage: UA

## 2022-02-26 NOTE — MAU Provider Note (Addendum)
Chief Complaint: Back Pain and Fever   Event Date/Time   First Provider Initiated Contact with Patient 02/26/22 [phone]      SUBJECTIVE HPI: Julie Wilkinson is a 26 y.o. [id number] at [redacted]w[redacted]d by LMP who presents to maternity admissions reporting back pain and headache x 2-3 days and urinary frequency followed by new onset fever at 3 am this morning of 101.  She took Tylenol around 5 am.  She is also feeling like her heart is beating fast.  She denies any respiratory symptoms or known sick contacts. She denies abdominal cramping, vaginal bleeding or n/v.    HPI  Past Medical History:  Diagnosis Date   Anxiety 2017   Past Surgical History:  Procedure Laterality Date   HYMENECTOMY  26 years old   Social History   Socioeconomic History   Marital status: Single    Spouse name: Not on file   Number of children: Not on file   Years of education: Not on file   Highest education level: Not on file  Occupational History   Not on file  Tobacco Use   Smoking status: Former    Types: Cigarettes    Quit date: 2015    Years since quitting: 8.5   Smokeless tobacco: Never  Vaping Use   Vaping Use: Never used  Substance and Sexual Activity   Alcohol use: Not Currently   Drug use: No   Sexual activity: Yes    Birth control/protection: None  Other Topics Concern   Not on file  Social History Narrative   Not on file   Social Determinants of Health   Financial Resource Strain: Not on file  Food Insecurity: No Food Insecurity (08/31/2019)   Hunger Vital Sign    Worried About Running Out of Food in the Last Year: Never true    Ran Out of Food in the Last Year: Never true  Transportation Needs: No Transportation Needs (08/31/2019)   PRAPARE - Administrator, Civil Service (Medical): No    Lack of Transportation (Non-Medical): No  Physical Activity: Not on file  Stress: Not on file  Social Connections: Not on file  Intimate Partner Violence: Not on file   No current  facility-administered medications on file prior to encounter.   Current Outpatient Medications on File Prior to Encounter  Medication Sig Dispense Refill   acetaminophen (TYLENOL) 325 MG tablet Take 2 tablets (650 mg total) by mouth every 4 (four) hours as needed (for pain scale < 4). 30 tablet 0   promethazine (PHENERGAN) 25 MG tablet Take 1 tablet (25 mg total) by mouth every 6 (six) hours as needed for nausea or vomiting. 30 tablet 1   cefadroxil (DURICEF) 500 MG capsule Take 1 capsule (500 mg total) by mouth 2 (two) times daily. 14 capsule 0   Allergies  Allergen Reactions   Loracarbef Hives    ROS:  Review of Systems  Constitutional:  Positive for fever. Negative for chills and fatigue.  Respiratory:  Negative for shortness of breath.   Cardiovascular:  Negative for chest pain.  Genitourinary:  Positive for frequency. Negative for difficulty urinating, dysuria, flank pain, pelvic pain, vaginal bleeding, vaginal discharge and vaginal pain.  Musculoskeletal:  Positive for back pain.  Neurological:  Positive for headaches. Negative for dizziness.  Psychiatric/Behavioral: Negative.       I have reviewed patient's Past Medical Hx, Surgical Hx, Family Hx, Social Hx, medications and allergies.   Physical Exam  Patient Vitals for the  past 24 hrs:  BP Temp Temp src Pulse Resp SpO2 Height Weight  02/26/22 0619 122/72 98 F (36.7 C) Oral (!) 127 17 98 %  (1.626 m) 63.5 kg   Constitutional: Well-developed, well-nourished female in no acute distress.  Cardiovascular: mild tachycardia 120s Respiratory: normal effort GI: Abd soft, non-tender. Pos BS x 4 MS: Extremities nontender, no edema, normal ROM Neurologic: Alert and oriented x 4.  GU: Neg CVAT bilaterally  PELVIC EXAM: Deferred  FHT 170 by doppler  LAB RESULTS No results found for this or any previous visit (from the past 24 hour(s)).  O/RH(D) POSITIVE/-- (04/25 1023)  IMAGING [place] MFM OB DETAIL +14 WK  Result  Date: 02/05/2022 ----------------------------------------------------------------------  OBSTETRICS REPORT                       (Signed Final 02/05/2022 03:54 pm) ---------------------------------------------------------------------- Patient Info  ID #:       [id number]                          D.O.B.:  [birth year] (26 yrs)  Name:       Julie Wilkinson                     Visit Date: 02/05/2022 10:44 am ---------------------------------------------------------------------- Performed By  Attending:        Lin Landsman      Ref. Address:     [address]                    MD                                                             [address] [place],                                                             [place] [address]  Performed By:     Tommie Raymond BS,       [facility]                    RD[facility], RVT                                [facility]                                                             [facility]  Referred By:      Duane Lope NP ---------------------------------------------------------------------- Orders  #  Description                           Code        Ordered By  1  [place] MFM OB DETAIL +14 WK               76811.01    [facility] ----------------------------------------------------------------------  #  Order #                     Accession #                Episode #  1  [id number]                   [id number]                 [id number] ---------------------------------------------------------------------- Indications  [redacted] weeks gestation of pregnancy                Z3A.19  Encounter for antenatal screening for          Z36.3  malformations  Echogenic intracardiac focus of the heart      O35.8XX0  (EIF)  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 Cord Insertion:      Visualized, central  Amniotic Fluid  AFI FV:      Within normal limits                              Largest Pocket(cm)                              5.29 ---------------------------------------------------------------------- Biometry  BPD:      42.4  mm     G. Age:  18w 6d         43  %    CI:        68.41   %    70 - 86                                                          FL/HC:      17.4   %    16.1 - 18.3  HC:    163.87   mm     G. Age:  19w 1d  49  %    HC/AC:      1.12        1.09 - 1.39  AC:    145.96   mm     G. Age:  19w 6d         75  %    FL/BPD:     67.1   %  FL:      28.47  mm     G. Age:  18w 5d         33  %    FL/AC:      19.5   %    20 - 24  HUM:      28.7  mm     G. Age:  19w 2d         58  %  CER:      20.6  mm     G. Age:  19w 5d         70  %  LV:        7.4  mm  CM:        4.4  mm  Est. FW:     286  gm    0 lb 10 oz      65  % ---------------------------------------------------------------------- OB History  Blood Type:   O+  Gravidity:    6         Term:   2        Prem:   0        SAB:   1  TOP:          1       Ectopic:  0        Living: 2 ---------------------------------------------------------------------- Gestational Age  LMP:           19w 0d        Date:  09/25/21                  EDD:   07/02/22  U/S Today:     19w 1d                                        EDD:   07/01/22  Best:          19w 0d     Det. By:  LMP  (09/25/21)          EDD:   07/02/22 ---------------------------------------------------------------------- Anatomy  Cranium:               Appears normal         LVOT:                   Appears normal  Cavum:                 Appears normal         Aortic Arch:            Appears normal  Ventricles:             Appears normal         Ductal Arch:            Appears normal  Choroid Plexus:        Appears normal         Diaphragm:  Appears normal  Cerebellum:            Appears normal         Stomach:                Appears normal, left                                                                        sided  Posterior Fossa:       Appears normal         Abdomen:                Echogenic Bowel  Nuchal Fold:           Appears normal         Abdominal Wall:         Appears nml (cord                                                                        insert, abd wall)  Face:                  Appears normal         Cord Vessels:           Appears normal (3                         (orbits and profile)                           vessel cord)  Lips:                  Appears normal         Kidneys:                Appear normal  Palate:                Appears normal         Bladder:                Appears normal  Thoracic:              Appears normal         Spine:                  Appears normal  Heart:                 Appears normal; EIF    Upper Extremities:      Appears normal  RVOT:                  Appears normal         Lower Extremities:      Appears normal  Other:  Fetus appears to be a female. Heels/feet and open hands/5th digits          visualized. Nasal bone, lenses, maxilla, mandible and  falx visualized          VC, 3VV and 3VTV visualized. ---------------------------------------------------------------------- Targeted Anatomy  Central Nervous System  Calvarium/Cranial V.:  Appears normal         Choroid Plexus:         Appears normal  Intracranial Anat:     Appears normal         Cereb./Vermis:          Appears normal  Cavum:                 Appears normal         Cisterna Magna:         Appears normal  Parenchyma:            Appears normal         Midline Falx:           Appears normal  Lateral Ventricles:    Appears normal  Spine  Cervical:              Appears normal         Sacral:                  Appears Normal  Thoracic:              Appears normal         Shape/Curvature:        Appears normal  Lumbar:                Appears normal  Head/Neck  Face:                  Appears norma          Palate:                 Appears normal  Lips:                  Appears normal         Profile:                Appears normal  Neck:                  Appears normal         Orbits/Eyes:            Appears normal  Nuchal Fold:           Appears normal         Mandible:               Appears normal  Nasal Bone:            Present                Maxilla:                Appears normal  Thorax  Thoracic Contour:      Appears normal         Ductal Arch:            Appears normal  Lungs:                 Appears normal         SVC:                    Appears normal  4 Chamber View:        Appears normal         Interventr.  Septum:     Appears normal  Cardiac Activity:      Observed               Cardiac Axis:           Normal  Cardiac Situs:         Appears normal         Diaphragm:              Appears normal  Rt Outflow Tract:      Appears normal         3 Vessel View:          Appears normal  Lt Outflow Tract:      Appears normal         3 V Trachea View:       Appears normal  Aortic Arch:           Appears normal         IVC:                    Appears normal  Abdomen  Ventral Wall:          Appears normal         Lt Kidney:              Appears normal  Cord Insertion:        Appears normal         Rt Kidney:              Appears normal  Situs:                 Within normal limits   Bladder:                Appears normal  Stomach:               Appears normal         Bowel:                  Appears normal  Extremities  Lt Humerus:            Appears normal         Lt Femur:               Appears normal  Rt Humerus:            Appears normal         Rt Femur:               Appears normal  Lt Forearm:            Appears normal         Lt Lower Leg:           Appears normal  Rt Forearm:            Appears normal         Rt  Lower Leg:           Appears normal  Lt Hand:               Open hand nml          Lt Foot:                Appears normal  Rt Hand:               Open hand nml          Rt Foot:  Appears normal  Other  Umbilical Cord:        Normal 3-vessel        Genitalia:              Normal Female ---------------------------------------------------------------------- Cervix Uterus Adnexa  Cervix  Length:              5  cm.  Normal appearance by transabdominal scan.  Uterus  No abnormality visualized.  Right Ovary  Size(cm)     1.81   x   1.95   x  1.5       Vol(ml): 2.77  Within normal limits.  Left Ovary  Size(cm)       2.5  x   2.79   x  1.3       Vol(ml): 4.75  Within normal limits.  Cul De Sac  No free fluid seen.  Adnexa  No abnormality visualized. ---------------------------------------------------------------------- Impression  MFM Brief consultation  Ms. Hustead is a G6P2 at 19 weeks with an EDD of 07/02/22.  SHe is seen at the request of Venia Carbon, NP  Single intrauterine pregnancy here for a detailed anatomy  due echogenic bowel  Normal anatomy with measurements consistent with dates  There is good fetal movement and amniotic fluid volume  We observed echogenic bowel and echogenic intracardiac  focus.   Echogenic abdominal lesions are areas of abnormal  brightness in the fetal abdomen, with echogenicity similar to  that of surrounding bone, and producing various amounts of  acoustic shadowing. Echogenic bowel (approximately 1% of  second-trimester pregnancies) results from excessively thick  meconium caused by an aperistaltic small bowel. Swallowed  blood can also produce an echogenic appearance (Ms Kreager  reports have a recently resolved subchorionic hemorrhage  which was not seen today).  Echogenic bowel is associated with aneuploidy, TORCH  (toxoplasmosis, other [e.g., syphilis, parvovirus B19,  varicella], rubella, cytomegalovirus [CMV], herpesvirus)  infection, meconium ileus, cystic fibrosis,  placental  hemorrhage, ischemia, or can be a normal variant.  Approximately 50% of fetuses with echogenic bowel have  other structural abnormalities or fetal growth restriction (or  both). Aneuploidy (trisomy 13, 18, 21, triploidy) is present in  up to 10%.; Cystic fibrosis is present in 13%-40% of patients  with meconium ileus and TORCH infections are present in  10%.  Approximately 50% of cases of isolated echogenic bowel  resolve spontaneously prenatally.  At this time we recommend serum screening, cystic fibrosis  screeing, serologies for toxoplasmosis and CMV and serial  ultrasounds for fetal growth, amniotic fluid and worsening  fetal condition (ie hydrops fetalis). Assuming no chromosome  defects, cystic fibrosis, infection, growth restriction, or other  associated anatomic abnormalities, the prognosis is good for  normal outcome.  I discussed the option of screening vs diagnostic studies and  the risk and benefits of both options. At this time Ms. Bennie  opted  out of invasive testing , as the diagnosis would not  change her care for the pregnancy.  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  An echogenic intracardiac focus was observed today. I  discussed that there is no increased risk to the function or  structure of the heart. In addition, in the context of a low risk  NIPS and neg horizon result the risk for aneuploidy are  reduced. There were no additional markers of aneuploidy  observed.  I reviewed that an ultrasound is a screening exam  and that a  diagnostic exam via amnioticenetesis is the only  test available to provide a definitive result.  Repeat growth is scheduled in 4 weeks.  All questions were answered.  I spent 30 minute with > 50% in face to face consultation.  Novella Olive, MD ----------------------------------------------------------------------              Lin Landsman, MD Electronically Signed Final Report   02/05/2022 03:54 pm  ----------------------------------------------------------------------   MAU Management/MDM: Orders Placed This Encounter  Procedures   Urinalysis, Routine w reflex microscopic Urine, Clean Catch    No orders of the defined types were placed in this encounter.   No acute findings on exam. Pt with mild tachycardia and FHT elevated at 170.  IV fluids given.  Pt afebrile in MAU after Tylenol at home.  No respiratory symptoms so unlikely COVID/Flu but swab pending.  UA pending.    Report to Dr Crissie Reese.   Sharen Counter Certified Nurse-Midwife 02/26/2022  6:43 AM    Addendum: Patient re-evaluated, she is feeling better. ROS completed and again notable for having no sore throat, cough, chest pain, SOB, urinary pain/burning, vaginal discharge. One of her children had a fever recently but is well now. On exam only notable finding is erythematous pharynx (though again she denies sore throat) and cervix closed on exam (externally feels multiparous but very much closed internally). Lab workup shows negative wet prep and negative respiratory viral panel. At this point presumed viral infection, possibly pharyngitis, will offer Strep swab prior to discharge but overall she is well appearing with no obvious source of infection. Chorio a possibility but very unlikely. Discussed return precautions including worsening of symptoms, development of new symptoms, vaginal bleeding, leaking of fluid, contractions, etc.   Patient discharged to home in stable condition.  Venora Maples, MD/MPH Attending Family Medicine Physician, [facility], [facility]  9:38 AM 02/26/22

## 2022-02-26 NOTE — Discharge Instructions (Signed)
You were seen in the MAU for a fever. We did many tests to look for a cause but everything has returned normal. In addition your cervix is closed. At this point we are not sure what caused the fever, but it is most likely because of a viral infection. There are no medications for this, but you can take tylenol and make sure you stay hydrated. Some labs are not back yet, and we will call you if anything results positive. Come back to the MAU if you have ongoing fevers, feel worse, or develop new symptoms.

## 2022-02-27 LAB — GC/CHLAMYDIA PROBE AMP (~~LOC~~) NOT AT ARMC
Chlamydia: NEGATIVE
Comment: NEGATIVE
Comment: NORMAL
Neisseria Gonorrhea: NEGATIVE

## 2022-02-27 LAB — CULTURE, OB URINE: Culture: NO GROWTH

## 2022-03-03 ENCOUNTER — Ambulatory Visit (INDEPENDENT_AMBULATORY_CARE_PROVIDER_SITE_OTHER): Payer: Medicaid Other | Admitting: Obstetrics and Gynecology

## 2022-03-03 VITALS — BP 117/74 | HR 102 | Wt 147.0 lb

## 2022-03-03 DIAGNOSIS — Z3480 Encounter for supervision of other normal pregnancy, unspecified trimester: Secondary | ICD-10-CM

## 2022-03-03 DIAGNOSIS — F319 Bipolar disorder, unspecified: Secondary | ICD-10-CM

## 2022-03-03 DIAGNOSIS — F419 Anxiety disorder, unspecified: Secondary | ICD-10-CM

## 2022-03-03 DIAGNOSIS — F32A Depression, unspecified: Secondary | ICD-10-CM

## 2022-03-03 NOTE — Progress Notes (Signed)
   PRENATAL VISIT NOTE  Subjective:  Julie Wilkinson is a 26 y.o. [id number] at [redacted]w[redacted]d being seen today for ongoing prenatal care.  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  Patient reports no complaints.  Contractions: Not present. Vag. Bleeding: None.  Movement: Present. Denies leaking of fluid. Bipolar symptoms stable. No SSI. Moods are still up and down. No panic attacks. Stopped lithium and does not have good physiatric resources.   The following portions of the patient's history were reviewed and updated as appropriate: allergies, current medications, past family history, past medical history, past social history, past surgical history and problem list.   Objective:   Vitals:   03/03/22 1325  BP: 117/74  Pulse: (!) 102  Weight: 147 lb (66.7 kg)    Fetal Status: Fetal Heart Rate (bpm): 156   Movement: Present     General:  Alert, oriented and cooperative. Patient is in no acute distress.  Skin: Skin is warm and dry. No rash noted.   Cardiovascular: Normal heart rate noted  Respiratory: Normal respiratory effort, no problems with respiration noted  Abdomen: Soft, gravid, appropriate for gestational age.  Pain/Pressure: Absent     Pelvic: Cervical exam deferred        Extremities: Normal range of motion.  Edema: None  Mental Status: Normal mood and affect. Normal behavior. Normal judgment and thought content.   Assessment and Plan:  Pregnancy: [id number] at [redacted]w[redacted]d  1. Anxiety and depression  - Ambulatory referral to Psychiatry  2. Bipolar 1 disorder (HCC)  - Ambulatory referral to Psychiatry   3. Supervision of other  normal pregnancy, antepartum  Doing well No complaints.  Continue growth [place] with MFM   Preterm labor symptoms and general obstetric precautions including but not limited to vaginal bleeding, contractions, leaking of fluid and fetal movement were reviewed in detail with the patient. Please refer to After Visit Summary for other counseling recommendations.   Return in about 4 weeks (around 03/31/2022), or For 2 hour GTT, come fasting..  Future Appointments  Date Time Provider Department Center  03/05/2022  1:30 PM [facility] NURSE [facility]  03/05/2022  1:45 PM [facility] US6 [facility]  04/02/2022  8:30 AM Constant, Gigi Gin, MD [facility]    Venia Carbon, NP

## 2022-03-05 ENCOUNTER — Ambulatory Visit: Payer: Medicaid Other | Admitting: *Deleted

## 2022-03-05 ENCOUNTER — Other Ambulatory Visit: Payer: Self-pay | Admitting: *Deleted

## 2022-03-05 ENCOUNTER — Ambulatory Visit: Payer: Medicaid Other | Attending: Maternal & Fetal Medicine

## 2022-03-05 VITALS — BP 102/59 | HR 100

## 2022-03-05 DIAGNOSIS — O358XX0 Maternal care for other (suspected) fetal abnormality and damage, not applicable or unspecified: Secondary | ICD-10-CM | POA: Diagnosis not present

## 2022-03-05 DIAGNOSIS — O99342 Other mental disorders complicating pregnancy, second trimester: Secondary | ICD-10-CM | POA: Insufficient documentation

## 2022-03-05 DIAGNOSIS — O35DXX0 Maternal care for other (suspected) fetal abnormality and damage, fetal gastrointestinal anomalies, not applicable or unspecified: Secondary | ICD-10-CM

## 2022-03-05 DIAGNOSIS — F319 Bipolar disorder, unspecified: Secondary | ICD-10-CM

## 2022-03-05 DIAGNOSIS — Z148 Genetic carrier of other disease: Secondary | ICD-10-CM | POA: Diagnosis not present

## 2022-03-05 DIAGNOSIS — O35BXX0 Maternal care for other (suspected) fetal abnormality and damage, fetal cardiac anomalies, not applicable or unspecified: Secondary | ICD-10-CM

## 2022-03-05 DIAGNOSIS — O283 Abnormal ultrasonic finding on antenatal screening of mother: Secondary | ICD-10-CM

## 2022-03-05 DIAGNOSIS — Z3480 Encounter for supervision of other normal pregnancy, unspecified trimester: Secondary | ICD-10-CM

## 2022-03-05 DIAGNOSIS — Z3A23 23 weeks gestation of pregnancy: Secondary | ICD-10-CM | POA: Diagnosis not present

## 2022-03-05 DIAGNOSIS — O285 Abnormal chromosomal and genetic finding on antenatal screening of mother: Secondary | ICD-10-CM | POA: Diagnosis not present

## 2022-03-05 DIAGNOSIS — O35DXX1 Maternal care for other (suspected) fetal abnormality and damage, fetal gastrointestinal anomalies, fetus 1: Secondary | ICD-10-CM | POA: Insufficient documentation

## 2022-03-05 DIAGNOSIS — O280 Abnormal hematological finding on antenatal screening of mother: Secondary | ICD-10-CM

## 2022-03-24 ENCOUNTER — Other Ambulatory Visit: Payer: Self-pay

## 2022-03-24 DIAGNOSIS — L299 Pruritus, unspecified: Secondary | ICD-10-CM | POA: Diagnosis not present

## 2022-03-24 NOTE — Progress Notes (Signed)
Pt sent MyChart message about itching in pregnancy. Bile acids ordered.

## 2022-03-25 ENCOUNTER — Other Ambulatory Visit: Payer: Self-pay

## 2022-03-25 DIAGNOSIS — Z3480 Encounter for supervision of other normal pregnancy, unspecified trimester: Secondary | ICD-10-CM

## 2022-03-25 DIAGNOSIS — L299 Pruritus, unspecified: Secondary | ICD-10-CM

## 2022-03-25 MED ORDER — URSODIOL 300 MG PO CAPS: 300.0000 mg | ORAL_CAPSULE | Freq: Two times a day (BID) | ORAL | 0 refills | Status: DC

## 2022-03-25 NOTE — Progress Notes (Signed)
Actigall 300mg  BID sent per , NP

## 2022-03-30 LAB — BILE ACIDS, PREGNANCY
CHENODEOXYCHOLIC ACID: 1.3 umol/L (ref <=3.9)
CHOLIC ACID: <0.5 umol/L (ref <=2.8)
DEOXYCHOLIC ACID: 1.3 umol/L (ref <=2.3)
TOTAL BILE ACIDS: 2.5 umol/L (ref <=8.3)

## 2022-04-02 ENCOUNTER — Ambulatory Visit (INDEPENDENT_AMBULATORY_CARE_PROVIDER_SITE_OTHER): Payer: Medicaid Other | Admitting: Obstetrics and Gynecology

## 2022-04-02 ENCOUNTER — Encounter: Payer: Self-pay | Admitting: Obstetrics and Gynecology

## 2022-04-02 VITALS — BP 101/69 | HR 88 | Wt 152.0 lb

## 2022-04-02 DIAGNOSIS — Z3480 Encounter for supervision of other normal pregnancy, unspecified trimester: Secondary | ICD-10-CM | POA: Diagnosis not present

## 2022-04-02 DIAGNOSIS — R87613 High grade squamous intraepithelial lesion on cytologic smear of cervix (HGSIL): Secondary | ICD-10-CM

## 2022-04-02 DIAGNOSIS — F319 Bipolar disorder, unspecified: Secondary | ICD-10-CM

## 2022-04-02 DIAGNOSIS — O283 Abnormal ultrasonic finding on antenatal screening of mother: Secondary | ICD-10-CM

## 2022-04-02 NOTE — Progress Notes (Signed)
TDAP next visit   

## 2022-04-02 NOTE — Progress Notes (Signed)
Pt will get Tdap at next appt

## 2022-04-02 NOTE — Progress Notes (Signed)
   PRENATAL VISIT NOTE  Subjective:  Julie Wilkinson is a 26 y.o. [id number] at [redacted]w[redacted]d being seen today for ongoing prenatal care.  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  Patient reports no complaints.  Contractions: Not present. Vag. Bleeding: None.  Movement: Present. Denies leaking of fluid.   The following portions of the patient's history were reviewed and updated as appropriate: allergies, current medications, past family history, past medical history, past social history, past surgical history and problem list.   Objective:   Vitals:   04/02/22 0830  BP: 101/69  Pulse: 88  Weight: 152 lb (68.9 kg)    Fetal Status: Fetal Heart Rate (bpm): 157 Fundal Height: 27 cm Movement: Present     General:  Alert, oriented and cooperative. Patient is in no acute distress.  Skin: Skin is warm and dry. No rash noted.   Cardiovascular: Normal heart rate noted  Respiratory: Normal respiratory effort, no problems with respiration noted  Abdomen: Soft, gravid, appropriate for gestational age.  Pain/Pressure: Absent     Pelvic: Cervical exam deferred        Extremities: Normal range of motion.  Edema: None  Mental Status: Normal mood and affect. Normal behavior. Normal judgment and thought content.   Assessment and Plan:  Pregnancy: [id number] at [redacted]w[redacted]d 1. Supervision of other normal pregnancy, antepartum Patient is doing well without complaints Third trimester labs and glucola today Patient opted for Tdap next visit Plans nexplanon for contraception  2. 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 HSIL (high grade squamous intraepithelial lesion) on Pap smear of cervix Desires colpo postpartum  4. Bipolar 1 disorder (HCC) Awaiting appointment with psychiatry Currently stable  Preterm labor symptoms and general obstetric precautions including but not limited to vaginal bleeding, contractions, leaking of fluid and fetal movement were reviewed in detail with the patient. Please refer to After Visit Summary for other counseling recommendations.   Return in about 2 weeks (around 04/16/2022) for in person, ROB, Low risk.  Future Appointments  Date Time Provider Department Center  05/07/2022  9:15 AM [facility] NURSE [facility]  05/07/2022  9:30 AM [facility] US3 [facility]    Catalina Antigua, MD

## 2022-04-03 LAB — CBC
HCT: 32.6 % — ABNORMAL LOW (ref 35.0–45.0)
Hemoglobin: 10.7 g/dL — ABNORMAL LOW (ref 11.7–15.5)
MCH: 31 pg (ref 27.0–33.0)
MCHC: 32.8 g/dL (ref 32.0–36.0)
MCV: 94.5 fL (ref 80.0–100.0)
MPV: 10.8 fL (ref 7.5–12.5)
Platelets: 211 10*3/uL (ref 140–400)
RBC: 3.45 10*6/uL — ABNORMAL LOW (ref 3.80–5.10)
RDW: 11.6 % (ref 11.0–15.0)
WBC: 7.4 10*3/uL (ref 3.8–10.8)

## 2022-04-03 LAB — HIV ANTIBODY (ROUTINE TESTING W REFLEX): HIV 1&2 Ab, 4th Generation: NONREACTIVE

## 2022-04-03 LAB — 2HR GTT W 1 HR, CARPENTER, 75 G
Glucose, 1 Hr, Gest: 83 mg/dL (ref 65–179)
Glucose, 2 Hr, Gest: 73 mg/dL (ref 65–152)
Glucose, Fasting, Gest: 61 mg/dL — ABNORMAL LOW (ref 65–91)

## 2022-04-03 LAB — RPR: RPR Ser Ql: NONREACTIVE

## 2022-04-14 ENCOUNTER — Ambulatory Visit (INDEPENDENT_AMBULATORY_CARE_PROVIDER_SITE_OTHER): Payer: Medicaid Other

## 2022-04-14 VITALS — BP 106/68 | HR 91 | Wt 151.0 lb

## 2022-04-14 DIAGNOSIS — Z3A28 28 weeks gestation of pregnancy: Secondary | ICD-10-CM | POA: Diagnosis not present

## 2022-04-14 DIAGNOSIS — Z3482 Encounter for supervision of other normal pregnancy, second trimester: Secondary | ICD-10-CM

## 2022-04-14 DIAGNOSIS — R87613 High grade squamous intraepithelial lesion on cytologic smear of cervix (HGSIL): Secondary | ICD-10-CM

## 2022-04-14 DIAGNOSIS — Z3480 Encounter for supervision of other normal pregnancy, unspecified trimester: Secondary | ICD-10-CM

## 2022-04-14 DIAGNOSIS — O283 Abnormal ultrasonic finding on antenatal screening of mother: Secondary | ICD-10-CM

## 2022-04-14 DIAGNOSIS — F319 Bipolar disorder, unspecified: Secondary | ICD-10-CM

## 2022-04-14 NOTE — Progress Notes (Signed)
Pt declined tdap at this visit

## 2022-04-14 NOTE — Progress Notes (Signed)
   PRENATAL VISIT NOTE  Subjective:  Julie Wilkinson is a 26 y.o. [id number] at [redacted]w[redacted]d being seen today for ongoing prenatal care.  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  Patient reports no complaints.  Contractions: Not present. Vag. Bleeding: None.  Movement: Present. Denies leaking of fluid.   The following portions of the patient's history were reviewed and updated as appropriate: allergies, current medications, past family history, past medical history, past social history, past surgical history and problem list.   Objective:   Vitals:   04/14/22 1623  BP: 106/68  Pulse: 91  Weight: 151 lb (68.5 kg)    Fetal Status: Fetal Heart Rate (bpm): 151 Fundal Height: 27 cm Movement: Present     General:  Alert, oriented and cooperative. Patient is in no acute distress.  Skin: Skin is warm and dry. No rash noted.   Cardiovascular: Normal heart rate noted  Respiratory: Normal respiratory effort, no problems with respiration noted  Abdomen: Soft, gravid, appropriate for gestational age.  Pain/Pressure: Absent     Pelvic: Cervical exam deferred        Extremities: Normal range of motion.  Edema: None  Mental Status: Normal mood and affect. Normal behavior. Normal judgment and thought content.   Assessment and Plan:  Pregnancy: L2G4010 at [redacted]w[redacted]d 1. Supervision of other normal pregnancy, antepartum - Routine OB. Doing well, no concerns - Declined Tdap - Anticipatory guidance for upcoming appointments provided  2. [redacted] weeks gestation of pregnancy - FH appropriate - Endorses active fetal movement  3. Echogenic bowel of fetus on  prenatal ultrasound - F/u ultrasound on 9/27  4. HSIL (high grade squamous intraepithelial lesion) on Pap smear of cervix - Desires PP colpo  5. Bipolar 1 disorder (HCC) - Stable - Awaiting psychiatry appointment   Preterm labor symptoms and general obstetric precautions including but not limited to vaginal bleeding, contractions, leaking of fluid and fetal movement were reviewed in detail with the patient. Please refer to After Visit Summary for other counseling recommendations.   Return in about 2 weeks (around 04/28/2022) for LOB.  Future Appointments  Date Time Provider Department Center  04/28/2022  9:10 AM Brand Males, CNM [facility]  05/07/2022  9:15 AM [facility] NURSE [facility]  05/07/2022  9:30 AM [facility] US3 [facility]    Brand Males, CNM

## 2022-04-23 ENCOUNTER — Other Ambulatory Visit (INDEPENDENT_AMBULATORY_CARE_PROVIDER_SITE_OTHER): Payer: Medicaid Other

## 2022-04-23 DIAGNOSIS — Z3480 Encounter for supervision of other normal pregnancy, unspecified trimester: Secondary | ICD-10-CM

## 2022-04-23 NOTE — Progress Notes (Signed)
Pt request Bile Acids labs due to itching in pregnancy. Pt given lab orders and sent to lab.

## 2022-04-24 LAB — COMPREHENSIVE METABOLIC PANEL
AG Ratio: 1.3 (calc) (ref 1.0–2.5)
ALT: 7 U/L (ref 6–29)
AST: 15 U/L (ref 10–30)
Albumin: 3.5 g/dL — ABNORMAL LOW (ref 3.6–5.1)
Alkaline phosphatase (APISO): 61 U/L (ref 31–125)
BUN/Creatinine Ratio: 12 (calc) (ref 6–22)
BUN: 6 mg/dL — ABNORMAL LOW (ref 7–25)
CO2: 23 mmol/L (ref 20–32)
Calcium: 8.8 mg/dL (ref 8.6–10.2)
Chloride: 104 mmol/L (ref 98–110)
Creat: 0.52 mg/dL (ref 0.50–0.96)
Globulin: 2.6 g/dL (calc) (ref 1.9–3.7)
Glucose, Bld: 72 mg/dL (ref 65–99)
Potassium: 4.2 mmol/L (ref 3.5–5.3)
Sodium: 135 mmol/L (ref 135–146)
Total Bilirubin: 0.9 mg/dL (ref 0.2–1.2)
Total Protein: 6.1 g/dL (ref 6.1–8.1)

## 2022-04-25 ENCOUNTER — Encounter (HOSPITAL_COMMUNITY): Payer: Self-pay | Admitting: Obstetrics and Gynecology

## 2022-04-25 ENCOUNTER — Inpatient Hospital Stay (HOSPITAL_COMMUNITY)
Admission: AD | Admit: 2022-04-25 | Discharge: 2022-04-25 | Disposition: A | Discharge status: Home / Self Care | Payer: Medicaid Other | Attending: Obstetrics and Gynecology | Admitting: Obstetrics and Gynecology

## 2022-04-25 ENCOUNTER — Other Ambulatory Visit: Payer: Self-pay

## 2022-04-25 DIAGNOSIS — Z3A30 30 weeks gestation of pregnancy: Secondary | ICD-10-CM | POA: Diagnosis not present

## 2022-04-25 DIAGNOSIS — O26899 Other specified pregnancy related conditions, unspecified trimester: Secondary | ICD-10-CM

## 2022-04-25 DIAGNOSIS — R109 Unspecified abdominal pain: Secondary | ICD-10-CM | POA: Insufficient documentation

## 2022-04-25 DIAGNOSIS — O26893 Other specified pregnancy related conditions, third trimester: Secondary | ICD-10-CM | POA: Diagnosis not present

## 2022-04-25 LAB — URINALYSIS, ROUTINE W REFLEX MICROSCOPIC
Bilirubin Urine: NEGATIVE
Glucose, UA: NEGATIVE mg/dL
Hgb urine dipstick: NEGATIVE
Ketones, ur: NEGATIVE mg/dL
Leukocytes,Ua: NEGATIVE
Nitrite: NEGATIVE
Protein, ur: NEGATIVE mg/dL
Specific Gravity, Urine: 1.004 — ABNORMAL LOW (ref 1.005–1.030)
pH: 6 (ref 5.0–8.0)

## 2022-04-25 NOTE — MAU Note (Signed)
Julie Wilkinson is a 26 y.o. at [redacted]w[redacted]d here in MAU reporting: ongoing contractions but they feel more regular today. Every 2-5 min. Denies bleeding or LOF.  Onset of complaint: ongoing but worse  Pain score: 3/10  Vitals:   04/25/22 1308  BP: (!) 105/54  Pulse: 92  Resp: 16  Temp: 98.2 F (36.8 C)  SpO2: 100%     FHT:+FM, EFM applied in room  Lab orders placed from triage: UA

## 2022-04-25 NOTE — MAU Provider Note (Signed)
History     [id number]  Arrival date and time: 04/25/22 1248    Chief Complaint  Patient presents with   Contractions     HPI Julie Wilkinson is a 26 y.o. at [redacted]w[redacted]d who presents for contractions. Has been feeling contractions for a while but states they became more regular today. Reports they occur every 2-5 minutes. Most of them are painful, others are tightening. Denies n/v/d, dysuria, vaginal bleeding, or LOF. Reports good fetal movement. No recent intercourse.   OB History     Gravida  5   Para  2   Term  2   Preterm      AB  2   Living  2      SAB  1   IAB  1   Ectopic      Multiple      Live Births  2           Past Medical History:  Diagnosis Date   Anxiety 2017    Past Surgical History:  Procedure Laterality Date   HYMENECTOMY  26 years old    Family History  Problem Relation Age of Onset   Bipolar disorder Mother    Depression Father    Bipolar disorder Sister    Asthma Neg Hx    Cancer Neg Hx    Diabetes Neg Hx    Heart disease Neg Hx    Hypertension Neg Hx    Stroke Neg Hx     Allergies  Allergen Reactions   Loracarbef Hives    No current facility-administered medications on file prior to encounter.   Current Outpatient Medications on File Prior to Encounter  Medication Sig Dispense Refill   promethazine (PHENERGAN) 25 MG tablet Take 1 tablet (25 mg total) by mouth every 6 (six) hours as needed for nausea or vomiting. 30 tablet 1   acetaminophen (TYLENOL) 325 MG tablet Take 2 tablets (650 mg total) by mouth every 4 (four) hours as needed (for pain scale < 4). (Patient not taking: Reported on 03/05/2022) 30 tablet 0   ursodiol (ACTIGALL) 300 MG capsule Take 1 capsule (300 mg total) by mouth 2 (two) times daily. 14 capsule 0     ROS Pertinent positives and negative per HPI, all others reviewed and negative  Physical Exam   BP (!) 105/54 (BP Location: Right Arm)   Pulse 92   Temp 98.2 F (36.8 C) (Oral)   Resp 16   Ht  5\' 4"  (1.626 m)   Wt 69.4 kg   LMP 09/25/2021   SpO2 100%   BMI 26.25 kg/m   Patient Vitals for the past 24 hrs:  BP Temp Temp src Pulse Resp SpO2 Height Weight  04/25/22 1308 (!) 105/54 98.2 F (36.8 C) Oral 92 16 100 % -- --  04/25/22 1303 -- -- -- -- -- -- 5\' 4"  (1.626 m) 69.4 kg    Physical Exam Vitals and nursing note reviewed. Exam conducted with a chaperone present.  Constitutional:      General: She is not in acute distress.    Appearance: Normal appearance.  HENT:     Head: Normocephalic and atraumatic.  Eyes:     General: No scleral icterus.    Conjunctiva/sclera: Conjunctivae normal.  Pulmonary:     Effort: Pulmonary effort is normal. No respiratory distress.  Abdominal:     Palpations: Abdomen is soft.     Tenderness: There is no abdominal tenderness.     Comments: No ctx  palpated  Skin:    General: Skin is warm and dry.  Neurological:     Mental Status: She is alert.      Cervical Exam Dilation: Closed (externally 2 cm) Effacement (%): Thick Cervical Position: Posterior Station: -3 Exam by:: Jorje Guild NP   FHT Baseline 150, moderate variability, 15x15 accels, no decels Toco: none Cat: 1  Labs Results for orders placed or performed during the hospital encounter of 04/25/22 (from the past 24 hour(s))  Urinalysis, Routine w reflex microscopic Urine, Clean Catch     Status: Abnormal   Collection Time: 04/25/22  1:31 PM  Result Value Ref Range   Color, Urine STRAW (A) YELLOW   APPearance CLEAR CLEAR   Specific Gravity, Urine 1.004 (L) 1.005 - 1.030   pH 6.0 5.0 - 8.0   Glucose, UA NEGATIVE NEGATIVE mg/dL   Hgb urine dipstick NEGATIVE NEGATIVE   Bilirubin Urine NEGATIVE NEGATIVE   Ketones, ur NEGATIVE NEGATIVE mg/dL   Protein, ur NEGATIVE NEGATIVE mg/dL   Nitrite NEGATIVE NEGATIVE   Leukocytes,Ua NEGATIVE NEGATIVE    Imaging No results found.  MAU Course  Procedures Lab Orders         Urinalysis, Routine w reflex microscopic Urine,  Clean Catch    No orders of the defined types were placed in this encounter.  Imaging Orders  No imaging studies ordered today    MDM Patient presents for evaluation of contractions. No regular contractions on monitor & abdomen soft.  Cervix closed/thick (ext 2cm) & unchanged after an hour of monitoring. U/a negative. Stable for discharge.  Assessment and Plan   1. Abdominal cramping affecting pregnancy   2. [redacted] weeks gestation of pregnancy    -Reviewed PTL precautions & reasons to return to [place], NP 04/25/22 2:51 PM

## 2022-04-28 ENCOUNTER — Ambulatory Visit (INDEPENDENT_AMBULATORY_CARE_PROVIDER_SITE_OTHER): Payer: Medicaid Other

## 2022-04-28 VITALS — BP 100/68 | HR 99 | Wt 154.0 lb

## 2022-04-28 DIAGNOSIS — O283 Abnormal ultrasonic finding on antenatal screening of mother: Secondary | ICD-10-CM

## 2022-04-28 DIAGNOSIS — F319 Bipolar disorder, unspecified: Secondary | ICD-10-CM

## 2022-04-28 DIAGNOSIS — Z3480 Encounter for supervision of other normal pregnancy, unspecified trimester: Secondary | ICD-10-CM

## 2022-04-28 DIAGNOSIS — Z3A30 30 weeks gestation of pregnancy: Secondary | ICD-10-CM

## 2022-04-28 DIAGNOSIS — R87613 High grade squamous intraepithelial lesion on cytologic smear of cervix (HGSIL): Secondary | ICD-10-CM

## 2022-04-28 DIAGNOSIS — Z23 Encounter for immunization: Secondary | ICD-10-CM | POA: Diagnosis not present

## 2022-04-28 NOTE — Progress Notes (Signed)
   PRENATAL VISIT NOTE  Subjective:  Julie Wilkinson is a 26 y.o. [id number] at [redacted]w[redacted]d being seen today for ongoing prenatal care.  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  Patient reports no complaints.  Contractions: Irritability. Vag. Bleeding: None.  Movement: Present. Denies leaking of fluid.   The following portions of the patient's history were reviewed and updated as appropriate: allergies, current medications, past family history, past medical history, past social history, past surgical history and problem list.   Objective:   Vitals:   04/28/22 0907  BP: 100/68  Pulse: 99  Weight: 154 lb (69.9 kg)    Fetal Status: Fetal Heart Rate (bpm): 154 Fundal Height: 30 cm Movement: Present     General:  Alert, oriented and cooperative. Patient is in no acute distress.  Skin: Skin is warm and dry. No rash noted.   Cardiovascular: Normal heart rate noted  Respiratory: Normal respiratory effort, no problems with respiration noted  Abdomen: Soft, gravid, appropriate for gestational age.  Pain/Pressure: Present     Pelvic: Cervical exam deferred        Extremities: Normal range of motion.  Edema: None  Mental Status: Normal mood and affect. Normal behavior. Normal judgment and thought content.   Assessment and Plan:  Pregnancy: [id number] at [redacted]w[redacted]d 1. Supervision of other normal pregnancy, antepartum - Routine OB. Doing well, no concerns - Seen in MAU on 9/16 for contractions. Irregular contractions occasionally - Anticipatory guidance for upcoming appointments  - Tdap vaccine greater than or equal to 7yo IM  2. [redacted] weeks  gestation of pregnancy - FH appropriate - Endorses normal, active fetal movement  3. Echogenic bowel of fetus on prenatal ultrasound - F/u US on 9/28  4. HSIL (high grade squamous intraepithelial lesion) on Pap smear of cervix - PP colpo  5. Bipolar 1 disorder (HCC) - Stable   Preterm labor symptoms and general obstetric precautions including but not limited to vaginal bleeding, contractions, leaking of fluid and fetal movement were reviewed in detail with the patient. Please refer to After Visit Summary for other counseling recommendations.   Return in about 2 weeks (around 05/12/2022) for LOB.  Future Appointments  Date Time Provider [place]  05/07/2022  9:15 AM [facility] NURSE [facility]  05/07/2022  9:30 AM [facility] US3 [facility]  05/12/2022  4:10 PM Renee Harder, CNM [facility]  05/26/2022 10:30 AM Renee Harder, CNM [facility]  06/09/2022 10:30 AM Rasch, Artist Pais, NP [facility]  06/16/2022 10:30 AM Renee Harder, CNM [facility]  06/23/2022 10:30 AM Rasch, Artist Pais, NP [facility]    Renee Harder, CNM

## 2022-04-30 LAB — BILE ACIDS, PREGNANCY
CHENODEOXYCHOLIC ACID: 0.7 umol/L (ref <=3.9)
CHOLIC ACID: <0.5 umol/L (ref <=2.8)
DEOXYCHOLIC ACID: 1.4 umol/L (ref <=2.3)
TOTAL BILE ACIDS: 2.1 umol/L (ref <=8.3)

## 2022-05-07 ENCOUNTER — Ambulatory Visit: Payer: Medicaid Other | Admitting: *Deleted

## 2022-05-07 ENCOUNTER — Ambulatory Visit: Payer: Medicaid Other | Attending: Obstetrics and Gynecology

## 2022-05-07 VITALS — BP 104/55 | HR 75

## 2022-05-07 DIAGNOSIS — O283 Abnormal ultrasonic finding on antenatal screening of mother: Secondary | ICD-10-CM | POA: Insufficient documentation

## 2022-05-07 DIAGNOSIS — Z148 Genetic carrier of other disease: Secondary | ICD-10-CM

## 2022-05-07 DIAGNOSIS — O35BXX0 Maternal care for other (suspected) fetal abnormality and damage, fetal cardiac anomalies, not applicable or unspecified: Secondary | ICD-10-CM | POA: Diagnosis not present

## 2022-05-07 DIAGNOSIS — Z3A32 32 weeks gestation of pregnancy: Secondary | ICD-10-CM | POA: Diagnosis not present

## 2022-05-07 DIAGNOSIS — Z3689 Encounter for other specified antenatal screening: Secondary | ICD-10-CM | POA: Insufficient documentation

## 2022-05-07 DIAGNOSIS — O285 Abnormal chromosomal and genetic finding on antenatal screening of mother: Secondary | ICD-10-CM

## 2022-05-07 DIAGNOSIS — O280 Abnormal hematological finding on antenatal screening of mother: Secondary | ICD-10-CM | POA: Diagnosis not present

## 2022-05-07 DIAGNOSIS — O35DXX0 Maternal care for other (suspected) fetal abnormality and damage, fetal gastrointestinal anomalies, not applicable or unspecified: Secondary | ICD-10-CM | POA: Insufficient documentation

## 2022-05-12 ENCOUNTER — Ambulatory Visit (INDEPENDENT_AMBULATORY_CARE_PROVIDER_SITE_OTHER): Payer: Medicaid Other

## 2022-05-12 VITALS — BP 105/65 | HR 80 | Wt 156.0 lb

## 2022-05-12 DIAGNOSIS — R87613 High grade squamous intraepithelial lesion on cytologic smear of cervix (HGSIL): Secondary | ICD-10-CM

## 2022-05-12 DIAGNOSIS — Z3480 Encounter for supervision of other normal pregnancy, unspecified trimester: Secondary | ICD-10-CM

## 2022-05-12 DIAGNOSIS — Z3483 Encounter for supervision of other normal pregnancy, third trimester: Secondary | ICD-10-CM

## 2022-05-12 DIAGNOSIS — Z3A32 32 weeks gestation of pregnancy: Secondary | ICD-10-CM

## 2022-05-12 DIAGNOSIS — O283 Abnormal ultrasonic finding on antenatal screening of mother: Secondary | ICD-10-CM

## 2022-05-12 NOTE — Progress Notes (Signed)
   PRENATAL VISIT NOTE  Subjective:  Julie Wilkinson is a 26 y.o. [id number] at [redacted]w[redacted]d being seen today for ongoing prenatal care.  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  Patient reports no complaints.  Contractions: Not present. Vag. Bleeding: None.  Movement: Present. Denies leaking of fluid.   The following portions of the patient's history were reviewed and updated as appropriate: allergies, current medications, past family history, past medical history, past social history, past surgical history and problem list.   Objective:   Vitals:   05/12/22 1613  BP: 105/65  Pulse: 80  Weight: 156 lb (70.8 kg)    Fetal Status: Fetal Heart Rate (bpm): 139   Movement: Present     General:  Alert, oriented and cooperative. Patient is in no acute distress.  Skin: Skin is warm and dry. No rash noted.   Cardiovascular: Normal heart rate noted  Respiratory: Normal respiratory effort, no problems with respiration noted  Abdomen: Soft, gravid, appropriate for gestational age.  Pain/Pressure: Absent     Pelvic: Cervical exam deferred        Extremities: Normal range of motion.  Edema: None  Mental Status: Normal mood and affect. Normal behavior. Normal judgment and thought content.   Assessment and Plan:  Pregnancy: [id number] at [redacted]w[redacted]d 1. Supervision of other normal pregnancy, antepartum - Routine OB. Doing well, no concerns - Discussed contraception. Patient desires LARC  - Anticipatory guidance for upcoming appointments provided  2. [redacted] weeks gestation of pregnancy - FH appropriate - Endorses active fetal movement  3. Echogenic bowel  of fetus on prenatal ultrasound - Resolved  4. HSIL (high grade squamous intraepithelial lesion) on Pap smear of cervix - PP colpo   Preterm labor symptoms and general obstetric precautions including but not limited to vaginal bleeding, contractions, leaking of fluid and fetal movement were reviewed in detail with the patient. Please refer to After Visit Summary for other counseling recommendations.   Return in about 2 weeks (around 05/26/2022) for LOB.  Future Appointments  Date Time Provider [place]  05/26/2022 10:30 AM Renee Harder, CNM [facility]  06/09/2022 10:30 AM Rasch, Artist Pais, NP [facility]  06/16/2022 10:30 AM Renee Harder, CNM [facility]  06/23/2022 10:30 AM Rasch, Artist Pais, NP [facility]    Renee Harder, CNM

## 2022-05-26 ENCOUNTER — Ambulatory Visit (INDEPENDENT_AMBULATORY_CARE_PROVIDER_SITE_OTHER): Payer: Medicaid Other

## 2022-05-26 VITALS — BP 111/73 | HR 88 | Wt 158.0 lb

## 2022-05-26 DIAGNOSIS — Z3A34 34 weeks gestation of pregnancy: Secondary | ICD-10-CM

## 2022-05-26 DIAGNOSIS — Z3480 Encounter for supervision of other normal pregnancy, unspecified trimester: Secondary | ICD-10-CM

## 2022-05-26 DIAGNOSIS — Z3483 Encounter for supervision of other normal pregnancy, third trimester: Secondary | ICD-10-CM

## 2022-05-26 NOTE — Progress Notes (Signed)
   PRENATAL VISIT NOTE  Subjective:  Julie Wilkinson is a 26 y.o. [id number] at [redacted]w[redacted]d being seen today for ongoing prenatal care.  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  Patient reports  contractions on and off for the past 2-3 days along with increased pelvic pressure .  Contractions: Irregular. Vag. Bleeding: None.  Movement: Present. Denies leaking of fluid.   The following portions of the patient's history were reviewed and updated as appropriate: allergies, current medications, past family history, past medical history, past social history, past surgical history and problem list.   Objective:   Vitals:   05/26/22 1039  BP: 111/73  Pulse: 88  Weight: 158 lb (71.7 kg)    Fetal Status: Fetal Heart Rate (bpm): 141 Fundal Height: 34 cm Movement: Present     General:  Alert, oriented and cooperative. Patient is in no acute distress.  Skin: Skin is warm and dry. No rash noted.   Cardiovascular: Normal heart rate noted  Respiratory: Normal respiratory effort, no problems with respiration noted  Abdomen: Soft, gravid, appropriate for gestational age.  Pain/Pressure: Present     Pelvic: Cervical exam performed in the presence of a chaperone Dilation: Closed Effacement (%): Thick    Extremities: Normal range of motion.  Edema: None  Mental Status: Normal mood and affect. Normal behavior. Normal judgment and thought content.   Assessment and Plan:  Pregnancy: [id number] at [redacted]w[redacted]d 1. Supervision of other normal pregnancy, antepartum - Routine OB. Doing well - Irregular contractions and pelvic pressure. Cervix closed/thick. Increase  PO hydration, rest prn. Strict preterm labor precautions reviewed - Cultures next visit - Patient desires elective IOL at 39 weeks due to childcare. Will schedule next visit  2. [redacted] weeks gestation of pregnancy - FH appropriate - Endorses active fetal movement   Preterm labor symptoms and general obstetric precautions including but not limited to vaginal bleeding, contractions, leaking of fluid and fetal movement were reviewed in detail with the patient. Please refer to After Visit Summary for other counseling recommendations.   Return in about 2 weeks (around 06/09/2022) for LOB.  Future Appointments  Date Time Provider [place]  06/09/2022 10:30 AM Rasch, Artist Pais, NP [facility]  06/16/2022 10:30 AM Renee Harder, CNM [facility]  06/23/2022 10:30 AM Rasch, Artist Pais, NP [facility]    Renee Harder, CNM

## 2022-05-26 NOTE — Progress Notes (Signed)
Pt having increased pressure and irregular contractions

## 2022-06-09 ENCOUNTER — Other Ambulatory Visit (HOSPITAL_COMMUNITY)
Admission: RE | Admit: 2022-06-09 | Discharge: 2022-06-09 | Disposition: A | Discharge status: Home / Self Care | Payer: Medicaid Other | Source: Ambulatory Visit | Attending: Obstetrics and Gynecology | Admitting: Obstetrics and Gynecology

## 2022-06-09 ENCOUNTER — Ambulatory Visit (INDEPENDENT_AMBULATORY_CARE_PROVIDER_SITE_OTHER): Payer: Medicaid Other | Admitting: Obstetrics and Gynecology

## 2022-06-09 VITALS — BP 104/69 | HR 90 | Wt 162.0 lb

## 2022-06-09 DIAGNOSIS — Z3483 Encounter for supervision of other normal pregnancy, third trimester: Secondary | ICD-10-CM

## 2022-06-09 DIAGNOSIS — Z3480 Encounter for supervision of other normal pregnancy, unspecified trimester: Secondary | ICD-10-CM

## 2022-06-09 DIAGNOSIS — Z3A36 36 weeks gestation of pregnancy: Secondary | ICD-10-CM | POA: Diagnosis not present

## 2022-06-09 NOTE — Progress Notes (Signed)
   PRENATAL VISIT NOTE  Subjective:  Julie Wilkinson is a 26 y.o. [id number] at [redacted]w[redacted]d being seen today for ongoing prenatal care.  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  Patient reports no complaints.  Contractions: Irregular. Vag. Bleeding: None.  Movement: Present. Denies leaking of fluid.   The following portions of the patient's history were reviewed and updated as appropriate: allergies, current medications, past family history, past medical history, past social history, past surgical history and problem list.   Objective:   Vitals:   06/09/22 1037  BP: 104/69  Pulse: 90  Weight: 162 lb (73.5 kg)    Fetal Status: Fetal Heart Rate (bpm): 138 Fundal Height: 37 cm Movement: Present     General:  Alert, oriented and cooperative. Patient is in no acute distress.  Skin: Skin is warm and dry. No rash noted.   Cardiovascular: Normal heart rate noted  Respiratory: Normal respiratory effort, no problems with respiration noted  Abdomen: Soft, gravid, appropriate for gestational age.  Pain/Pressure: Present     Pelvic: Cervical exam deferred Dilation: 1 Effacement (%): Thick Station: Ballotable  Extremities: Normal range of motion.  Edema: None  Mental Status: Normal mood and affect. Normal behavior. Normal judgment and thought content.   Assessment and Plan:  Pregnancy: [id number] at [redacted]w[redacted]d  1. Supervision of other normal pregnancy, antepartum  - Cervicovaginal ancillary only( [place]) - Culture, beta strep (group b only)  - Patient request induction @ 39 weeks. Discussed the potential for delay given medical inductions will  be first priority if inductions are waiting. She is agreeable.  - Induction orders placed   Preterm labor symptoms and general obstetric precautions including but not limited to vaginal bleeding, contractions, leaking of fluid and fetal movement were reviewed in detail with the patient. Please refer to After Visit Summary for other counseling recommendations.   No follow-ups on file.  Future Appointments  Date Time Provider [place]  06/16/2022 10:30 AM Renee Harder, CNM [facility]  06/23/2022 10:30 AM Steel Kerney, Artist Pais, NP [facility]    Noni Saupe, NP

## 2022-06-10 LAB — CERVICOVAGINAL ANCILLARY ONLY
Chlamydia: NEGATIVE
Comment: NEGATIVE
Comment: NORMAL
Neisseria Gonorrhea: NEGATIVE

## 2022-06-12 LAB — CULTURE, BETA STREP (GROUP B ONLY)
MICRO NUMBER:: 14124458
SPECIMEN QUALITY:: ADEQUATE

## 2022-06-13 ENCOUNTER — Inpatient Hospital Stay (HOSPITAL_BASED_OUTPATIENT_CLINIC_OR_DEPARTMENT_OTHER): Payer: Medicaid Other

## 2022-06-13 ENCOUNTER — Inpatient Hospital Stay (HOSPITAL_COMMUNITY)
Admission: AD | Admit: 2022-06-13 | Discharge: 2022-06-13 | Disposition: A | Discharge status: Home / Self Care | Payer: Medicaid Other | Attending: Certified Nurse Midwife | Admitting: Certified Nurse Midwife

## 2022-06-13 ENCOUNTER — Encounter (HOSPITAL_COMMUNITY): Payer: Self-pay | Admitting: Obstetrics and Gynecology

## 2022-06-13 DIAGNOSIS — O4693 Antepartum hemorrhage, unspecified, third trimester: Secondary | ICD-10-CM | POA: Diagnosis not present

## 2022-06-13 DIAGNOSIS — O471 False labor at or after 37 completed weeks of gestation: Secondary | ICD-10-CM | POA: Diagnosis not present

## 2022-06-13 DIAGNOSIS — Z3A37 37 weeks gestation of pregnancy: Secondary | ICD-10-CM | POA: Diagnosis not present

## 2022-06-13 DIAGNOSIS — O479 False labor, unspecified: Secondary | ICD-10-CM | POA: Diagnosis not present

## 2022-06-13 DIAGNOSIS — Z3689 Encounter for other specified antenatal screening: Secondary | ICD-10-CM

## 2022-06-13 NOTE — MAU Provider Note (Addendum)
History    CSN: [id number]  Arrival date and time: 06/13/22 0430  Chief Complaint  Patient presents with   Vaginal Bleeding   HPI Julie Wilkinson is a 26 y.o. [id number] at [redacted]w[redacted]d who presents to MAU for vaginal bleeding. She reports around 3am she went to the bathroom and noticed bright red blood in the toilet. Bleeding has since slowed down and is spotting. She reports around 7pm she had a lot of intense but not regular contractions. Contractions are still present and remain irregular. No recent intercourse. Cervix was checked several days ago and she was 1cm. She denies leaking fluid. Endorses active fetal movement.  Patient receives [facility] at [facility] and next appointment is scheduled on 11/7.  OB History     Gravida  5   Para  2   Term  2   Preterm      AB  2   Living  2      SAB  1   IAB  1   Ectopic      Multiple      Live Births  2          Past Medical History:  Diagnosis Date   Anxiety 2017   Past Surgical History:  Procedure Laterality Date   HYMENECTOMY  26 years old   Family History  Problem Relation Age of Onset   Bipolar disorder Mother    Depression Father    Bipolar disorder Sister    Asthma Neg Hx    Cancer Neg Hx    Diabetes Neg Hx    Heart disease Neg Hx    Hypertension Neg Hx    Stroke Neg Hx    Social History   Tobacco Use   Smoking status: Former    Types: Cigarettes    Quit date: 2015    Years since quitting: 8.8   Smokeless tobacco: Never  Vaping Use   Vaping Use: Never used  Substance Use Topics   Alcohol use: Not Currently   Drug use: No   Allergies:  Allergies  Allergen Reactions   Loracarbef Hives   Medications Prior to Admission  Medication Sig Dispense Refill Last Dose   acetaminophen (TYLENOL) 325 MG tablet Take 2 tablets (650 mg total) by mouth every 4 (four) hours as needed (for pain scale < 4). 30 tablet 0    promethazine (PHENERGAN) 25 MG tablet Take 1 tablet (25 mg total) by mouth every 6 (six) hours as  needed for nausea or vomiting. 30 tablet 1    ursodiol (ACTIGALL) 300 MG capsule Take 1 capsule (300 mg total) by mouth 2 (two) times daily. (Patient not taking: Reported on 05/26/2022) 14 capsule 0    Review of Systems  Constitutional: Negative.   Respiratory: Negative.    Cardiovascular: Negative.   Gastrointestinal:  Positive for abdominal pain (contractions).  Genitourinary:  Positive for vaginal bleeding.  Neurological: Negative.    Physical Exam   Blood pressure 107/60, pulse 81, temperature 98.4 F (36.9 C), temperature source Oral, resp. rate 18, height 5\' 4"  (1.626 m), weight 72.8 kg, last menstrual period 09/25/2021, SpO2 98 %.  Physical Exam Vitals and nursing note reviewed. Exam conducted with a chaperone present.  Constitutional:      General: She is not in acute distress. Eyes:     Extraocular Movements: Extraocular movements intact.     Pupils: Pupils are equal, round, and reactive to light.  Cardiovascular:     Rate and Rhythm: Normal rate.  Pulmonary:     Effort: Pulmonary effort is normal.  Abdominal:     Palpations: Abdomen is soft.     Tenderness: There is no abdominal tenderness.  Genitourinary:    Comments: Normal external female genitalia, vaginal walls pink with rugae, scant amount of mucousy red discharge, no active bleeding, no pooling of amniotic fluid, cervix visually 1-2cm without lesions/masses Musculoskeletal:        General: Normal range of motion.     Cervical back: Normal range of motion.  Skin:    General: Skin is warm and dry.  Neurological:     General: No focal deficit present.     Mental Status: She is alert and oriented to person, place, and time.  Psychiatric:        Mood and Affect: Mood normal.        Behavior: Behavior normal.  Dilation: 3 Effacement (%): 50 Cervical Position: Posterior Station: -3 Presentation: Vertex Exam by:: Maryagnes Amos, CNM  NST FHR: 145 bpm, moderate variability. +15x15 accels, 1 small decel  when patient first placed on monitor Toco: occasional  MAU Course  Procedures  MDM Speculum exam without evidence of active bleeding or pooling of amniotic fluid. There is a small amount of red mucoid tinged discharge c/w bloody show. Cervix 2.5/50/-3, vtx. Plan to recheck cervix after 1-2 hours. NST reactive and reassuring after single decel that occurred as patient was placed on monitor.  0650: Patient reporting contractions every few minutes that are getting more painful. Cervix 3/50/-3, vtx. No blood noted on glove. NST reactive and reassuring, toco shows contractions every 2-5 minutes. Patient was offered to be rechecked in 1 hour or discharge home with strict return precautions. Patient opts to stay an additional hour.   0750: NST 155bpm, moderate variability, +15x15 accels, +variable decelerations with contractions. Cervix unchanged. Again no blood on glove. Will send patient to ultrasound for BPP and AFI.   0800: Care handed over to [facility], [place], CNM 06/13/22 8:12 AM  BPP 8/8 with improved reactivity on monitor and no further decels. Contractions still painful but less frequent. Pt expressed concerns about going home still having painful contractions so encouraged her to walk and see if her cervix changed.  Cervix still unchanged, contractions have slowed (albeit still painful) and NST remains reactive. Stable for discharge home.   Assessment and Plan  False labor NST reactive [redacted] weeks gestation of pregnancy  Discharge home in stable condition with term labor precautions Follow up at [facility] as scheduled for ongoing prenatal care  Gaylan Gerold, CNM, MSN, [place] Certified Nurse Midwife, [place]

## 2022-06-13 NOTE — MAU Note (Signed)
..  Julie Wilkinson is a 26 y.o. at [redacted]w[redacted]d here in MAU reporting: Was having contractions around 7pm and then she went to bed and then woke up around 3am to use the restroom and had some vaginal bleeding in toilet and on toilet paper.  Reports she is starting to feel contractions on the way to the hospital. denies leaking of fluid. +FM  Pain score: 6/10 FHT:144

## 2022-06-14 ENCOUNTER — Inpatient Hospital Stay (HOSPITAL_COMMUNITY)
Admission: AD | Admit: 2022-06-14 | Discharge: 2022-06-14 | Disposition: A | Discharge status: Home / Self Care | Payer: Medicaid Other | Attending: Obstetrics and Gynecology | Admitting: Obstetrics and Gynecology

## 2022-06-14 ENCOUNTER — Other Ambulatory Visit: Payer: Self-pay

## 2022-06-14 ENCOUNTER — Encounter (HOSPITAL_COMMUNITY): Payer: Self-pay | Admitting: Obstetrics and Gynecology

## 2022-06-14 DIAGNOSIS — Z3A37 37 weeks gestation of pregnancy: Secondary | ICD-10-CM | POA: Insufficient documentation

## 2022-06-14 DIAGNOSIS — O471 False labor at or after 37 completed weeks of gestation: Secondary | ICD-10-CM

## 2022-06-14 DIAGNOSIS — Z3480 Encounter for supervision of other normal pregnancy, unspecified trimester: Secondary | ICD-10-CM

## 2022-06-14 DIAGNOSIS — O368130 Decreased fetal movements, third trimester, not applicable or unspecified: Secondary | ICD-10-CM | POA: Insufficient documentation

## 2022-06-14 NOTE — MAU Note (Signed)
Julie Wilkinson is a 26 y.o. at [redacted]w[redacted]d here in MAU reporting: ctxs that are 2-5 minutes apart since this morning.  Denies VB, has some bloody show.  Denies LOF.  Reports decreased FM too.   States was seen in MAU yesterday for labor eval, cervix 3 cms. LMP: NA Onset of complaint: today Pain score: 5 Vitals:   06/14/22 1426  BP: 112/70  Pulse: 92  Resp: 20  Temp: 98.3 F (36.8 C)  SpO2: 97%     FHT:145 Lab orders placed from triage:   None

## 2022-06-14 NOTE — MAU Provider Note (Signed)
History     CSN: [id number]  Arrival date and time: 06/14/22 1356   Event Date/Time   First Provider Initiated Contact with Patient 06/14/22 1434      Chief Complaint  Patient presents with   Decreased Fetal Movement   Contractions   HPI Patient is a 26 year old G5 P2-0-2-2 at [redacted]w[redacted]d presenting for contractions and decreased fetal movement.  She was seen yesterday in the MAU for vaginal bleeding.  Reports that since being discharged she has noticed a little bit of brown bloody discharge but no worse than yesterday.  She reports that she has noticed her contractions but is not sure how frequent they are occurring.  Yesterday on evaluation her cervix was 3 cm dilated.  Denies any loss of fluid.  OB History     Gravida  5   Para  2   Term  2   Preterm      AB  2   Living  2      SAB  1   IAB  1   Ectopic      Multiple      Live Births  2           Past Medical History:  Diagnosis Date   Anxiety 2017    Past Surgical History:  Procedure Laterality Date   HYMENECTOMY  26 years old    Family History  Problem Relation Age of Onset   Bipolar disorder Mother    Depression Father    Bipolar disorder Sister    Asthma Neg Hx    Cancer Neg Hx    Diabetes Neg Hx    Heart disease Neg Hx    Hypertension Neg Hx    Stroke Neg Hx     Social History   Tobacco Use   Smoking status: Former    Types: Cigarettes    Quit date: 2015    Years since quitting: 8.8   Smokeless tobacco: Never  Vaping Use   Vaping Use: Never used  Substance Use Topics   Alcohol use: Not Currently   Drug use: No    Allergies:  Allergies  Allergen Reactions   Loracarbef Hives    Medications Prior to Admission  Medication Sig Dispense Refill Last Dose   acetaminophen (TYLENOL) 325 MG tablet Take 2 tablets (650 mg total) by mouth every 4 (four) hours as needed (for pain scale < 4). 30 tablet 0 Past Week   promethazine (PHENERGAN) 25 MG tablet Take 1 tablet (25 mg total) by  mouth every 6 (six) hours as needed for nausea or vomiting. 30 tablet 1 Past Month   ursodiol (ACTIGALL) 300 MG capsule Take 1 capsule (300 mg total) by mouth 2 (two) times daily. (Patient not taking: Reported on 05/26/2022) 14 capsule 0     Review of Systems  Gastrointestinal:  Positive for abdominal pain (With contractions).  Genitourinary:  Positive for vaginal bleeding.  All other systems reviewed and are negative.  Physical Exam   Blood pressure 108/65, pulse 81, temperature 98.3 F (36.8 C), temperature source Oral, resp. rate 20, last menstrual period 09/25/2021, SpO2 97 %.  Physical Exam Vitals reviewed.  Constitutional:      Appearance: Normal appearance.  HENT:     Head: Normocephalic and atraumatic.  Eyes:     Extraocular Movements: Extraocular movements intact.  Cardiovascular:     Rate and Rhythm: Normal rate.     Pulses: Normal pulses.  Pulmonary:     Effort: Pulmonary effort  is normal.  Abdominal:     Comments: Gravid  Musculoskeletal:        General: Normal range of motion.  Skin:    General: Skin is warm.     Capillary Refill: Capillary refill takes less than 2 seconds.  Neurological:     General: No focal deficit present.     Mental Status: She is alert.  Psychiatric:        Mood and Affect: Mood normal.     MAU Course  Procedures  MDM NST Cervical exams Initial cervical exam 3, thick, high  1545-reevaluated repeated cervical exam and cervical exam unchanged, planning on discharging but contractions got closer together every 2 to 3 minutes.  Patient elected to be monitored for another hour.  16 45-reevaluated cervical exam and was unchanged.  Contractions intermittently occurred every 2 to 3 minutes and then was spaced out to every 5 minutes.   Assessment and Plan  Patient is a 26 year old G5 P2-0-2-2 at 37 weeks 3 days presenting complaining of contractions and decreased fetal movement.  NST was reassuring with category 1 strip.  Contractions  initially every 5 to 7 minutes.  After cervical exam contractions came closer together.  On 3 evaluations there was no cervical change.  Discussed strict return precautions and patient discharged home.  Celedonio Savage 06/14/2022, 3:08 PM

## 2022-06-14 NOTE — Discharge Instructions (Signed)
It was great taking care of you today.  You were contracting when you got here but we monitored and you did not make any cervical change.  If these contractions get stronger or remain close together and you feel more pressure please be reevaluated.  If you have any leaking of fluid, vaginal bleeding and also like you to be reevaluated.  I hope you have a wonderful afternoon!

## 2022-06-16 ENCOUNTER — Ambulatory Visit (INDEPENDENT_AMBULATORY_CARE_PROVIDER_SITE_OTHER): Payer: Medicaid Other

## 2022-06-16 ENCOUNTER — Inpatient Hospital Stay (HOSPITAL_COMMUNITY)
Admission: RE | Admit: 2022-06-16 | Discharge: 2022-06-18 | DRG: 807 | Disposition: A | Discharge status: Home / Self Care | Payer: Medicaid Other | Attending: Family Medicine | Admitting: Family Medicine

## 2022-06-16 ENCOUNTER — Inpatient Hospital Stay (HOSPITAL_COMMUNITY): Payer: Medicaid Other | Admitting: Anesthesiology

## 2022-06-16 ENCOUNTER — Other Ambulatory Visit: Payer: Self-pay

## 2022-06-16 ENCOUNTER — Encounter (HOSPITAL_COMMUNITY): Payer: Self-pay | Admitting: Family Medicine

## 2022-06-16 VITALS — BP 106/71 | HR 77 | Wt 162.0 lb

## 2022-06-16 DIAGNOSIS — Z3483 Encounter for supervision of other normal pregnancy, third trimester: Secondary | ICD-10-CM

## 2022-06-16 DIAGNOSIS — O326XX1 Maternal care for compound presentation, fetus 1: Secondary | ICD-10-CM | POA: Diagnosis not present

## 2022-06-16 DIAGNOSIS — O9081 Anemia of the puerperium: Secondary | ICD-10-CM | POA: Diagnosis not present

## 2022-06-16 DIAGNOSIS — Z148 Genetic carrier of other disease: Secondary | ICD-10-CM

## 2022-06-16 DIAGNOSIS — Z3A37 37 weeks gestation of pregnancy: Secondary | ICD-10-CM

## 2022-06-16 DIAGNOSIS — Z3480 Encounter for supervision of other normal pregnancy, unspecified trimester: Principal | ICD-10-CM

## 2022-06-16 DIAGNOSIS — Z87891 Personal history of nicotine dependence: Secondary | ICD-10-CM | POA: Diagnosis not present

## 2022-06-16 DIAGNOSIS — Z3490 Encounter for supervision of normal pregnancy, unspecified, unspecified trimester: Secondary | ICD-10-CM | POA: Diagnosis not present

## 2022-06-16 DIAGNOSIS — O26893 Other specified pregnancy related conditions, third trimester: Secondary | ICD-10-CM | POA: Diagnosis not present

## 2022-06-16 DIAGNOSIS — O4292 Full-term premature rupture of membranes, unspecified as to length of time between rupture and onset of labor: Secondary | ICD-10-CM | POA: Diagnosis not present

## 2022-06-16 LAB — CBC
HCT: 33.1 % — ABNORMAL LOW (ref 36.0–46.0)
Hemoglobin: 10.8 g/dL — ABNORMAL LOW (ref 12.0–15.0)
MCH: 28.7 pg (ref 26.0–34.0)
MCHC: 32.6 g/dL (ref 30.0–36.0)
MCV: 88 fL (ref 80.0–100.0)
Platelets: 188 10*3/uL (ref 150–400)
RBC: 3.76 MIL/uL — ABNORMAL LOW (ref 3.87–5.11)
RDW: 12.8 % (ref 11.5–15.5)
WBC: 11.9 10*3/uL — ABNORMAL HIGH (ref 4.0–10.5)
nRBC: 0 % (ref 0.0–0.2)

## 2022-06-16 LAB — TYPE AND SCREEN
ABO/RH(D): O POS
Antibody Screen: NEGATIVE

## 2022-06-16 LAB — POCT FERN TEST: POCT Fern Test: POSITIVE

## 2022-06-16 MED ORDER — EPHEDRINE 5 MG/ML INJ: 10.0000 mg | INTRAVENOUS | Status: DC | PRN

## 2022-06-16 MED ORDER — LACTATED RINGERS IV SOLN
500.0000 mL | Freq: Once | INTRAVENOUS | Status: AC
  Administered 2022-06-16: 500 mL via INTRAVENOUS

## 2022-06-16 MED ORDER — LACTATED RINGERS IV SOLN: INTRAVENOUS | Status: DC

## 2022-06-16 MED ORDER — PHENYLEPHRINE 80 MCG/ML (10ML) SYRINGE FOR IV PUSH (FOR BLOOD PRESSURE SUPPORT)
80.0000 ug | PREFILLED_SYRINGE | INTRAVENOUS | Status: DC | PRN
  Filled 2022-06-16: qty 10

## 2022-06-16 MED ORDER — FENTANYL-BUPIVACAINE-NACL 0.5-0.125-0.9 MG/250ML-% EP SOLN
12.0000 mL/h | EPIDURAL | Status: DC | PRN
  Filled 2022-06-16: qty 250

## 2022-06-16 MED ORDER — OXYTOCIN BOLUS FROM INFUSION
333.0000 mL | Freq: Once | INTRAVENOUS | Status: AC
  Administered 2022-06-16: 333 mL via INTRAVENOUS

## 2022-06-16 MED ORDER — OXYTOCIN-SODIUM CHLORIDE 30-0.9 UT/500ML-% IV SOLN
2.5000 [IU]/h | INTRAVENOUS | Status: DC
  Administered 2022-06-16: 2.5 [IU]/h via INTRAVENOUS
  Filled 2022-06-16: qty 500

## 2022-06-16 MED ORDER — LIDOCAINE HCL (PF) 1 % IJ SOLN: 30.0000 mL | INTRAMUSCULAR | Status: DC | PRN

## 2022-06-16 MED ORDER — TERBUTALINE SULFATE 1 MG/ML IJ SOLN: 0.2500 mg | Freq: Once | INTRAMUSCULAR | Status: AC

## 2022-06-16 MED ORDER — TERBUTALINE SULFATE 1 MG/ML IJ SOLN
INTRAMUSCULAR | Status: AC
  Administered 2022-06-16: 0.25 mg via SUBCUTANEOUS
  Filled 2022-06-16: qty 1

## 2022-06-16 MED ORDER — FENTANYL-BUPIVACAINE-NACL 0.5-0.125-0.9 MG/250ML-% EP SOLN
EPIDURAL | Status: DC | PRN
  Administered 2022-06-16: 12 mL/h via EPIDURAL

## 2022-06-16 MED ORDER — OXYCODONE-ACETAMINOPHEN 5-325 MG PO TABS: 2.0000 | ORAL_TABLET | ORAL | Status: DC | PRN

## 2022-06-16 MED ORDER — ACETAMINOPHEN 325 MG PO TABS: 650.0000 mg | ORAL_TABLET | ORAL | Status: DC | PRN

## 2022-06-16 MED ORDER — DIPHENHYDRAMINE HCL 50 MG/ML IJ SOLN: 12.5000 mg | INTRAMUSCULAR | Status: DC | PRN

## 2022-06-16 MED ORDER — PHENYLEPHRINE 80 MCG/ML (10ML) SYRINGE FOR IV PUSH (FOR BLOOD PRESSURE SUPPORT): 80.0000 ug | PREFILLED_SYRINGE | INTRAVENOUS | Status: DC | PRN

## 2022-06-16 MED ORDER — OXYCODONE-ACETAMINOPHEN 5-325 MG PO TABS: 1.0000 | ORAL_TABLET | ORAL | Status: DC | PRN

## 2022-06-16 MED ORDER — LIDOCAINE HCL (PF) 1 % IJ SOLN
INTRAMUSCULAR | Status: DC | PRN
  Administered 2022-06-16: 5 mL via EPIDURAL

## 2022-06-16 MED ORDER — LACTATED RINGERS IV SOLN: 500.0000 mL | INTRAVENOUS | Status: DC | PRN

## 2022-06-16 MED ORDER — ONDANSETRON HCL 4 MG/2ML IJ SOLN: 4.0000 mg | Freq: Four times a day (QID) | INTRAMUSCULAR | Status: DC | PRN

## 2022-06-16 MED ORDER — SOD CITRATE-CITRIC ACID 500-334 MG/5ML PO SOLN: 30.0000 mL | ORAL | Status: DC | PRN

## 2022-06-16 NOTE — Anesthesia Preprocedure Evaluation (Addendum)
Anesthesia Evaluation  Patient identified by MRN, date of birth, ID band Patient awake    Reviewed: Allergy & Precautions, NPO status , Patient's Chart, lab work & pertinent test results  Airway Mallampati: II  TM Distance: >3 FB Neck ROM: Full    Dental no notable dental hx. (+) Teeth Intact, Dental Advisory Given   Pulmonary former smoker   Pulmonary exam normal breath sounds clear to auscultation       Cardiovascular Exercise Tolerance: Good Normal cardiovascular exam Rhythm:Regular Rate:Normal     Neuro/Psych    GI/Hepatic negative GI ROS, Neg liver ROS,,,  Endo/Other  negative endocrine ROS    Renal/GU negative Renal ROS     Musculoskeletal   Abdominal   Peds  Hematology Lab Results      Component                Value               Date                          HGB                      10.8 (L)            06/16/2022                HCT                      33.1 (L)            06/16/2022                PLT                      188                 06/16/2022              Anesthesia Other Findings All: Loracarbef  Reproductive/Obstetrics (+) Pregnancy                             Anesthesia Physical Anesthesia Plan  ASA: 2  Anesthesia Plan: Epidural   Post-op Pain Management:    Induction:   PONV Risk Score and Plan:   Airway Management Planned:   Additional Equipment:   Intra-op Plan:   Post-operative Plan:   Informed Consent: I have reviewed the patients History and Physical, chart, labs and discussed the procedure including the risks, benefits and alternatives for the proposed anesthesia with the patient or authorized representative who has indicated his/her understanding and acceptance.       Plan Discussed with:   Anesthesia Plan Comments: (37.5 Wk G5P2 For LEA)       Anesthesia Quick Evaluation

## 2022-06-16 NOTE — H&P (Cosign Needed Addendum)
OBSTETRIC ADMISSION HISTORY AND PHYSICAL  Julie Wilkinson is a 26 y.o. female [phone] with IUP at [redacted]w[redacted]d by LMP presenting in labor. She reports large clear fluid gush at 1645 today with positive ferning in MAU. She was checked in the office today and was 3cm dilated. Reports +FM, no VB, no blurry vision, headaches or peripheral edema, and RUQ pain.  She plans on breast feeding. She is undecided on birth control but declines ppIUD.   She received her prenatal care at  [facility] [place]    Dating: By LMP --->  Estimated Date of Delivery: 07/02/22  Sono:   @[redacted]w[redacted]d , CWD. Anatomy scan @ [redacted]w[redacted]d showed echogenic bowel and echogenic intracardiac focus. Infectious w/u for TORCH neg. Repeat [redacted]w[redacted]d @[redacted]w[redacted]d  and @[redacted]w[redacted]d  showed normal interval growth and complete anatomy with most recent EFW 2061g, 67%ile EFW on 9/287/23. 06/13/22 in MAU showing cephalic presentation.   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: LR NIPS/AFP, Nrg/CF neg, TORCH neg, serial growth 12-16-1973 appropriate  Past Medical History: Past Medical History:  Diagnosis Date   Anxiety 2017    Past Surgical History: Past Surgical History:  Procedure Laterality Date   HYMENECTOMY  26 years old    Obstetrical History: OB History     Gravida  5   Para  2   Term  2   Preterm      AB  2   Living  2      SAB  1   IAB  1   Ectopic      Multiple      Live Births  2         G1: female, term, 08/29/14, 7lb8oz G2: first trimester loss G3: female, term, 09/04/19, 6lb 4oz G4: first trimester loss G5: current   Social History Social History   Socioeconomic History   Marital status: Single    Spouse name: Not on file   Number of children: Not on file   Years of education: Not on file   Highest education level: Not on file   Occupational History   Not on file  Tobacco Use   Smoking status: Former    Types: Cigarettes    Quit date: 2015    Years since quitting: 8.8   Smokeless tobacco: Never  Vaping Use   Vaping Use: Never used  Substance and Sexual Activity   Alcohol use: Not Currently   Drug use: No   Sexual activity: Yes    Birth control/protection: None  Other Topics Concern   Not on file  Social History Narrative   Not on file   Social Determinants of Health   Financial Resource Strain: Not on file  Food Insecurity: No Food Insecurity (06/16/2022)   Hunger Vital Sign    Worried About Running Out of Food in the Last Year: Never true    Ran Out of Food in the Last Year: Never true  Transportation Needs: No Transportation Needs (06/16/2022)   PRAPARE - 2016 (Medical): No    Lack of Transportation (Non-Medical): No  Physical Activity: Not on file  Stress: Not on file  Social Connections: Not on file    Family History: Family History  Problem Relation Age of Onset   Bipolar disorder Mother    Depression Father    Bipolar disorder  Sister    Asthma Neg Hx    Cancer Neg Hx    Diabetes Neg Hx    Heart disease Neg Hx    Hypertension Neg Hx    Stroke Neg Hx     Allergies: Allergies  Allergen Reactions   Loracarbef Hives    Medications Prior to Admission  Medication Sig Dispense Refill Last Dose   acetaminophen (TYLENOL) 325 MG tablet Take 2 tablets (650 mg total) by mouth every 4 (four) hours as needed (for pain scale < 4). 30 tablet 0 Past Month   promethazine (PHENERGAN) 25 MG tablet Take 1 tablet (25 mg total) by mouth every 6 (six) hours as needed for nausea or vomiting. 30 tablet 1 Past Month   ursodiol (ACTIGALL) 300 MG capsule Take 1 capsule (300 mg total) by mouth 2 (two) times daily. (Patient not taking: Reported on 05/26/2022) 14 capsule 0     Review of Systems   All systems reviewed and negative except as stated in HPI.  Blood  pressure (!) 114/48, pulse (!) 107, temperature 98.7 F (37.1 C), temperature source Oral, resp. rate 16, last menstrual period 09/25/2021, SpO2 100 %. General appearance: alert, cooperative, and no distress Lungs: clear to auscultation bilaterally Heart: regular rate and rhythm Abdomen: soft, non-tender; bowel sounds normal Pelvic: adequate Extremities: Homans sign is negative, no sign of DVT Presentation: cephalic Fetal monitoringBaseline: 145 bpm, moderate variability, intermittent variable and late decals, no accels. Uterine activityFrequency: Every 1 minutes Dilation: 5 Effacement (%): 90 Station: -1 Exam by:: Knute Neu, CNM Prenatal labs: ABO, Rh: --/--/O POS (11/07 1800) Antibody: NEG (11/07 1800) Rubella: 1.07 (04/25 1023) RPR: NON-REACTIVE (08/24 0847)  HBsAg: NON-REACTIVE (04/25 1023)  HIV: NON-REACTIVE (08/24 0847)  GBS:   neg 1 hr Glucola:  negative  Genetic screening: LR NIPS/AFP, Nrg/CF neg, TORCH neg Anatomy: @[redacted]w[redacted]d  showed echogenic bowel and echogenic intracardiac focus. Infectious w/u for TORCH neg. Repeat US @[redacted]w[redacted]d  and @[redacted]w[redacted]d  showed normal interval growth and complete anatomy with most recent EFW 2061g, 67%ile EFW on 9/287/23. [place] 06/13/22 in MAU showing cephalic presentation.   Prenatal Transfer Tool  Maternal Diabetes: No Genetic Screening: Normal Maternal Ultrasounds/Referrals: Isolated EIF (echogenic intracardiac focus) and Other: Fetal Ultrasounds or other Referrals:  None Maternal Substance Abuse:  No Significant Maternal Medications:  None Significant Maternal Lab Results:  Group B Strep negative Number of Prenatal Visits:greater than 3 verified prenatal visits Other Comments:  None  Results for orders placed or performed during the hospital encounter of 06/16/22 (from the past 24 hour(s))  POCT fern test   Collection Time: 06/16/22  5:39 PM  Result Value Ref Range   POCT Fern Test Positive = ruptured amniotic membanes   Type and screen    Collection Time: 06/16/22  6:00 PM  Result Value Ref Range   ABO/RH(D) O POS    Antibody Screen NEG    Sample Expiration      06/19/2022,2359 Performed at [facility], 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 [id number] at [redacted]w[redacted]d here for labor.  #Labor: in latent labor phase, continue expectant management. S/p terbutaline given tachysystole and NRAT. FWB improved after terb. Discussed possible plan for IUPC for amnioinfusion and better ctx monitoring.  #Pain: epidural #FWB: Cat II given variable and late decels. Improved after terbutaline. Continue to monitor. Plan for c-section consent if continued concern.  #ID:  GBS neg #MOF: breast #MOC: undecided but declines immediately post-placental intervention #Circ:  [id number]  Theo Dills, MD  06/16/2022, 7:38 PM  I spoke with and examined patient and agree with resident/PA-S/MS/SNM's note and plan of care.  Cheral Marker, CNM, [facility] 06/16/2022 8:28 PM

## 2022-06-16 NOTE — Progress Notes (Signed)
Patient ID: Julie Wilkinson, female   DOB: [birth year], 26 y.o.   MRN: [id number] Called to room by RN to assess FHR s/p epidural S: getting comfortable w/ epidural, no complaints O: BP (!) 119/59   Pulse 93   Temp 98.7 F (37.1 C) (Oral)   Resp 16   LMP 09/25/2021   SpO2 100%   SVE: 5/90/-1, vtx Cat 2 FHR, lates after epidural, turning into variables Terb x 1, recommended IUPC/amnio-pt declined right now, wants to wait to see how terb does A: SROM/early labor, Cat 2 FHR P: Notified Dr. Elly Modena of all, will monitor for need for IUPC/amnio, etc Roma Schanz, CNM, [facility] 06/16/2022 1925

## 2022-06-16 NOTE — MAU Provider Note (Signed)
Pt informed that the ultrasound is considered a limited OB ultrasound and is not intended to be a complete ultrasound exam.  Patient also informed that the ultrasound is not being completed with the intent of assessing for fetal or placental anomalies or any pelvic abnormalities.  Explained that the purpose of today's ultrasound is to assess for  presentation.  Patient acknowledges the purpose of the exam and the limitations of the study.     Vertex position.  Lezlie Lye, NP 06/16/2022 5:52 PM

## 2022-06-16 NOTE — Progress Notes (Signed)
   PRENATAL VISIT NOTE  Subjective:  Julie Wilkinson is a 26 y.o. L2G4010 at [redacted]w[redacted]d being seen today for ongoing prenatal care.  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  Patient reports no complaints.  Contractions: Not present. Vag. Bleeding: None.  Movement: Present. Denies leaking of fluid.   The following portions of the patient's history were reviewed and updated as appropriate: allergies, current medications, past family history, past medical history, past social history, past surgical history and problem list.   Objective:   Vitals:   06/16/22 1031  BP: 106/71  Pulse: 77  Weight: 162 lb (73.5 kg)    Fetal Status: Fetal Heart Rate (bpm): 135   Movement: Present  Presentation: Vertex  General:  Alert, oriented and cooperative. Patient is in no acute distress.  Skin: Skin is warm and dry. No rash noted.   Cardiovascular: Normal heart rate noted  Respiratory: Normal respiratory effort, no problems with respiration noted  Abdomen: Soft, gravid, appropriate for gestational age.  Pain/Pressure: Absent     Pelvic: Cervical exam deferred Dilation: 3 Effacement (%): Thick Station: -3  Extremities: Normal range of motion.  Edema: None  Mental Status: Normal mood and affect. Normal behavior. Normal judgment and thought content.   Assessment and Plan:  Pregnancy: [id number] at [redacted]w[redacted]d 1. Supervision of other normal pregnancy, antepartum - Routine OB. Doing well, no concerns - Requesting cervical exam today - Anticipatory guidance for upcoming appointments provided - Has IOL scheduled on 11/18  2. [redacted] weeks gestation of  pregnancy - Endorses active fetal movement  Term labor symptoms and general obstetric precautions including but not limited to vaginal bleeding, contractions, leaking of fluid and fetal movement were reviewed in detail with the patient. Please refer to After Visit Summary for other counseling recommendations.   Return in about 1 week (around 06/23/2022) for LOB.  Future Appointments  Date Time Provider [place]  06/23/2022 10:30 AM Rasch, Artist Pais, NP [facility]  06/27/2022  6:30 AM [facility] [place] None    Renee Harder, CNM

## 2022-06-16 NOTE — Anesthesia Procedure Notes (Signed)
Epidural Patient location during procedure: OB Start time: 06/16/2022 6:47 PM End time: 06/16/2022 7:02 PM  Staffing Anesthesiologist: Barnet Glasgow, MD Performed: anesthesiologist   Preanesthetic Checklist Completed: patient identified, IV checked, site marked, risks and benefits discussed, surgical consent, monitors and equipment checked, pre-op evaluation and timeout performed  Epidural Patient position: sitting Prep: DuraPrep and site prepped and draped Patient monitoring: continuous pulse ox and blood pressure Approach: midline Location: L3-L4 Injection technique: LOR air  Needle:  Needle type: Tuohy  Needle gauge: 17 G Needle length: 9 cm and 9 Needle insertion depth: 7 cm Catheter type: closed end flexible Catheter size: 19 Gauge Catheter at skin depth: 13 cm Test dose: negative  Assessment Events: blood not aspirated, injection not painful, no injection resistance, no paresthesia and negative IV test  Additional Notes Patient identified. Risks/Benefits/Options discussed with patient including but not limited to bleeding, infection, nerve damage, paralysis, failed block, incomplete pain control, headache, blood pressure changes, nausea, vomiting, reactions to medication both or allergic, itching and postpartum back pain. Confirmed with bedside nurse the patient's most recent platelet count. Confirmed with patient that they are not currently taking any anticoagulation, have any bleeding history or any family history of bleeding disorders. Patient expressed understanding and wished to proceed. All questions were answered. Sterile technique was used throughout the entire procedure. Please see nursing notes for vital signs. Test dose was given through epidural needle and negative prior to continuing to dose epidural or start infusion. Warning signs of high block given to the patient including shortness of breath, tingling/numbness in hands, complete motor block, or any  concerning symptoms with instructions to call for help. Patient was given instructions on fall risk and not to get out of bed. All questions and concerns addressed with instructions to call with any issues.  2 Attempt (S) . Patient tolerated procedure well.

## 2022-06-16 NOTE — MAU Note (Signed)
Julie Wilkinson is a 26 y.o. at [redacted]w[redacted]d here in MAU reporting: LOF since about 1645, clear fluid. Ongoing contractions, states a little bit more painful. No bleeding.  Onset of complaint: today  Pain score: 5/10  Vitals:   06/16/22 1737  BP: 116/63  Pulse: 86  Resp: 16  Temp: 98.7 F (37.1 C)  SpO2: 100%     FHT:+EFM applied   Lab orders placed from triage: [place]

## 2022-06-16 NOTE — Discharge Summary (Addendum)
Postpartum Discharge Summary     Patient Name: Julie Wilkinson DOB: 08/13/1995 MRN: [id number]  Date of admission: 06/16/2022 Delivery date:06/16/2022  Delivering provider: Wells Guiles R  Date of discharge: 06/18/2022  Admitting diagnosis: SROM/early labor Intrauterine pregnancy: [redacted]w[redacted]d    Secondary diagnosis:  none Additional problems: none    Discharge diagnosis: Term Pregnancy Delivered                                              Post partum procedures: none Augmentation:  none Complications: None  Hospital course: Onset of Labor With Vaginal Delivery      26y.o. yo [id number] admitted in Latent Labor on 06/16/2022. Labor course was complicated by Cat 2 strip  Membrane Rupture Time/Date: 4:45 PM ,06/16/2022   Delivery Method:Vaginal, Spontaneous  Episiotomy: None  Lacerations:  2nd degree;Periurethral  Patient had a postpartum course that was complicated by postpartum anemia.  She is ambulating, tolerating a regular diet, passing flatus, and urinating well. Patient is discharged home in stable condition on 06/18/22.  Newborn Data: Birth date:06/16/2022  Birth time:9:02 PM  Gender:Female  Living status:Living  Apgars:9 ,9  Weight:3470 g   Magnesium Sulfate received: No BMZ received: No Rhophylac:N/A MMR:N/A T-DaP:Given prenatally Flu: N/A Transfusion:No  Physical exam  Vitals:   06/17/22 1013 06/17/22 1405 06/17/22 2059 06/18/22 0516  BP: 99/61 98/67 (!) 99/58 (!) 103/59  Pulse: 72 65 69 84  Resp: _0 Temp: 98.3 F (36.8 C) 98.2 F (36.8 C) 97.8 F (36.6 C) 97.6 F (36.4 C)  TempSrc: Oral Oral Oral Oral  SpO2: 99% 100% 98% 99%   General: alert, cooperative, and no distress Lochia: appropriate Uterine Fundus: firm Incision: healing well DVT Evaluation: No evidence of DVT seen on physical exam. Negative Homan's sign. No cords or calf tenderness. No significant calf/ankle edema.  Labs: Lab Results  Component Value Date   WBC 13.3  (H) 06/17/2022   HGB 9.1 (L) 06/17/2022   HCT 27.4 (L) 06/17/2022   MCV 87.5 06/17/2022   PLT 169 06/17/2022      Latest Ref Rng & Units 04/23/2022   10:25 AM  CMP  Glucose 65 - 99 mg/dL 72   BUN 7 - 25 mg/dL 6   Creatinine 0.50 - 0.96 mg/dL 0.52   Sodium 135 - 146 mmol/L 135   Potassium 3.5 - 5.3 mmol/L 4.2   Chloride 98 - 110 mmol/L 104   CO2 20 - 32 mmol/L 23   Calcium 8.6 - 10.2 mg/dL 8.8   Total Protein 6.1 - 8.1 g/dL 6.1   Total Bilirubin 0.2 - 1.2 mg/dL 0.9   AST 10 - 30 U/L 15   ALT 6 - 29 U/L 7    Edinburgh Score:    06/16/2022   11:35 PM  Edinburgh Postnatal Depression Scale Screening Tool  I have been able to laugh and see the funny side of things. 0  I have looked forward with enjoyment to things. 0  I have blamed myself unnecessarily when things went wrong. 0  I have been anxious or worried for no good reason. 2  I have felt scared or panicky for no good reason. 0  Things have been getting on top of me. 0  I have been so unhappy that I have had difficulty sleeping. 0  I have felt sad or miserable. 0  I have been so unhappy that I have been crying. 0  The thought of harming myself has occurred to me. 0  Edinburgh Postnatal Depression Scale Total 2    After visit meds:  Allergies as of 06/18/2022       Reactions   Loracarbef Hives        Medication List     STOP taking these medications    promethazine 25 MG tablet Commonly known as: PHENERGAN   ursodiol 300 MG capsule Commonly known as: Actigall       TAKE these medications    acetaminophen 325 MG tablet Commonly known as: Tylenol Take 2 tablets (650 mg total) by mouth every 4 (four) hours as needed (for pain scale < 4). What changed:  when to take this reasons to take this   ferrous sulfate 325 (65 FE) MG tablet Take 1 tablet (325 mg total) by mouth every other day. Start taking on: June 19, 2022   ibuprofen 600 MG tablet Commonly known as: ADVIL Take 1 tablet (600 mg total)  by mouth every 6 (six) hours.         Discharge home in stable condition Infant Feeding: Bottle and Breast Infant Disposition:home with mother Discharge instruction: per After Visit Summary and Postpartum booklet. Activity: Advance as tolerated. Pelvic rest for 6 weeks.  Diet: routine diet Future Appointments: Future Appointments  Date Time Provider [place]  06/23/2022 10:30 AM Rasch, Artist Pais, NP [facility]   Follow up Visit: Roma Schanz, CNM  P [facility] Support Pool Please schedule this patient for PP visit in: 4-6wks Low risk pregnancy complicated by: none Delivery mode:  SVD Anticipated Birth Control:  other/unsure PP Procedures needed: [place] visit: yes Provider: Any provider  06/18/2022 Rebecca Eaton, MD  CNM attestation I have seen and examined this patient and agree with above documentation in the resident's note.   Dale Strausser is a 26 y.o. C6C3762 s/p vag del. Pain is well controlled.  Plan for birth control is  unsure .  Method of Feeding: pumping/bottle  PE:  BP (!) 103/59 (BP Location: Right Arm)   Pulse 84   Temp 97.6 F (36.4 C) (Oral)   Resp 18   LMP 09/25/2021   SpO2 99%   Breastfeeding Unknown  Fundus firm  Recent Labs    06/16/22 1801 06/17/22 0510  HGB 10.8* 9.1*  HCT 33.1* 27.4*     Plan: discharge today - postpartum care discussed - f/u clinic in 4-6 weeks for postpartum visit   Myrtis Ser, CNM 5:37 PM 06/18/2022

## 2022-06-17 LAB — CBC
HCT: 27.4 % — ABNORMAL LOW (ref 36.0–46.0)
Hemoglobin: 9.1 g/dL — ABNORMAL LOW (ref 12.0–15.0)
MCH: 29.1 pg (ref 26.0–34.0)
MCHC: 33.2 g/dL (ref 30.0–36.0)
MCV: 87.5 fL (ref 80.0–100.0)
Platelets: 169 10*3/uL (ref 150–400)
RBC: 3.13 MIL/uL — ABNORMAL LOW (ref 3.87–5.11)
RDW: 12.8 % (ref 11.5–15.5)
WBC: 13.3 10*3/uL — ABNORMAL HIGH (ref 4.0–10.5)
nRBC: 0 % (ref 0.0–0.2)

## 2022-06-17 LAB — RPR: RPR Ser Ql: NONREACTIVE

## 2022-06-17 MED ORDER — DIPHENHYDRAMINE HCL 25 MG PO CAPS: 25.0000 mg | ORAL_CAPSULE | Freq: Four times a day (QID) | ORAL | Status: DC | PRN

## 2022-06-17 MED ORDER — TETANUS-DIPHTH-ACELL PERTUSSIS 5-2.5-18.5 LF-MCG/0.5 IM SUSY: 0.5000 mL | PREFILLED_SYRINGE | Freq: Once | INTRAMUSCULAR | Status: DC

## 2022-06-17 MED ORDER — ONDANSETRON HCL 4 MG PO TABS: 4.0000 mg | ORAL_TABLET | ORAL | Status: DC | PRN

## 2022-06-17 MED ORDER — SODIUM CHLORIDE 0.9% FLUSH
3.0000 mL | Freq: Two times a day (BID) | INTRAVENOUS | Status: DC
  Administered 2022-06-17: 3 mL via INTRAVENOUS

## 2022-06-17 MED ORDER — BISACODYL 10 MG RE SUPP: 10.0000 mg | Freq: Every day | RECTAL | Status: DC | PRN

## 2022-06-17 MED ORDER — MEASLES, MUMPS & RUBELLA VAC IJ SOLR: 0.5000 mL | Freq: Once | INTRAMUSCULAR | Status: DC

## 2022-06-17 MED ORDER — SIMETHICONE 80 MG PO CHEW: 80.0000 mg | CHEWABLE_TABLET | ORAL | Status: DC | PRN

## 2022-06-17 MED ORDER — COCONUT OIL OIL: 1.0000 | TOPICAL_OIL | Status: DC | PRN

## 2022-06-17 MED ORDER — SODIUM CHLORIDE 0.9 % IV SOLN: 250.0000 mL | INTRAVENOUS | Status: DC | PRN

## 2022-06-17 MED ORDER — DIBUCAINE (PERIANAL) 1 % EX OINT: 1.0000 | TOPICAL_OINTMENT | CUTANEOUS | Status: DC | PRN

## 2022-06-17 MED ORDER — BENZOCAINE-MENTHOL 20-0.5 % EX AERO: 1.0000 | INHALATION_SPRAY | CUTANEOUS | Status: DC | PRN

## 2022-06-17 MED ORDER — SODIUM CHLORIDE 0.9% FLUSH: 3.0000 mL | INTRAVENOUS | Status: DC | PRN

## 2022-06-17 MED ORDER — SENNOSIDES-DOCUSATE SODIUM 8.6-50 MG PO TABS
2.0000 | ORAL_TABLET | Freq: Every day | ORAL | Status: DC
  Administered 2022-06-17 – 2022-06-18 (×2): 2 via ORAL
  Filled 2022-06-17 (×2): qty 2

## 2022-06-17 MED ORDER — ZOLPIDEM TARTRATE 5 MG PO TABS: 5.0000 mg | ORAL_TABLET | Freq: Every evening | ORAL | Status: DC | PRN

## 2022-06-17 MED ORDER — ACETAMINOPHEN 325 MG PO TABS
650.0000 mg | ORAL_TABLET | ORAL | Status: DC | PRN
  Administered 2022-06-17 (×2): 650 mg via ORAL
  Filled 2022-06-17 (×2): qty 2

## 2022-06-17 MED ORDER — WITCH HAZEL-GLYCERIN EX PADS: 1.0000 | MEDICATED_PAD | CUTANEOUS | Status: DC | PRN

## 2022-06-17 MED ORDER — FLEET ENEMA 7-19 GM/118ML RE ENEM: 1.0000 | ENEMA | Freq: Every day | RECTAL | Status: DC | PRN

## 2022-06-17 MED ORDER — ONDANSETRON HCL 4 MG/2ML IJ SOLN: 4.0000 mg | INTRAMUSCULAR | Status: DC | PRN

## 2022-06-17 MED ORDER — PRENATAL MULTIVITAMIN CH
1.0000 | ORAL_TABLET | Freq: Every day | ORAL | Status: DC
  Administered 2022-06-17 – 2022-06-18 (×2): 1 via ORAL
  Filled 2022-06-17 (×2): qty 1

## 2022-06-17 MED ORDER — FERROUS SULFATE 325 (65 FE) MG PO TABS
325.0000 mg | ORAL_TABLET | ORAL | Status: DC
  Administered 2022-06-17: 325 mg via ORAL
  Filled 2022-06-17: qty 1

## 2022-06-17 MED ORDER — IBUPROFEN 600 MG PO TABS
600.0000 mg | ORAL_TABLET | Freq: Four times a day (QID) | ORAL | Status: DC
  Administered 2022-06-17 – 2022-06-18 (×7): 600 mg via ORAL
  Filled 2022-06-17 (×7): qty 1

## 2022-06-17 NOTE — Anesthesia Postprocedure Evaluation (Signed)
Anesthesia Post Note  Patient: Julie Wilkinson  Procedure(s) Performed: AN AD HOC LABOR EPIDURAL     Patient location during evaluation: Mother Baby Anesthesia Type: Epidural Level of consciousness: awake, oriented and awake and alert Pain management: pain level controlled Vital Signs Assessment: post-procedure vital signs reviewed and stable Respiratory status: respiratory function stable, nonlabored ventilation and spontaneous breathing Cardiovascular status: stable Postop Assessment: no headache, adequate PO intake, able to ambulate, patient able to bend at knees and no apparent nausea or vomiting Anesthetic complications: no   No notable events documented.  Last Vitals:  Vitals:   06/17/22 0116 06/17/22 0553  BP: (!) 111/42 (!) 103/58  Pulse: 77 68  Resp: 20 20  Temp: 36.6 C 36.6 C  SpO2: 98% 100%    Last Pain:  Vitals:   06/17/22 0553  TempSrc: Oral  PainSc: 4    Pain Goal:                   Jarry Manon

## 2022-06-17 NOTE — Lactation Note (Signed)
This note was copied from a baby's chart. Lactation Consultation Note  Patient Name: Julie Wilkinson [id number] Date: 06/17/2022   Age:28 hours RN(Gaye) will ask Birth Parent if she would like to be seen by [facility] services on MBU. RN will call LC on Vocera.  Maternal Data    Feeding    LATCH Score                    Lactation Tools Discussed/Used    Interventions    Discharge    Consult Status      [name] 06/17/2022, 12:40 AM

## 2022-06-17 NOTE — Clinical Social Work Maternal (Signed)
CLINICAL SOCIAL WORK MATERNAL/CHILD NOTE  Patient Details  Name: Julie Wilkinson MRN: [id number] Date of Birth: 03-12-1996  Date:  06/17/2022  Clinical Social Worker Initiating Note:  Letta Kocher, LCSWA Date/Time: Initiated:  06/17/22/1429     Child's Name:  Julie Wilkinson   Biological Parents:  Mother Julie Wilkinson 12-Jan-1996)   Need for Interpreter:  None   Reason for Referral:      Address:  [address] [place] [address]    Phone number:  [phone] (home)     Additional phone number:   Household Members/Support Persons (HM/SP):       HM/SP Name Relationship DOB or Age  HM/SP -1  Julie Wilkinson  Boyfriend    HM/SP -2  Julie Wilkinson  daughter  2  HM/SP -3        HM/SP -4        HM/SP -5        HM/SP -6        HM/SP -7        HM/SP -8          Natural Supports (not living in the home):  Parent   Professional Supports:     Employment: Unemployed   Type of Work:     Education:  [place] arranged:    Museum/gallery curator Resources:  [employer]   Other Resources:  Arts development officer Considerations Which May Impact Care:    Strengths:  Ability to meet basic needs  , Home prepared for child  , Pediatrician chosen   Psychotropic Medications:         Pediatrician:    [place] (including [place])  Pediatrician List:   [facility]    Pediatrician Fax Number:    Risk Factors/Current Problems:  Mental Health Concerns     Cognitive State:  Able to Concentrate     Mood/Affect:  Flat  , Calm     CSW Assessment: CSW received consult for Anxiety, Depression, PTSD and Bipolar. CSW met with MOB to complete an assessment and offer support. When CSW entered the room, MOB was resting in bed and the infant was in the bassinet resting comfortably. CSW introduced self, CSW role and reason for  visit, MOB was agreeable to visit. CSW inquired about how MOB was feeling, MOB reported good. CSW confirmed MOB demographic info.   CSW inquired about MOB MH hx, MOB reported she was diagnosed with Anx and dep when she was about 13 and Bipolar in her early 9's. CSW inquired about treatment, MOB reported none. MOB reported she no longer sees [place], [place]. CSW inquired about how MOB copes with her symptoms, MOB stated "I've dealt with is for so long I just know how to cope" MOB was not forthcoming on specific coping skills. CSW assessed for safety, MOB denied any SI, HI or DV. Mob identified her boyfriend, mom and dad as her supports. CSW provided education regarding the baby blues period vs. perinatal mood disorders, discussed treatment and gave resources for mental health follow up if concerns arise.  CSW recommends self-evaluation during the postpartum time period using the New Mom Checklist from Postpartum Progress and encouraged MOB to contact a medical professional if symptoms are noted at any time.    CSW provided review of Sudden Infant Death  Syndrome (SIDS) precautions. Mob reported she has all necessary items for the infant including a bassinet for him to sleep. CSW identifies no further need for intervention and no barriers to discharge at this time.  CSW Plan/Description:  No Further Intervention Required/No Barriers to Discharge, Sudden Infant Death Syndrome (SIDS) Education, Perinatal Mood and Anxiety Disorder (PMADs) Education    Boris Sharper, LCSW 06/17/2022, 2:33 PM

## 2022-06-17 NOTE — Lactation Note (Signed)
This note was copied from a baby's chart. Lactation Consultation Note  Patient Name: Julie Wilkinson [id number]'Y Date: 06/17/2022 Reason for consult: Follow-up assessment (LC noted a sign on the door resting.) Age:28 hours   Feeding Mother's Current Feeding Choice: Breast Milk  Consult Status Consult Status: Follow-up Date: 06/17/22 Follow-up type: In-patient    Matilde Sprang Markie Frith 06/17/2022, 3:21 PM

## 2022-06-17 NOTE — Progress Notes (Addendum)
POSTPARTUM PROGRESS NOTE  Post Partum Day 1  Subjective:  Julie Wilkinson is a 26 y.o. T0P5465 s/p SVD at [redacted]w[redacted]d.  She reports she is doing well. No acute events overnight. She denies any problems with ambulating, voiding or po intake. Denies nausea or vomiting.  No HA, vision changes, CP, SOB, RUQ pain, edema, calf pain or dizziness or weakness. Pain is well controlled.  Lochia is the level of a menstrual period.  Objective: Blood pressure (!) 103/58, pulse 68, temperature 97.9 F (36.6 C), temperature source Oral, resp. rate 20, last menstrual period 09/25/2021, SpO2 100 %, unknown if currently breastfeeding.  Physical Exam:  General: alert, cooperative and no distress Chest: no respiratory distress Heart:regular rate, distal pulses intact Abdomen: soft, nontender,  Uterine Fundus: firm, appropriately tender DVT Evaluation: No calf swelling or tenderness Extremities: trace edema Skin: warm, dry  Recent Labs    06/16/22 1801 06/17/22 0510  HGB 10.8* 9.1*  HCT 33.1* 27.4*    Assessment/Plan: Julie Wilkinson is a 26 y.o. [id number] s/p SVD at [redacted]w[redacted]d   PPD#1 - Doing well  Routine postpartum care Anemia: start PO iron every other day Contraception: undecided, would like to discuss at 6w PPV.  Feeding: breast Dispo: Plan for discharge PPD2.   LOS: 1 day   Gregary Cromer, MD  PGY-3, [facility] Visiting Resident 06/17/2022, 7:40 AM

## 2022-06-17 NOTE — Lactation Note (Signed)
This note was copied from a baby's chart. Lactation Consultation Note  Patient Name: Julie Wilkinson [id number] Date: 06/17/2022 Reason for consult: Initial assessment;Early term 37-38.6wks Age:28 hours Birth Parent is experienced with BF see maternal data below.  Birth Parent latched infant on her left breast using the cross cradle hold position, infant was on and off breast for 10 minutes and still BF when LC left the room. Birth Parent knows to continue to BF infant on demand, 8 to 12+ times within 24 hrs, STS. Birth Parent knows to ask  RN/LC for further latch assistance if needed.  Mom made aware of O/P services, breastfeeding support groups, community resources, and our phone # for post-discharge questions.   Maternal Data Does the patient have breastfeeding experience prior to this delivery?: Yes How long did the patient breastfeed?: Per Birth Parent, she BF 1st child for 2 years who is almost 53 years old.  Feeding Mother's Current Feeding Choice: Breast Milk  LATCH Score Latch: Grasps breast easily, tongue down, lips flanged, rhythmical sucking.  Audible Swallowing: A few with stimulation  Type of Nipple: Everted at rest and after stimulation  Comfort (Breast/Nipple): Soft / non-tender  Hold (Positioning): Assistance needed to correctly position infant at breast and maintain latch.  LATCH Score: 8   Lactation Tools Discussed/Used    Interventions Interventions: Breast feeding basics reviewed;Assisted with latch;Skin to skin;Support pillows;Adjust position;Position options;Expressed milk;Breast massage  Discharge Pump: DEBP  Consult Status Consult Status: Follow-up Date: 06/17/22 Follow-up type: In-patient    [name] 06/17/2022, 3:26 AM

## 2022-06-18 MED ORDER — IBUPROFEN 600 MG PO TABS: 600.0000 mg | ORAL_TABLET | Freq: Four times a day (QID) | ORAL | 0 refills | Status: DC

## 2022-06-18 MED ORDER — FERROUS SULFATE 325 (65 FE) MG PO TABS: 325.0000 mg | ORAL_TABLET | ORAL | 0 refills | Status: DC

## 2022-06-23 ENCOUNTER — Encounter: Payer: Medicaid Other | Admitting: Obstetrics and Gynecology

## 2022-06-26 ENCOUNTER — Telehealth (HOSPITAL_COMMUNITY): Payer: Self-pay | Admitting: *Deleted

## 2022-06-26 NOTE — Telephone Encounter (Signed)
Voicemail not setup. Unable to leave message.  Duffy Rhody, RN 06-26-2022 at 11:10am

## 2022-06-27 ENCOUNTER — Inpatient Hospital Stay (HOSPITAL_COMMUNITY): Payer: Medicaid Other

## 2022-07-11 IMAGING — US US OB COMP LESS 14 WK
1 series · 15 of 28 positions shown · non-contrast
Comparison: July 19, 2019

CLINICAL DATA: Vaginal bleeding.

EXAM:
OBSTETRIC <14 WK ULTRASOUND
TECHNIQUE: Transabdominal ultrasound was performed for evaluation of the
gestation as well as the maternal uterus and adnexal regions.

[Series 1: us ob comp less 14 wk · 15 of 34 slices shown]
[im 1/34]
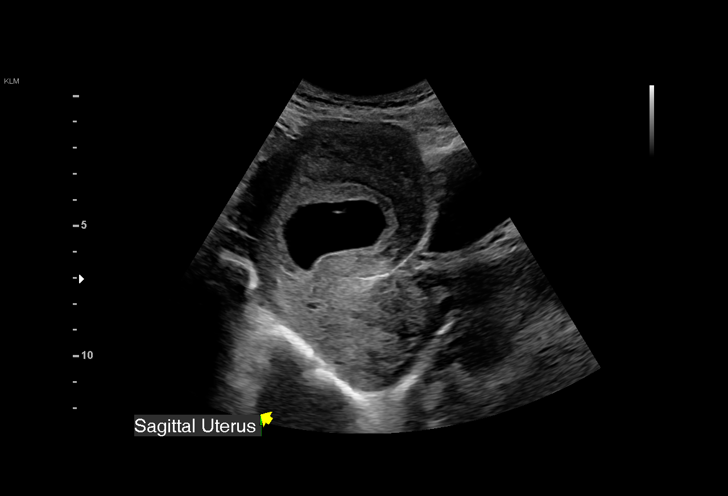
[im 3/34]
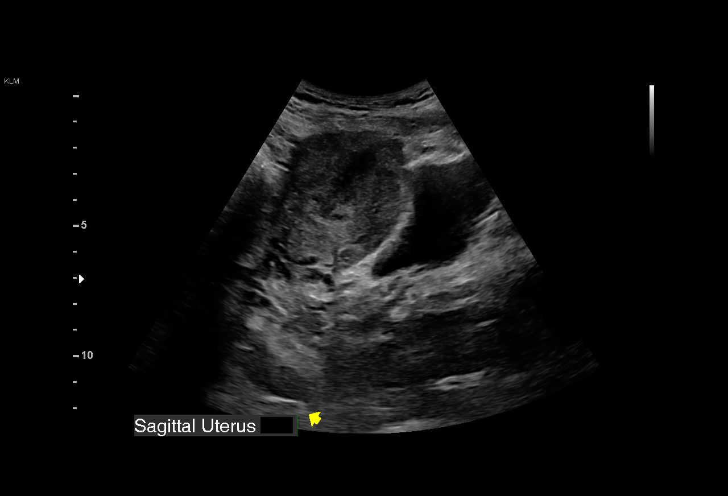
[im 5/34]
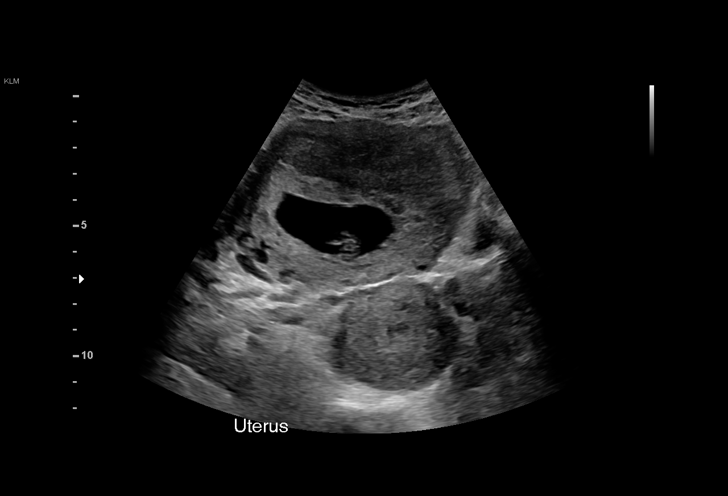
[im 8/34]
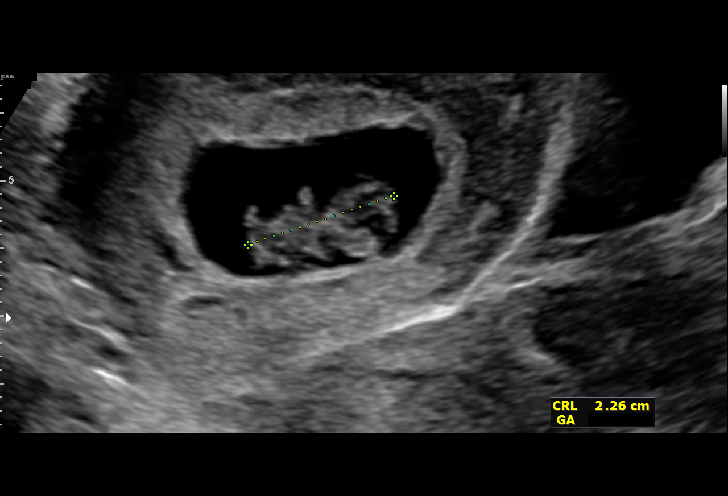
[im 10/34]
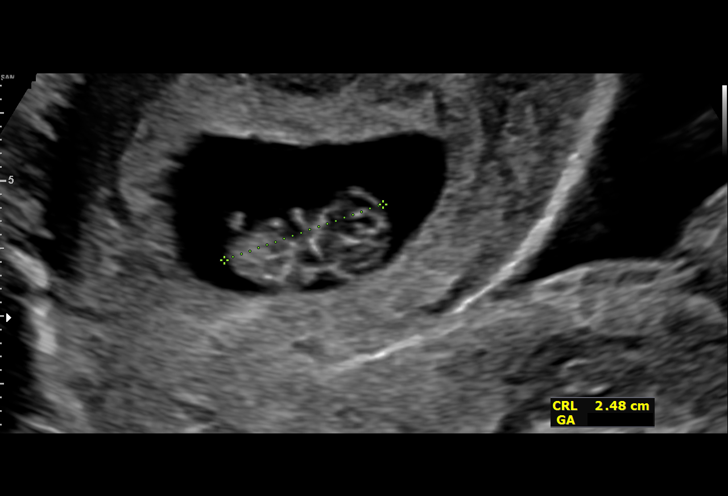
[im 13/34]
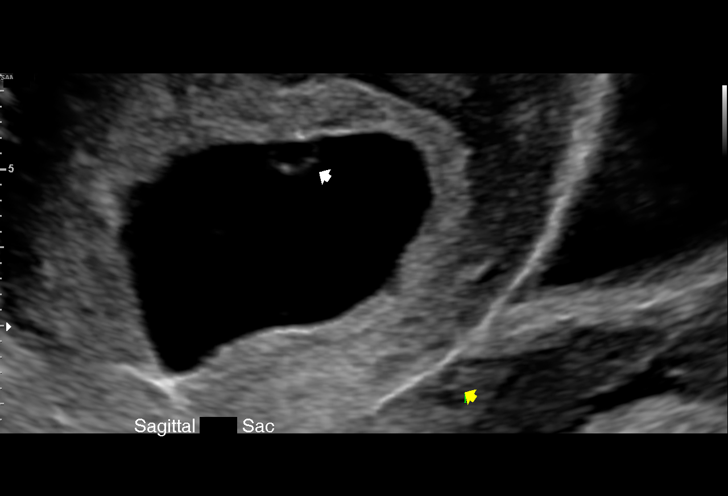
[im 15/34]
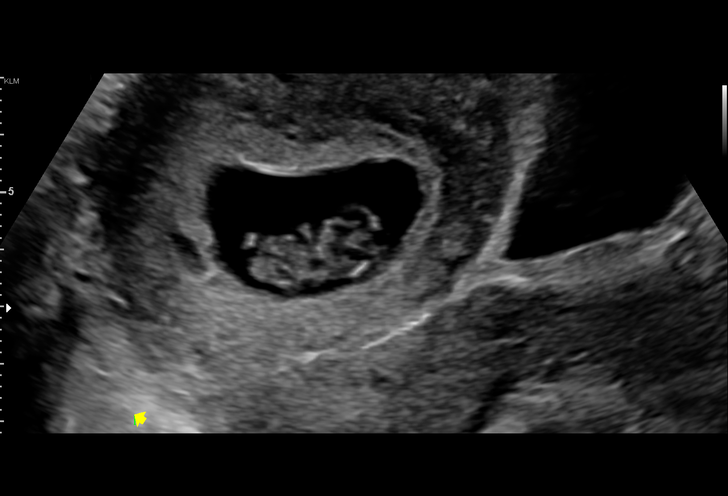
[im 18/34]
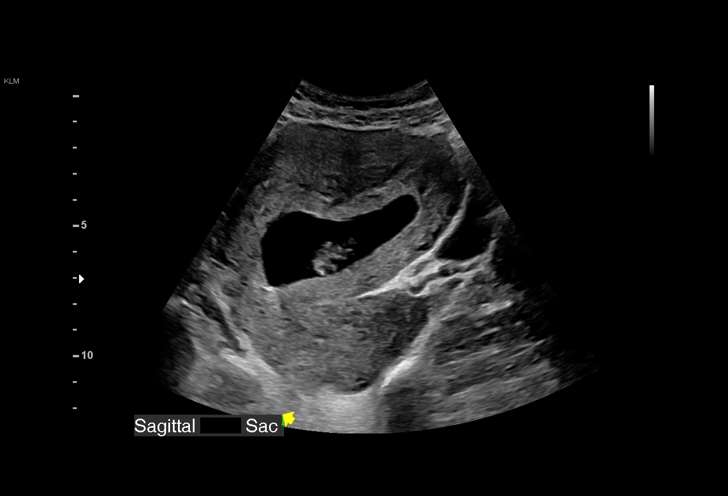
[im 19/34]
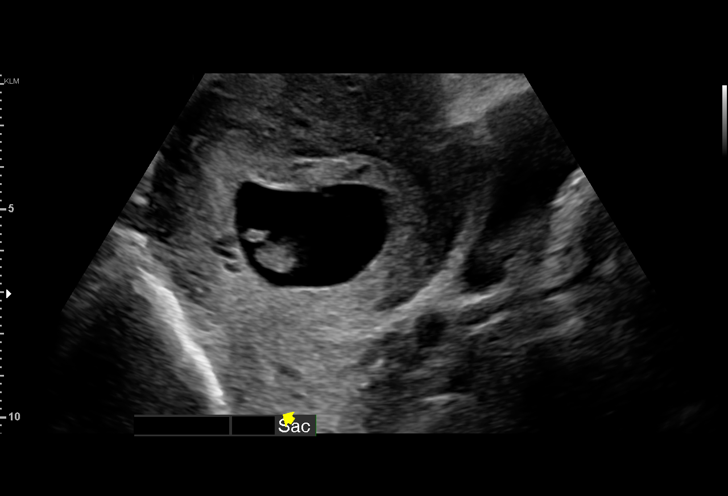
[im 21/34]
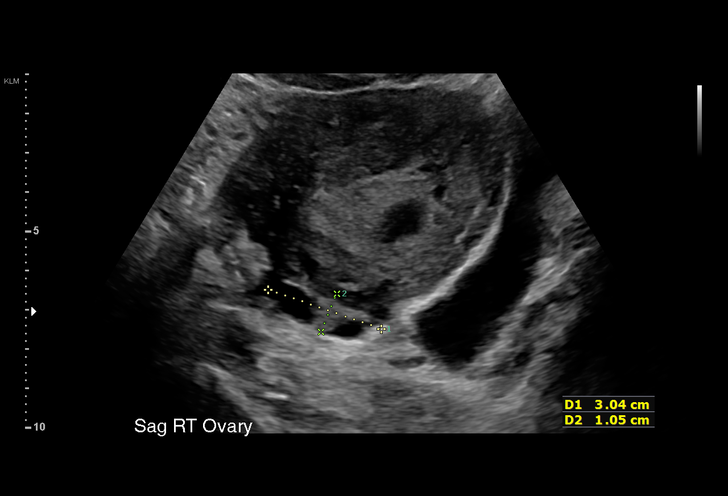
[im 24/34]
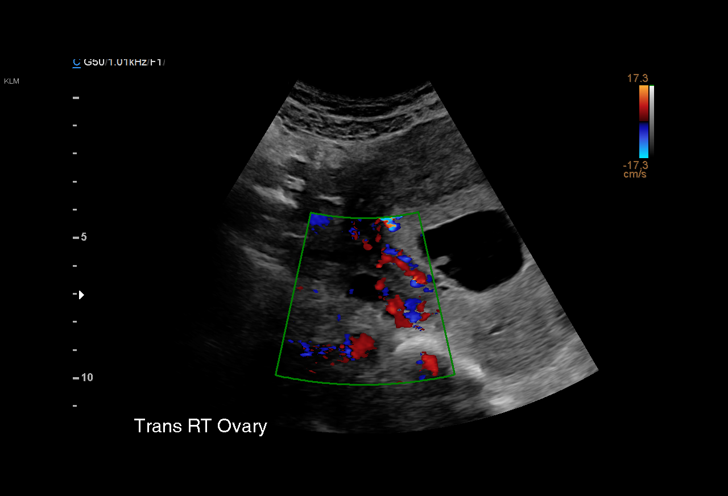
[im 26/34]
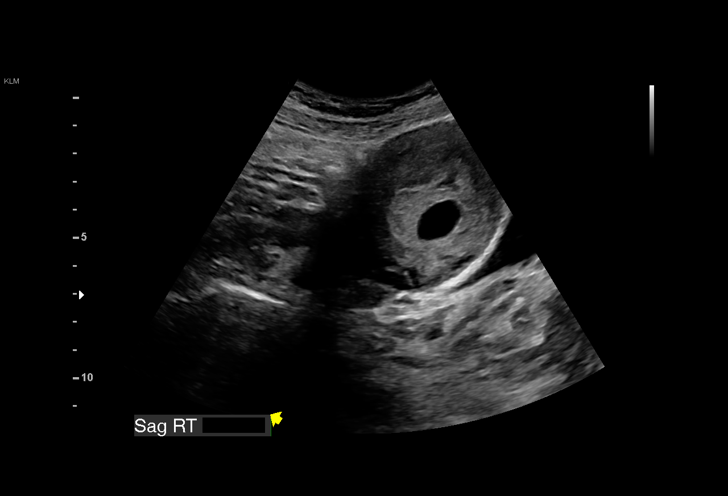
[im 29/34]
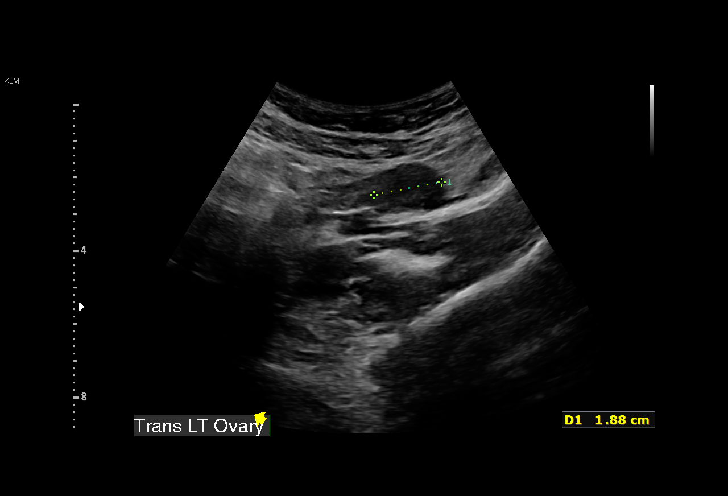
[im 31/34]
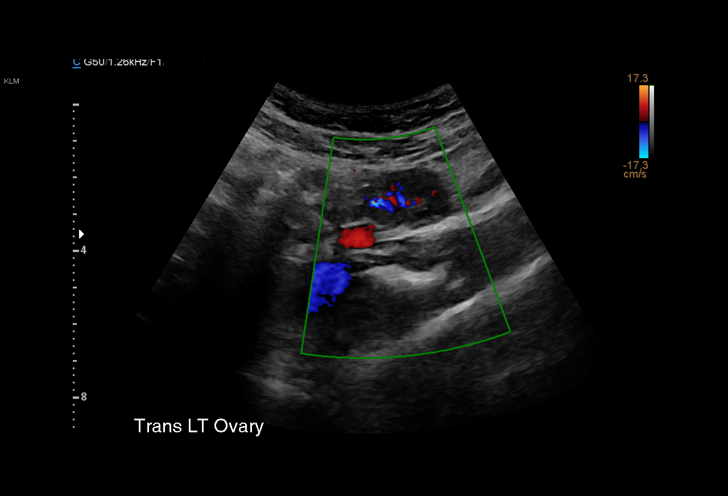
[im 34/34]
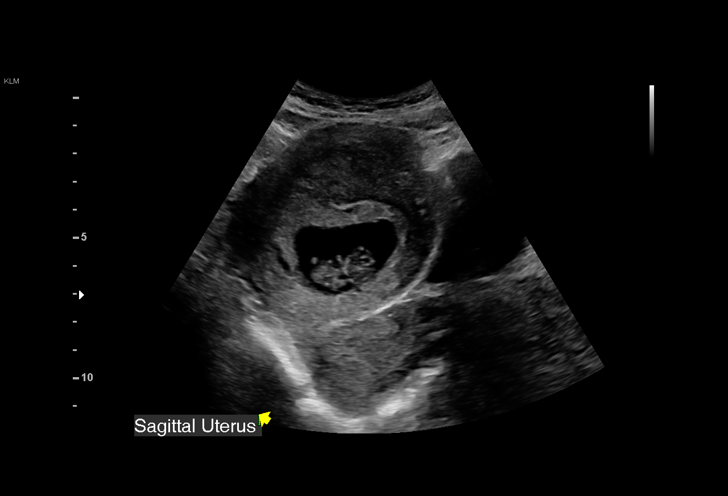

[15 of 28 positions shown; findings below may reference images not displayed]

FINDINGS: Intrauterine gestational sac: Single

Yolk sac:  Visualized.

Embryo:  Visualized.

Cardiac Activity: Visualized.

Heart Rate: 176 bpm

CRL:   24.4 mm   9 w 1 d                  US EDC: March 23, 2021

Subchorionic hemorrhage:  None visualized.

Maternal uterus/adnexae: The bilateral ovaries are visualized and
are normal in appearance.

No pelvic free fluid is seen.
IMPRESSION: Single, viable intrauterine pregnancy at approximately 9 weeks and 1
day gestation by ultrasound evaluation.

## 2022-07-22 NOTE — Progress Notes (Deleted)
    Post Partum Visit Note  Julie Wilkinson is a 26 y.o. [phone] female who presents for a postpartum visit. She is 6 weeks postpartum following a normal spontaneous vaginal delivery.  I have fully reviewed the prenatal and intrapartum course. The delivery was at 37.5 gestational weeks.  Anesthesia: epidural. Postpartum course has been ***. Baby is doing well***. Baby is feeding by {breastmilk/bottle:69}. Bleeding {vag bleed:12292}. Bowel function is {normal:32111}. Bladder function is {normal:32111}. Patient {is/is not:9024} sexually active. Contraception method is {contraceptive method:5051}. Postpartum depression screening: {gen negative/positive:315881}.   The pregnancy intention screening data noted above was reviewed. Potential methods of contraception were discussed. The patient elected to proceed with No data recorded.    Health Maintenance Due  Topic Date Due   COVID-19 Vaccine (1) Never done   INFLUENZA VACCINE  03/10/2022    {Common ambulatory SmartLinks:19316}  Review of Systems {ros; complete:30496}  Objective:  There were no vitals taken for this visit.   General:  {gen appearance:16600}   Breasts:  {desc; normal/abnormal/not indicated:14647}  Lungs: {lung exam:16931}  Heart:  {heart exam:5510}  Abdomen: {abdomen exam:16834}   Wound {Wound assessment:11097}  GU exam:  {desc; normal/abnormal/not indicated:14647}       Assessment:    There are no diagnoses linked to this encounter.  *** postpartum exam.   Plan:   Essential components of care per ACOG recommendations:  1.  Mood and well being: Patient with {gen negative/positive:315881} depression screening today. Reviewed local resources for support.  - Patient tobacco use? {tobacco use:25506}  - hx of drug use? {yes/no:25505}    2. Infant care and feeding:  -Patient currently breastmilk feeding? {yes/no:25502}  -Social determinants of health (SDOH) reviewed in EPIC. No concerns***The following needs were  identified***  3. Sexuality, contraception and birth spacing - Patient {DOES_DOES MWU:13244} want a pregnancy in the next year.  Desired family size is {NUMBER 1-10:22536} children.  - Reviewed reproductive life planning. Reviewed contraceptive methods based on pt preferences and effectiveness.  Patient desired {Upstream End Methods:24109} today.   - Discussed birth spacing of 18 months  4. Sleep and fatigue -Encouraged family/partner/community support of 4 hrs of uninterrupted sleep to help with mood and fatigue  5. Physical Recovery  - Discussed patients delivery and complications. She describes her labor as {description:25511} - Patient had a {CHL AMB [phone]}. Patient had a {laceration:25518} laceration. Perineal healing reviewed. Patient expressed understanding - Patient has urinary incontinence? {yes/no:25515} - Patient {ACTION; IS/IS WNU:27253664} safe to resume physical and sexual activity  6.  Health Maintenance - HM due items addressed {Yes or If no, why not?:20788} - Last pap smear  Diagnosis  Date Value Ref Range Status  12/02/2021 (A)  Final   - High grade squamous intraepithelial lesion (HSIL)   Pap smear {done:10129} at today's visit.  -Breast Cancer screening indicated? {indicated:25516}  7. Chronic Disease/Pregnancy Condition follow up: {Follow up:25499}  - PCP follow up  Kathie Dike, CMA [facility] [employer], [facility]

## 2022-07-30 ENCOUNTER — Ambulatory Visit: Payer: Medicaid Other | Admitting: Obstetrics & Gynecology

## 2022-07-30 ENCOUNTER — Telehealth: Payer: Self-pay | Admitting: *Deleted

## 2022-07-30 NOTE — Telephone Encounter (Signed)
Left patient a message to call and reschedule missed Postpartum appointment.

## 2023-09-06 ENCOUNTER — Ambulatory Visit: Payer: Medicaid Other

## 2023-09-06 DIAGNOSIS — Z3200 Encounter for pregnancy test, result unknown: Secondary | ICD-10-CM

## 2023-09-06 NOTE — Progress Notes (Signed)
Pt here for BHCG due to negative and positive home UPTs

## 2023-09-07 LAB — BETA HCG QUANT (REF LAB): hCG Quant: <1 m[IU]/mL

## 2023-09-08 ENCOUNTER — Encounter: Payer: Self-pay | Admitting: Obstetrics and Gynecology

## 2023-09-29 IMAGING — US US OB < 14 WEEKS - US OB TV
1 series · 15 of 28 positions shown · non-contrast
Comparison: None.

CLINICAL DATA: Vaginal bleeding.

EXAM:
OBSTETRIC <14 WK US AND TRANSVAGINAL OB US
TECHNIQUE: Both transabdominal and transvaginal ultrasound examinations were
performed for complete evaluation of the gestation as well as the
maternal uterus, adnexal regions, and pelvic cul-de-sac.
Transvaginal technique was performed to assess early pregnancy.

[Series 1: us ob < 14 weeks - us ob tv · 15 of 134 slices shown]
[im 1/134]
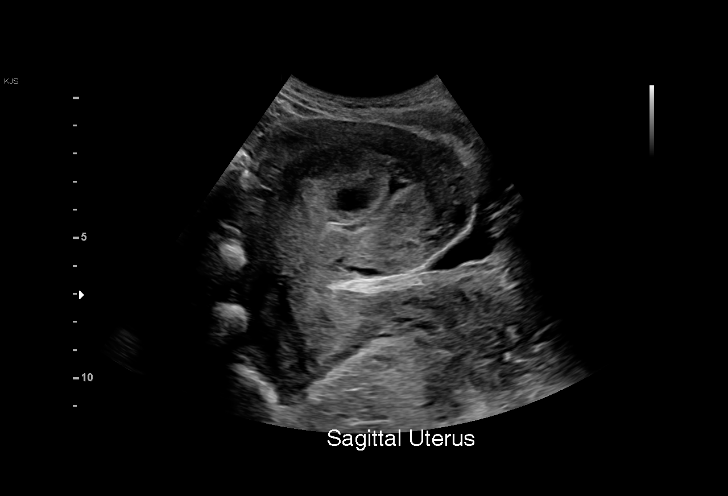
[im 10/134]
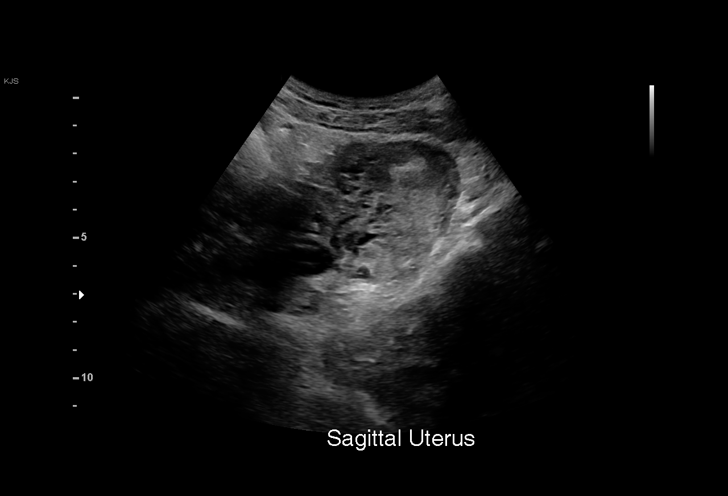
[im 20/134]
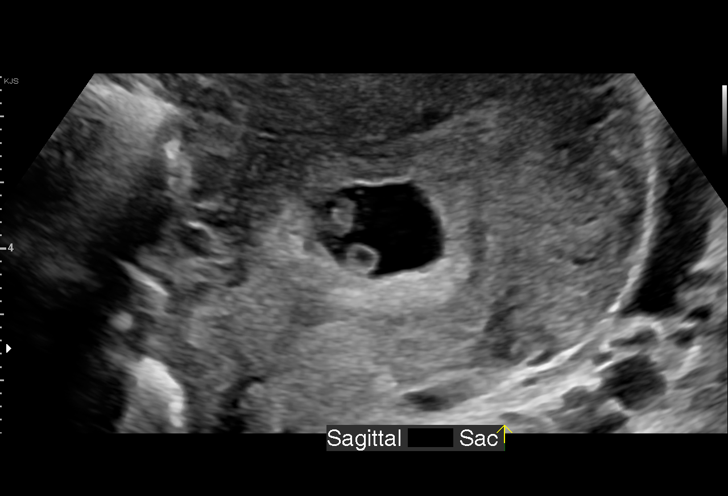
[im 30/134]
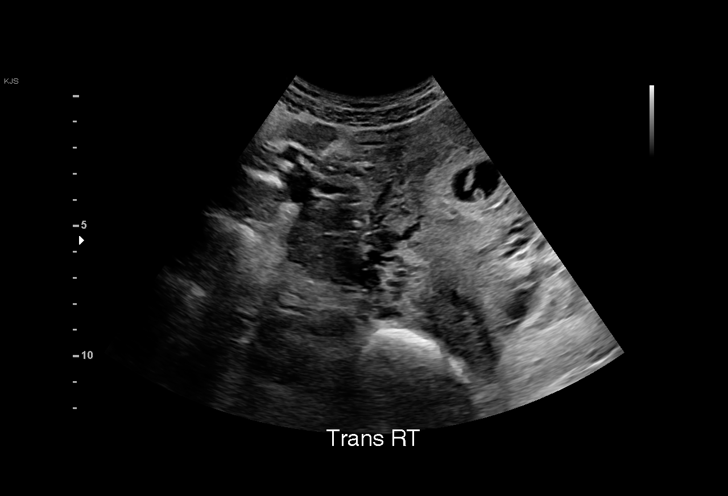
[im 40/134]
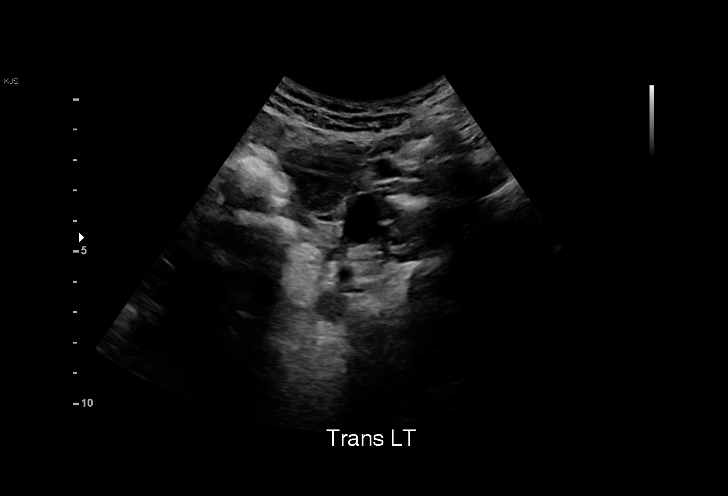
[im 50/134]
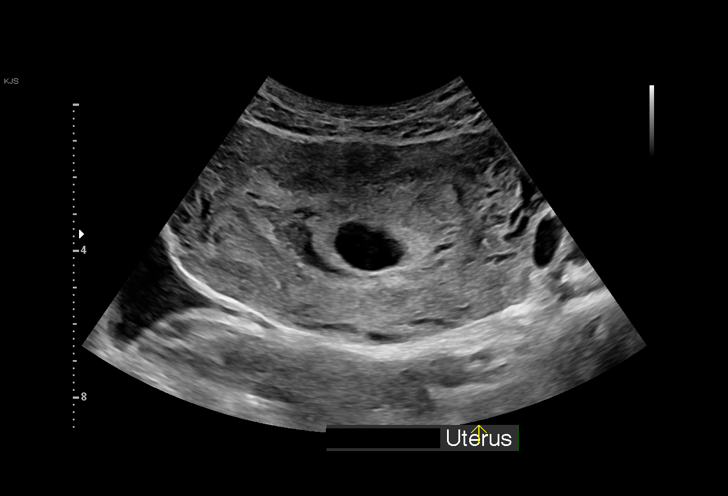
[im 60/134]
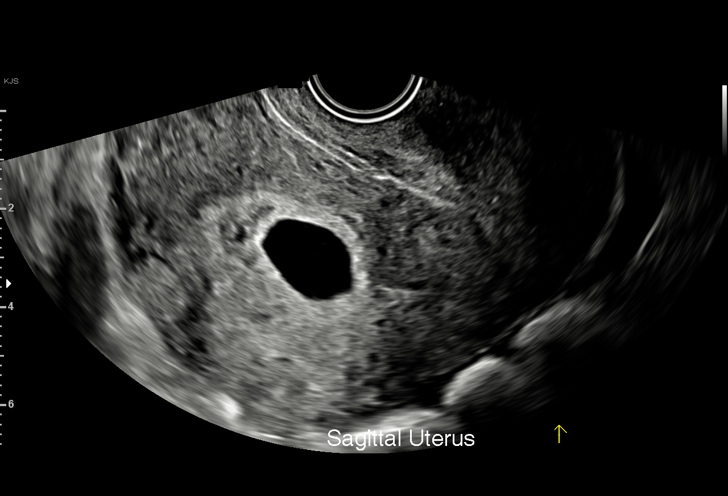
[im 69/134]
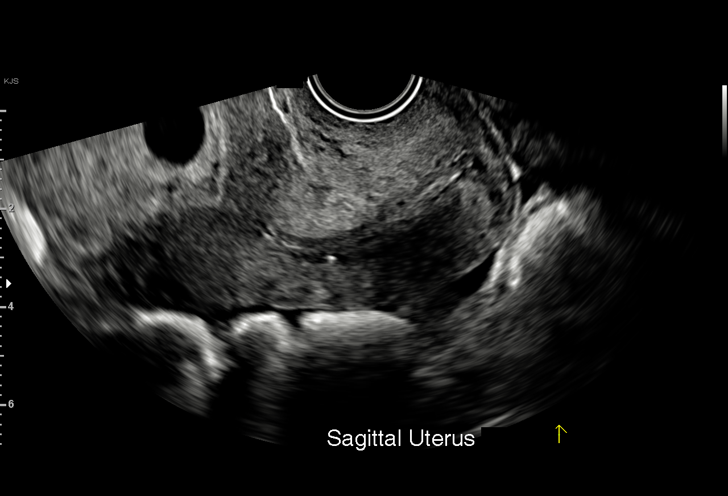
[im 74/134]
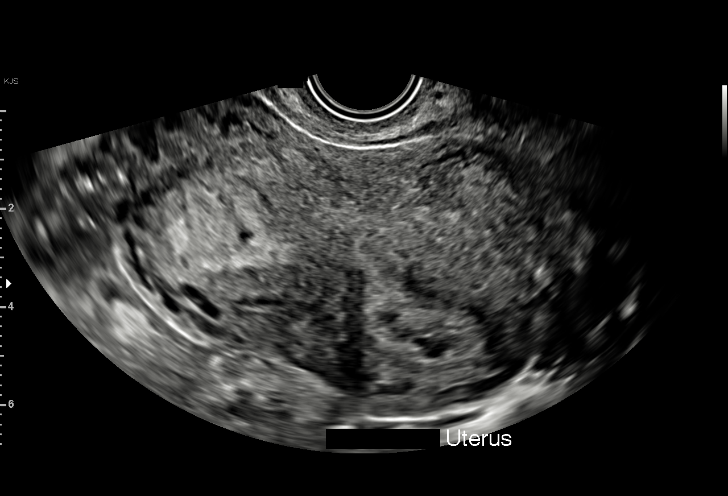
[im 84/134]
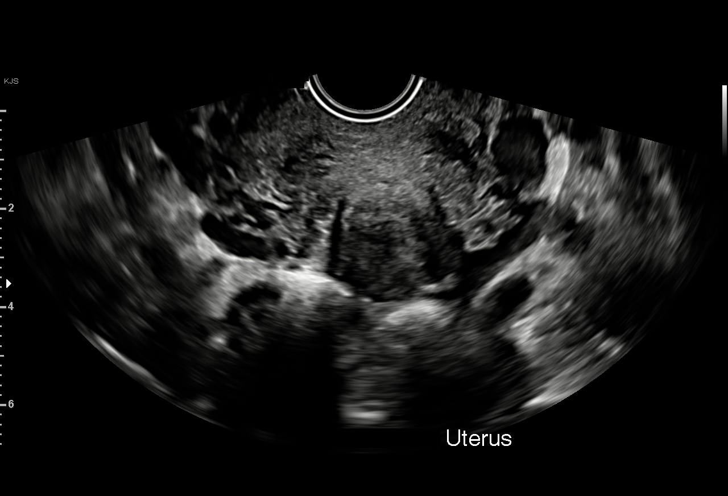
[im 94/134]
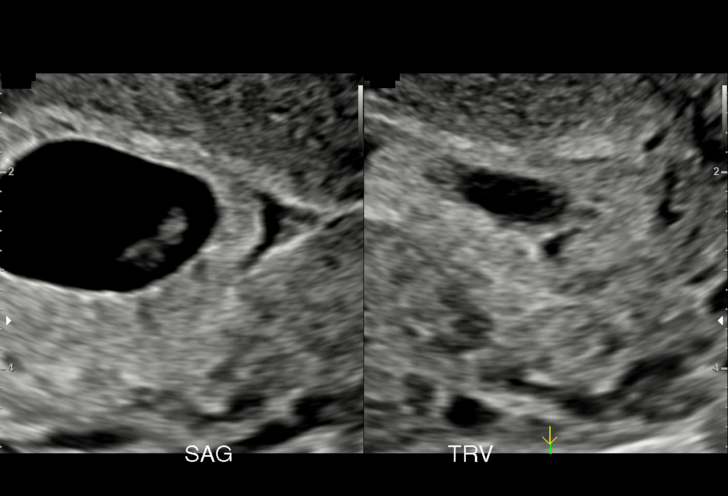
[im 104/134]
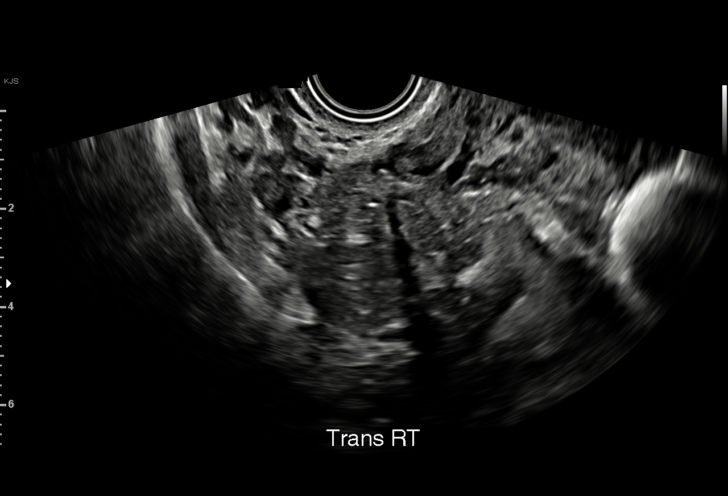
[im 114/134]
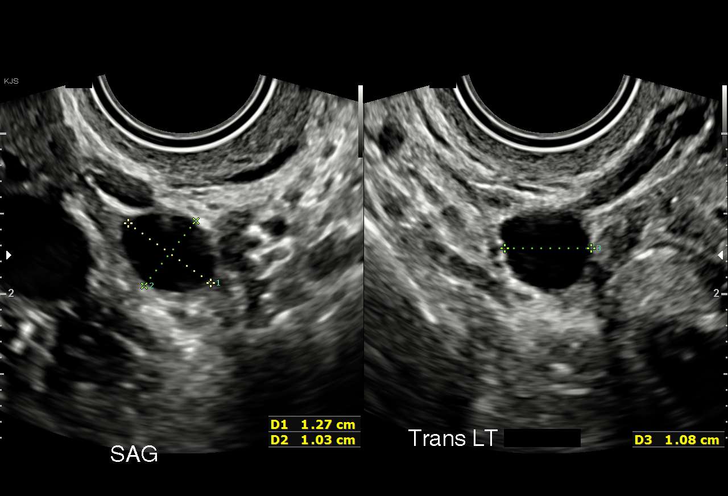
[im 124/134]
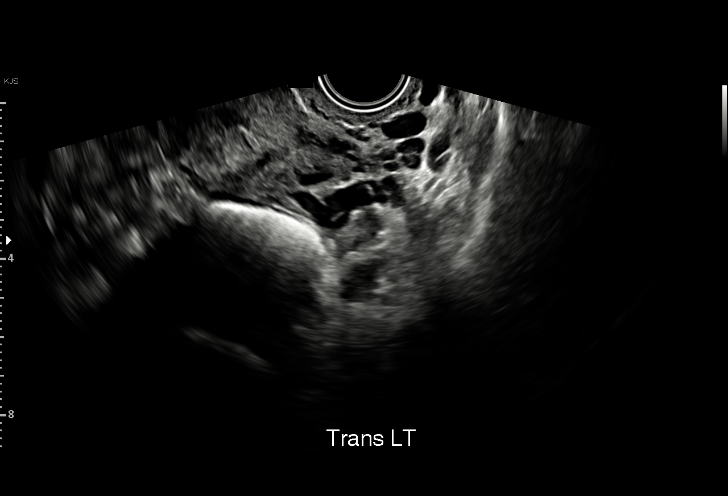
[im 134/134]
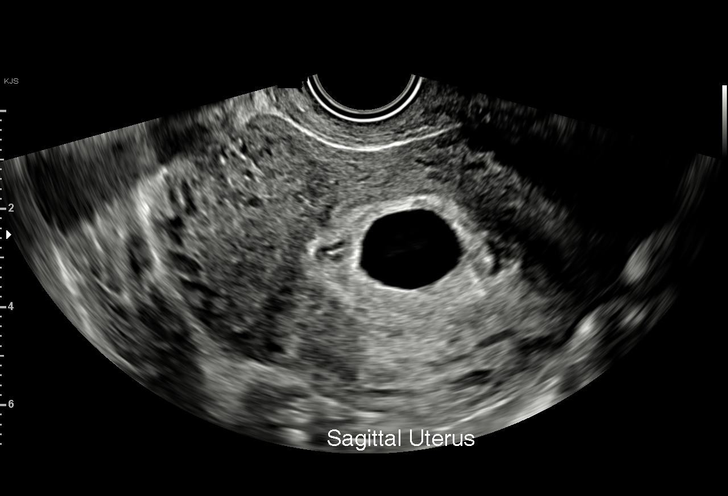

[15 of 28 positions shown; findings below may reference images not displayed]

FINDINGS: Intrauterine gestational sac: Single

Yolk sac:  Visualized.

Embryo:  Visualized.

Cardiac Activity: Visualized.

Heart Rate: 137 bpm

CRL:  9.6 mm   7 w   0 d                  US EDC: July 03, 2022.

Subchorionic hemorrhage: Possible small subchorionic hemorrhage may
be present.

Maternal uterus/adnexae: Corpus luteum cyst seen in left ovary.
Right ovary is unremarkable. No free fluid is noted.
IMPRESSION: Single live intrauterine gestation of 7 weeks 0 days. Possible small
subchorionic hemorrhage is noted.

## 2024-02-09 MED ORDER — DICLOXACILLIN SODIUM 500 MG PO CAPS: 500.0000 mg | ORAL_CAPSULE | Freq: Four times a day (QID) | ORAL | 1 refills | Status: AC

## 2024-04-17 ENCOUNTER — Ambulatory Visit

## 2024-05-02 ENCOUNTER — Other Ambulatory Visit: Payer: Self-pay

## 2024-05-02 DIAGNOSIS — O219 Vomiting of pregnancy, unspecified: Secondary | ICD-10-CM

## 2024-05-02 MED ORDER — PROMETHAZINE HCL 25 MG PO TABS: 25.0000 mg | ORAL_TABLET | Freq: Four times a day (QID) | ORAL | 2 refills | Status: AC | PRN

## 2024-05-03 ENCOUNTER — Encounter: Payer: Self-pay | Admitting: Obstetrics and Gynecology

## 2024-05-03 ENCOUNTER — Ambulatory Visit (INDEPENDENT_AMBULATORY_CARE_PROVIDER_SITE_OTHER)

## 2024-05-03 ENCOUNTER — Other Ambulatory Visit: Payer: Self-pay

## 2024-05-03 ENCOUNTER — Other Ambulatory Visit (HOSPITAL_COMMUNITY)
Admission: RE | Admit: 2024-05-03 | Discharge: 2024-05-03 | Disposition: A | Discharge status: Home / Self Care | Source: Ambulatory Visit | Attending: Family Medicine | Admitting: Family Medicine

## 2024-05-03 VITALS — BP 111/70 | HR 89 | Wt 133.0 lb

## 2024-05-03 DIAGNOSIS — Z3480 Encounter for supervision of other normal pregnancy, unspecified trimester: Secondary | ICD-10-CM | POA: Insufficient documentation

## 2024-05-03 DIAGNOSIS — Z3401 Encounter for supervision of normal first pregnancy, first trimester: Secondary | ICD-10-CM | POA: Insufficient documentation

## 2024-05-03 DIAGNOSIS — Z3A01 Less than 8 weeks gestation of pregnancy: Secondary | ICD-10-CM | POA: Diagnosis not present

## 2024-05-03 LAB — POCT URINE PREGNANCY: Preg Test, Ur: POSITIVE — AB

## 2024-05-03 MED ORDER — ONDANSETRON 4 MG PO TBDP: 4.0000 mg | ORAL_TABLET | Freq: Four times a day (QID) | ORAL | 3 refills | Status: AC | PRN

## 2024-05-03 NOTE — Addendum Note (Signed)
 Addended by: BARBRA LANG PARAS on: 05/03/2024 12:14 PM   Modules accepted: Orders

## 2024-05-03 NOTE — Progress Notes (Signed)
 New OB Intake  I explained I am completing New OB Intake today. We discussed EDD of 12/23/2024, by Last Menstrual Period. Pt is [id number]. I reviewed her allergies, medications and Medical/Surgical/OB history.    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  Patient also informed that the ultrasound is not being completed with the intent of assessing for fetal or placental anomalies or any pelvic abnormalities. Explained that the purpose of today's ultrasound is to assess for viability.  Patient acknowledges the purpose of the exam and the limitations of the study.     Delivery Plans Plans to deliver at [facility]. Discussed the nature of our practice with multiple providers including residents and students. Due to the size of the practice, the delivering provider may not be the same as those providing prenatal care.   MyChart/Babyscripts MyChart access verified. I explained pt will have some visits in office and some virtually. Babyscripts app discussed and ordered.   Blood Pressure Cuff Blood pressure cuff discussedDiscussed to be used for virtual visits and or if needed BP checks weekly.  Anatomy US  Explained first scheduled US  will be around 19 weeks.   Last Pap Diagnosis  Date Value Ref Range Status  12/02/2021 (A)  Final   - High grade squamous intraepithelial lesion (HSIL)    First visit review I reviewed new OB appt with patient. Explained pt will be seen by Dr. Abigail at first visit.  Discussed [place] genetic screening with patient. Routine prenatal labs ordered.    Erminio DELENA Rumps, [place] 05/03/2024  10:04 AM

## 2024-05-03 NOTE — Progress Notes (Signed)
 Patient seen - has been taking Prometrium 200 mg twice a day in order to lengthen out her luteal phase while she was breast-feeding.  This was prescribed to her by a physician virtually.  She does feel very tired and has a lot of nausea.  At this point, checking progesterone labs would not be beneficial as she is taking oral Prometrium.  Will have her decrease the amount of Prometrium to 200 mg daily at night, which should be sufficient to maintain the pregnancy.  She should be able to stop at the completion of first trimester.  She is quite nauseated so we will give her Zofran  as this will be less sedating.  We discussed possibility of constipation

## 2024-05-03 NOTE — Addendum Note (Signed)
 Addended by: VENUS AMERICA A on: 05/03/2024 10:08 AM   Modules accepted: Orders

## 2024-05-04 ENCOUNTER — Ambulatory Visit: Payer: Self-pay | Admitting: Family Medicine

## 2024-05-04 LAB — CBC/D/PLT+RPR+RH+ABO+RUBIGG...
Antibody Screen: NEGATIVE
Basophils Absolute: 0 x10E3/uL (ref 0.0–0.2)
Basos: 1 %
EOS (ABSOLUTE): 0.1 x10E3/uL (ref 0.0–0.4)
Eos: 1 %
HCV Ab: NONREACTIVE
HIV Screen 4th Generation wRfx: NONREACTIVE
Hematocrit: 40.2 % (ref 34.0–46.6)
Hemoglobin: 12.8 g/dL (ref 11.1–15.9)
Hepatitis B Surface Ag: NEGATIVE
Immature Grans (Abs): 0 x10E3/uL (ref 0.0–0.1)
Immature Granulocytes: 0 %
Lymphocytes Absolute: 1.6 x10E3/uL (ref 0.7–3.1)
Lymphs: 19 %
MCH: 29.7 pg (ref 26.6–33.0)
MCHC: 31.8 g/dL (ref 31.5–35.7)
MCV: 93 fL (ref 79–97)
Monocytes Absolute: 0.5 x10E3/uL (ref 0.1–0.9)
Monocytes: 6 %
Neutrophils Absolute: 6.3 x10E3/uL (ref 1.4–7.0)
Neutrophils: 73 %
Platelets: 293 x10E3/uL (ref 150–450)
RBC: 4.31 x10E6/uL (ref 3.77–5.28)
RDW: 12.2 % (ref 11.7–15.4)
RPR Ser Ql: NONREACTIVE
Rh Factor: POSITIVE
Rubella Antibodies, IGG: 0.95 {index} — ABNORMAL LOW (ref >0.99)
WBC: 8.5 x10E3/uL (ref 3.4–10.8)

## 2024-05-04 LAB — HCV INTERPRETATION

## 2024-05-04 LAB — CERVICOVAGINAL ANCILLARY ONLY
Chlamydia: NEGATIVE
Comment: NEGATIVE
Comment: NORMAL
Neisseria Gonorrhea: NEGATIVE

## 2024-05-05 ENCOUNTER — Encounter: Payer: Self-pay | Admitting: Family Medicine

## 2024-05-05 DIAGNOSIS — O09899 Supervision of other high risk pregnancies, unspecified trimester: Secondary | ICD-10-CM | POA: Insufficient documentation

## 2024-05-05 LAB — CULTURE, OB URINE

## 2024-05-05 LAB — URINE CULTURE, OB REFLEX

## 2024-05-08 ENCOUNTER — Other Ambulatory Visit: Payer: Self-pay

## 2024-05-08 DIAGNOSIS — O3680X0 Pregnancy with inconclusive fetal viability, not applicable or unspecified: Secondary | ICD-10-CM

## 2024-05-08 DIAGNOSIS — Z3401 Encounter for supervision of normal first pregnancy, first trimester: Secondary | ICD-10-CM

## 2024-05-09 ENCOUNTER — Encounter: Payer: Self-pay | Admitting: Obstetrics and Gynecology

## 2024-05-09 ENCOUNTER — Other Ambulatory Visit

## 2024-05-09 ENCOUNTER — Other Ambulatory Visit: Payer: Self-pay

## 2024-05-09 ENCOUNTER — Other Ambulatory Visit: Payer: Self-pay | Admitting: Family Medicine

## 2024-05-09 DIAGNOSIS — Z3401 Encounter for supervision of normal first pregnancy, first trimester: Secondary | ICD-10-CM

## 2024-05-09 DIAGNOSIS — Z3A01 Less than 8 weeks gestation of pregnancy: Secondary | ICD-10-CM | POA: Diagnosis not present

## 2024-05-10 ENCOUNTER — Other Ambulatory Visit

## 2024-05-15 ENCOUNTER — Ambulatory Visit: Payer: Self-pay | Admitting: Family Medicine

## 2024-05-18 MED ORDER — PROGESTERONE 200 MG PO CAPS: 200.0000 mg | ORAL_CAPSULE | Freq: Every day | ORAL | 1 refills | Status: DC

## 2024-05-28 ENCOUNTER — Encounter: Payer: Self-pay | Admitting: Obstetrics and Gynecology

## 2024-06-02 ENCOUNTER — Other Ambulatory Visit

## 2024-06-02 ENCOUNTER — Encounter: Admitting: Obstetrics and Gynecology

## 2024-06-05 ENCOUNTER — Ambulatory Visit (INDEPENDENT_AMBULATORY_CARE_PROVIDER_SITE_OTHER): Admitting: Obstetrics and Gynecology

## 2024-06-05 ENCOUNTER — Encounter: Admitting: Obstetrics and Gynecology

## 2024-06-05 VITALS — BP 127/75 | HR 112 | Wt 144.0 lb

## 2024-06-05 DIAGNOSIS — Z148 Genetic carrier of other disease: Secondary | ICD-10-CM

## 2024-06-05 DIAGNOSIS — Z1332 Encounter for screening for maternal depression: Secondary | ICD-10-CM

## 2024-06-05 DIAGNOSIS — F319 Bipolar disorder, unspecified: Secondary | ICD-10-CM | POA: Diagnosis not present

## 2024-06-05 DIAGNOSIS — Z8742 Personal history of other diseases of the female genital tract: Secondary | ICD-10-CM | POA: Diagnosis not present

## 2024-06-05 DIAGNOSIS — O99340 Other mental disorders complicating pregnancy, unspecified trimester: Secondary | ICD-10-CM

## 2024-06-05 DIAGNOSIS — Z3480 Encounter for supervision of other normal pregnancy, unspecified trimester: Secondary | ICD-10-CM

## 2024-06-05 DIAGNOSIS — Z3A11 11 weeks gestation of pregnancy: Secondary | ICD-10-CM | POA: Diagnosis not present

## 2024-06-05 NOTE — Progress Notes (Signed)
 Chief Complaint  Patient presents with   Initial Prenatal Visit    Subjective:   Julie Wilkinson is a 28 y.o. [id number] at [redacted]w[redacted]d by LMP c/w 7 wk ultrasound being seen today for her first obstetrical visit.    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  Taking progesterone for luteal phase defect prior to her pregnancy, has continued.  Hx SVD x 3  Patient does intend to breast feed. Pregnancy history fully reviewed.  Patient reports nausea that is improving.     06/05/2024   11:18 AM 04/28/2022    9:12 AM 12/18/2021    3:37 PM 12/02/2021   10:24 AM 08/31/2019   11:44 AM  Depression screen PHQ 2/9  Decreased Interest 0 0 3 2 2   Down, Depressed, Hopeless 0 2 3 2 1   PHQ - 2 Score 0 2 6 4 3   Altered sleeping 0 0 3 1 2   Tired, decreased energy 3 3 3 3 3   Change in appetite 0 0 2 1 1   Feeling bad or failure about yourself  0 0 1 0 0  Trouble concentrating 0 0 2 1 2   Moving slowly or fidgety/restless 0 0 1 0 0  Suicidal thoughts 0 0 0 0 0  PHQ-9 Score 3 5 18 10 11   Difficult doing work/chores  Not difficult at all  Not difficult at all       06/05/2024   11:19 AM 04/28/2022    9:14 AM 12/18/2021    3:38 PM 12/02/2021   10:25 AM  GAD 7 : Generalized Anxiety Score  Nervous, Anxious, on Edge 1 2 3 1   Control/stop worrying 1 1 2 1   Worry too much - different things 1 1 3 1   Trouble relaxing 1 1 3 1   Restless 0 0 1 0  Easily annoyed or irritable 0 1 3 1   Afraid - awful might happen 0 0 0 0  Total GAD 7 Score 4 6 15 5   Anxiety Difficulty  Not difficult at all  Not difficult at all      HISTORY: OB History  Gravida Para Term Preterm AB Living   6 3 3  0 2 3  SAB IAB Ectopic Multiple Live Births  1 1 0 0 3    # Outcome Date GA Lbr Len/2nd Weight Sex Type Anes PTL Lv  6 Current           5 Term 06/16/22 [redacted]w[redacted]d 04:03 / 00:14 7 lb 10.4 oz (3.47 kg) M Vag-Spont EPI N LIV     Name: Duignan,BOY Teasia     Apgar1: 9  Apgar5: 9  4 Term 09/04/19 [redacted]w[redacted]d  6 lb 4 oz (2.835 kg) F Vag-Spont EPI N LIV  3 IAB 10/06/18          2 Term 08/29/14 [redacted]w[redacted]d  7 lb 8 oz (3.402 kg) M Vag-Spont EPI N LIV  1 SAB 2015             Last pap smear: Lab Results  Component Value Date   DIAGPAP (A) 12/02/2021    - High grade squamous intraepithelial lesion (HSIL)    Past Medical History:  Diagnosis Date   Anxiety 2017  Bipolar 2021   Past Surgical History:  Procedure Laterality Date   HYMENECTOMY  28 years old   Family History  Problem Relation Age of Onset   Bipolar disorder Mother    Depression Father    Bipolar disorder Sister    Asthma Neg Hx    Cancer Neg Hx    Diabetes Neg Hx    Heart disease Neg Hx    Hypertension Neg Hx    Stroke Neg Hx    Social History   Tobacco Use   Smoking status: Former    Current packs/day: 0.00    Types: Cigarettes    Quit date: 2015    Years since quitting: 10.8   Smokeless tobacco: Never  Vaping Use   Vaping status: Never Used  Substance Use Topics   Alcohol use: Not Currently   Drug use: No   Allergies  Allergen Reactions   Loracarbef Hives   Current Outpatient Medications on File Prior to Visit  Medication Sig Dispense Refill   ondansetron  (ZOFRAN -ODT) 4 MG disintegrating tablet Take 1 tablet (4 mg total) by mouth every 6 (six) hours as needed for nausea. 40 tablet 3   Prenatal Vit-Fe Fumarate-FA (PRENATAL VITAMINS PO) Take 1 capsule by mouth daily.     progesterone (PROMETRIUM) 200 MG capsule Take 1 capsule (200 mg total) by mouth daily. 30 capsule 1   promethazine  (PHENERGAN ) 25 MG tablet Take 1 tablet (25 mg total) by mouth every 6 (six) hours as needed for nausea or vomiting. 30 tablet 2    No current facility-administered medications on file prior to visit.     Exam   Vitals:   06/05/24 1103  BP: 127/75  Pulse: (!) 112  Weight: 144 lb (65.3 kg)   Fetal Heart Rate (bpm): 166  General:  Alert, oriented and cooperative. Patient is in no acute distress.  Breast: deferred  Cardiovascular: Normal heart rate noted  Respiratory: Normal respiratory effort, no problems with respiration noted  Abdomen: Soft, gravid, appropriate for gestational age.  Pain/Pressure: Absent     Pelvic: Cervical exam deferred        Extremities: Normal range of motion.  Edema: None  Mental Status: Normal mood and affect. Normal behavior. Normal judgment and thought content.    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 Supervision of other normal pregnancy, antepartum (Primary) Anticipatory guidance Ok to discontinue progesterone - HgB A1c - PANORAMA PRENATAL TEST  2. Bipolar disease during pregnancy, antepartum (HCC) Mood overall good but requests BH referral - Ambulatory referral to Integrated Behavioral Health  3. [redacted] weeks gestation of pregnancy - HgB A1c - PANORAMA PRENATAL TEST  4. History of abnormal cervical Pap smear Reports normal pap 1.5 years ago, reports that she should still have pap today, she declines today but requests to do it next visit  5. 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 Reviewed genetics referral and FOB  testing and she is interested in this - AMB MFM GENETICS REFERRAL    Initial labs drawn. Continue  prenatal vitamins. Genetic Screening discussed: NIPS, carrier screening and AFP, requested. Previous carrier screening in 2020. Ultrasound discussed; fetal anatomic survey: requested. Problem list reviewed and updated. The nature of [facility] with multiple MDs and other Advanced Practice Providers was explained to patient; also emphasized that residents, students are part of our team. Routine obstetric precautions reviewed. Return in about 4 weeks (around 07/03/2024).  Rollo ONEIDA Bring, MD, FACOG Obstetrician & Gynecologist, [facility] for [facility], [facility]

## 2024-06-06 ENCOUNTER — Ambulatory Visit: Payer: Self-pay | Admitting: Obstetrics and Gynecology

## 2024-06-06 DIAGNOSIS — Z3480 Encounter for supervision of other normal pregnancy, unspecified trimester: Secondary | ICD-10-CM

## 2024-06-06 LAB — HEMOGLOBIN A1C
Est. average glucose Bld gHb Est-mCnc: 91 mg/dL
Hgb A1c MFr Bld: 4.8 % (ref 4.8–5.6)

## 2024-06-10 LAB — PANORAMA PRENATAL TEST FULL PANEL:PANORAMA TEST PLUS 5 ADDITIONAL MICRODELETIONS: FETAL FRACTION: 7.4

## 2024-06-12 ENCOUNTER — Encounter: Admitting: Obstetrics & Gynecology

## 2024-06-16 ENCOUNTER — Encounter: Payer: Self-pay | Admitting: Obstetrics and Gynecology

## 2024-06-21 ENCOUNTER — Ambulatory Visit

## 2024-07-03 ENCOUNTER — Ambulatory Visit (INDEPENDENT_AMBULATORY_CARE_PROVIDER_SITE_OTHER): Admitting: Obstetrics and Gynecology

## 2024-07-03 ENCOUNTER — Other Ambulatory Visit (HOSPITAL_COMMUNITY)
Admission: RE | Admit: 2024-07-03 | Discharge: 2024-07-03 | Disposition: A | Source: Ambulatory Visit | Attending: Obstetrics and Gynecology | Admitting: Obstetrics and Gynecology

## 2024-07-03 VITALS — BP 105/79 | HR 107 | Wt 150.1 lb

## 2024-07-03 DIAGNOSIS — Z8742 Personal history of other diseases of the female genital tract: Secondary | ICD-10-CM

## 2024-07-03 DIAGNOSIS — Z3A15 15 weeks gestation of pregnancy: Secondary | ICD-10-CM | POA: Diagnosis not present

## 2024-07-03 DIAGNOSIS — Z3480 Encounter for supervision of other normal pregnancy, unspecified trimester: Secondary | ICD-10-CM | POA: Insufficient documentation

## 2024-07-03 DIAGNOSIS — F319 Bipolar disorder, unspecified: Secondary | ICD-10-CM | POA: Diagnosis not present

## 2024-07-03 DIAGNOSIS — Z148 Genetic carrier of other disease: Secondary | ICD-10-CM

## 2024-07-03 NOTE — Progress Notes (Signed)
 PRENATAL VISIT NOTE  Subjective:  Julie Wilkinson is a 28 y.o. [phone] at [redacted]w[redacted]d being seen today for ongoing prenatal care.  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  Patient reports some increase in anxiety. Hasn't heard from [facility] yet.  Contractions: Not present. Vag. Bleeding: None.   . Denies leaking of fluid.   The following portions of the patient's history were reviewed and updated as appropriate: allergies, current medications, past family history, past medical history, past social history, past surgical history and problem list.   Objective:   Vitals:   07/03/24 1113  BP: 105/79  Pulse: (!) 107  Weight: 150 lb 1.3 oz (68.1 kg)    Fetal Status:  Fetal Heart Rate (bpm): 146        General: Alert, oriented and cooperative. Patient is in no acute distress.  Skin: Skin is warm and dry. No rash noted.   Cardiovascular: Normal heart rate noted  Respiratory: Normal respiratory effort, no problems with respiration noted  Abdomen: Soft, gravid, appropriate for gestational age.  Pain/Pressure: Absent     Pelvic: EGBUS within normal limits, Cervix closed, normal without lesions, pap collected. Chaperone present for exam        Extremities: Normal range of motion.  Edema: None  Mental Status: Normal mood and affect. Normal behavior. Normal judgment and thought content.      06/05/2024   11:18 AM 04/28/2022    9:12 AM 12/18/2021    3:37 PM  Depression screen PHQ 2/9  Decreased Interest 0 0 3  Down, Depressed, Hopeless 0 2 3  PHQ - 2 Score 0 2 6  Altered sleeping 0 0 3  Tired,  decreased energy 3 3 3   Change in appetite 0 0 2  Feeling bad or failure about yourself  0 0 1  Trouble concentrating 0 0 2  Moving slowly or fidgety/restless 0 0 1  Suicidal thoughts 0 0 0  PHQ-9 Score 3  5  18    Difficult doing work/chores  Not difficult at all      Data saved with a previous flowsheet row definition        06/05/2024   11:19 AM 04/28/2022    9:14 AM 12/18/2021    3:38 PM 12/02/2021   10:25 AM  GAD 7 : Generalized Anxiety Score  Nervous, Anxious, on Edge 1 2 3 1   Control/stop worrying 1 1 2 1   Worry too much - different things 1 1 3 1   Trouble relaxing 1 1 3 1   Restless 0 0 1 0  Easily annoyed or irritable 0 1 3 1   Afraid - awful might happen 0 0 0 0  Total GAD 7 Score 4 6 15 5   Anxiety Difficulty  Not difficult at all  Not difficult at all    Assessment and Plan:  Pregnancy: [id number] at [redacted]w[redacted]d 1. Supervision of other normal pregnancy, antepartum (Primary) Anticpatory guidance AFP today Anatomy ultrasound scheduled  2. Bipolar disease during pregnancy, antepartum (HCC) No meds currently, increase in anxiety, will reach out to [facility] program. Reviewed walk in hours. Also discussed anxiety medications but in patient with bipolar, can trigger manic episodes so would defer to mental  health professional  3. 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 History of abnormal cervical Pap smear Pap today  5. [redacted] weeks gestation of pregnancy   Preterm labor symptoms and general obstetric precautions including but not limited to vaginal bleeding, contractions, leaking of fluid and fetal movement were reviewed in detail with the patient. Please refer to After Visit Summary for other counseling recommendations.   No follow-ups on file.  Future Appointments  Date Time Provider Department Center  07/31/2024 11:15 AM Eveline Lynwood MATSU, MD [facility] None  08/07/2024  2:00 PM [facility] PROVIDER 1 [facility]  08/07/2024   2:30 PM [facility] US2 [facility]  08/28/2024  4:10 PM Eveline Lynwood MATSU, MD [facility] None    Rollo ONEIDA Bring, MD

## 2024-07-05 ENCOUNTER — Ambulatory Visit: Payer: Self-pay | Admitting: Obstetrics and Gynecology

## 2024-07-05 DIAGNOSIS — R87613 High grade squamous intraepithelial lesion on cytologic smear of cervix (HGSIL): Secondary | ICD-10-CM

## 2024-07-05 DIAGNOSIS — Z3480 Encounter for supervision of other normal pregnancy, unspecified trimester: Secondary | ICD-10-CM

## 2024-07-05 LAB — AFP, SERUM, OPEN SPINA BIFIDA
AFP MoM: 0.78
AFP Value: 22.3 ng/mL
Gest. Age on Collection Date: 15 wk
Maternal Age At EDD: 30.9 a
OSBR Risk 1 IN: 10000
Test Results:: NEGATIVE
Weight: 150 [lb_av]

## 2024-07-07 LAB — CYTOLOGY - PAP
Comment: NEGATIVE
Comment: NEGATIVE
Comment: NEGATIVE
Diagnosis: HIGH — AB
HPV 16: NEGATIVE
HPV 18 / 45: POSITIVE — AB
High risk HPV: POSITIVE — AB

## 2024-07-10 ENCOUNTER — Encounter: Payer: Self-pay | Admitting: Obstetrics and Gynecology

## 2024-07-10 ENCOUNTER — Ambulatory Visit: Admitting: Licensed Clinical Social Worker

## 2024-07-10 ENCOUNTER — Telehealth: Payer: Self-pay

## 2024-07-10 DIAGNOSIS — F319 Bipolar disorder, unspecified: Secondary | ICD-10-CM

## 2024-07-10 NOTE — Telephone Encounter (Signed)
 Patient called in to schedule her colpo. Informed patient that our December schedule is completely full due to the upcoming holiday and procedures have designated times allotted to them. Offered to reschedule her appointment on 1/19 with Dr. Eveline to 1/21 with Dr. Abigail and the patient wanted an ASAP appointment.   The next available spot was 1/7 with Dr. Abigail. Scheduled patient for just the colpo on that date.

## 2024-07-10 NOTE — BH Specialist Note (Unsigned)
 Integrated Behavioral Health via Telemedicine Visit  07/11/2024 Julie Wilkinson [id number]  Number of Integrated Behavioral Health Clinician visits: 1- Initial Visit  Session Start time: 1045   Session End time: 1115  Total time in minutes: 30    Referring Provider: Abigail, MD Patient/Family location: Home [facility] Provider location: Remote Office All persons participating in visit: Patient and [facility] Types of Service: Individual psychotherapy and Video visit  I connected with Julie Wilkinson and/or Julie Wilkinson's patient via  Telephone or Engineer, Civil (consulting)  (Video is Caregility application) and verified that I am speaking with the correct person using two identifiers. Discussed confidentiality: Yes   I discussed the limitations of telemedicine and the availability of in person appointments.  Discussed there is a possibility of technology failure and discussed alternative modes of communication if that failure occurs.  I discussed that engaging in this telemedicine visit, they consent to the provision of behavioral healthcare and the services will be billed under their insurance.  Patient and/or legal guardian expressed understanding and consented to Telemedicine visit: Yes   Presenting Concerns: Patient and/or family reports the following symptoms/concerns:  Duration of problem: Months; Severity of problem: moderate  Patient and/or Family's Strengths/Protective Factors: Social and Emotional competence, Concrete supports in place (healthy food, safe environments, etc.), Physical Health (exercise, healthy diet, medication compliance, etc.), and Caregiver has knowledge of parenting & child development  Goals Addressed: Patient will:  Reduce symptoms of: anxiety and mood instability   Increase knowledge and/or ability of: coping skills, healthy habits, and self-management skills   Demonstrate ability to: Increase healthy adjustment to current life circumstances and Increase  adequate support systems for patient/family  Progress towards Goals: Ongoing    Interventions: Interventions utilized:  Mindfulness or Management Consultant, Medication Monitoring, Supportive Counseling, Psychoeducation and/or Health Education, and Supportive Reflection Standardized Assessments completed: Edinburgh Postnatal Depression    Patient and/or Family Response: The patient was present for today's virtual session and reported a recent diagnosis of Bipolar I disorder, noting that she is currently experiencing significant anxiety. She described heightened health-related anxiety connected to abnormal Pap smears and expressed concerns about becoming increasingly germaphobic. The patient stated that she is not currently taking medication for bipolar disorder and feels she has been managing those symptoms adequately; however, she endorsed a marked increase in anxiety and expressed a desire to begin medication to address it. She reported that coping strategies such as deep breathing have been ineffective and that she is increasingly hyperfocused on her anxiety, with associated symptoms including elevated heart rate, difficulty sleeping, and decreased appetite. The patient indicated that she would prefer to start medication, as she feels she is beginning to spiral and does not want to rely solely on coping skills. She expressed understanding and agreement with exploring medication options that can support both bipolar disorder and anxiety.   Clinical Assessment/Diagnosis  Bipolar 1 disorder (HCC)    Assessment: Patient currently experiencing significant and escalating anxiety, particularly related to her health, accompanied by physical symptoms such as increased heart rate, difficulty sleeping, and reduced appetite. She reports feeling overwhelmed and hyperfocused on her anxiety and is seeking medication support.   Patient may benefit from continued support of integrated behavioral health  services.  Plan: Follow up with behavioral health clinician on : 07/17/2024 Behavioral recommendations: Patient to begin exploring medication management to address her increasing anxiety in the context of her Bipolar I diagnosis and continue engaging in therapy for ongoing support. She is encouraged to work collaboratively with her  prescribing provider to identify a medication regimen that effectively targets both mood stability and anxiety symptoms. Referral(s): Integrated [employer] (In Clinic)  I discussed the assessment and treatment plan with the patient and/or parent/guardian. They were provided an opportunity to ask questions and all were answered. They agreed with the plan and demonstrated an understanding of the instructions.   They were advised to call back or seek an in-person evaluation if the symptoms worsen or if the condition fails to improve as anticipated.  Nereyda Bowler LITTIE Seats, LCSWA

## 2024-07-13 ENCOUNTER — Telehealth: Payer: Self-pay

## 2024-07-13 NOTE — Telephone Encounter (Signed)
 Received voicemail regarding her not being able to schedule her colposcopy at [facility] and wanting to reschedule her appointment on 1/7 if available.   Called her back and left a voicemail letting her know that unfortunately Dr. Bolivar schedule for 1/7 is full.   Let her know that we could reschedule her 1/19 [facility] appointment with Dr. Eveline to 1/21 to be with Dr. Abigail. Gave patient our contact information to call back.

## 2024-07-14 NOTE — Telephone Encounter (Signed)
 Returned patient's phone call to schedule colposcopy. Patient stated that moving OB appointment to 1/21 is okay in her voicemail to us .   I went ahead and moved her appointment while there was still availability. Left voicemail for patient and apologized for the phone tag. Informed her that I moved her appointment and to call back if that time does not work.

## 2024-07-17 ENCOUNTER — Encounter: Admitting: Licensed Clinical Social Worker

## 2024-07-17 ENCOUNTER — Encounter: Payer: Self-pay | Admitting: Obstetrics and Gynecology

## 2024-07-24 ENCOUNTER — Telehealth: Payer: Self-pay

## 2024-07-24 NOTE — Telephone Encounter (Signed)
 Per DPR, left detailed message with Dr. Bolivar response to ordering Lamictal.  Erminio DELENA Rumps, RN

## 2024-07-25 ENCOUNTER — Encounter: Payer: Self-pay | Admitting: Obstetrics and Gynecology

## 2024-07-25 ENCOUNTER — Other Ambulatory Visit: Payer: Self-pay | Admitting: Obstetrics and Gynecology

## 2024-07-25 ENCOUNTER — Telehealth: Payer: Self-pay | Admitting: *Deleted

## 2024-07-25 DIAGNOSIS — F319 Bipolar disorder, unspecified: Secondary | ICD-10-CM

## 2024-07-25 NOTE — Telephone Encounter (Signed)
 Returned call from 07/24/2024 at 1:41 PM. Left patient a detailed message about scheduling.

## 2024-07-31 ENCOUNTER — Encounter: Admitting: Obstetrics & Gynecology

## 2024-07-31 ENCOUNTER — Ambulatory Visit: Admitting: Obstetrics and Gynecology

## 2024-07-31 ENCOUNTER — Telehealth: Payer: Self-pay | Admitting: *Deleted

## 2024-07-31 VITALS — BP 108/73 | HR 124 | Wt 158.0 lb

## 2024-07-31 DIAGNOSIS — R87613 High grade squamous intraepithelial lesion on cytologic smear of cervix (HGSIL): Secondary | ICD-10-CM | POA: Diagnosis not present

## 2024-07-31 DIAGNOSIS — Z3A19 19 weeks gestation of pregnancy: Secondary | ICD-10-CM

## 2024-07-31 DIAGNOSIS — F319 Bipolar disorder, unspecified: Secondary | ICD-10-CM

## 2024-07-31 DIAGNOSIS — O99342 Other mental disorders complicating pregnancy, second trimester: Secondary | ICD-10-CM | POA: Diagnosis not present

## 2024-07-31 DIAGNOSIS — Z3480 Encounter for supervision of other normal pregnancy, unspecified trimester: Secondary | ICD-10-CM

## 2024-07-31 DIAGNOSIS — Z2839 Other underimmunization status: Secondary | ICD-10-CM

## 2024-07-31 DIAGNOSIS — F4310 Post-traumatic stress disorder, unspecified: Secondary | ICD-10-CM

## 2024-07-31 DIAGNOSIS — Z148 Genetic carrier of other disease: Secondary | ICD-10-CM

## 2024-07-31 NOTE — Telephone Encounter (Signed)
 Left patient a message to call the office about OB appointment for today.

## 2024-07-31 NOTE — Progress Notes (Signed)
" ° °  PRENATAL VISIT NOTE  Subjective:  Julie Wilkinson is a 28 y.o. [phone] at [redacted]w[redacted]d being seen today for ongoing prenatal care.  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  Transfer of care from [facility]. Patient reports doing ok overall. Still having anxiety and would like to start medications but does not have PCP/psychiatrist. She is scheduled for new pt appt with psychiatry 2/23.  Contractions: Not present. Vag. Bleeding: None.  Movement: Present. Denies leaking of fluid.   The following portions of the patient's history were reviewed and updated as appropriate: allergies, current medications, past family history, past medical history, past social history, past surgical history and problem list.   Objective:   Vitals:   07/31/24 1458 07/31/24 1501  BP: 108/73   Pulse: (!) 121 (!) 124  Weight: 158 lb (71.7 kg)    Fetal Status: Fetal Heart Rate (bpm): 148   Movement: Present     General:  Alert, oriented and cooperative. Patient is in no acute distress.  Skin: Skin is warm and dry. No rash noted.   Cardiovascular: Normal heart rate noted  Respiratory: Normal respiratory effort, no problems with respiration noted  Abdomen: Soft, gravid, appropriate for gestational age.  Pain/Pressure: Absent      Assessment and Plan:  Pregnancy: [id number] at [redacted]w[redacted]d 1. Supervision of other normal pregnancy, antepartum (Primary) 2. [redacted] weeks gestation of pregnancy Anatomy scheduled 12/23  3. Bipolar disease during pregnancy, antepartum (HCC) 4. PTSD (post-traumatic stress disorder) Main concern is anxiety. No concerns about mania/hypomania at this time. Has  never required hospitalization for BPD. History of SI, but no attempts. Started lithium at that time of her SI which was during pregnancy. It worked well and she was on it for a few months during her 2nd to last pregnancy. Has been off of medications since then (2021).  Psychiatry appt scheduled 2/23 -- will route message to see if they are able to give sooner recs/appt  5. Rubella non-immune status, antepartum PP MMR  6. HSIL (high grade squamous intraepithelial lesion) on Pap smear of cervix +HPV 18/45 -- moved up colpo to 1/5  7. 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 MFM scheduled tomorrow  Please refer to After Visit Summary for other counseling recommendations.   Future Appointments  Date Time Provider Department Center  08/01/2024  1:15 PM [facility] PROVIDER 1 [facility]  08/01/2024  1:30 PM [facility] US5 [facility]  08/14/2024  2:30 PM Erik Kieth BROCKS, MD [facility]  09/07/2024  1:30 PM Erik Kieth BROCKS, MD [facility]  10/02/2024  1:30 PM Graham Krabbe, MD [facility] None   Kieth BROCKS Erik, MD  "

## 2024-08-01 ENCOUNTER — Other Ambulatory Visit: Payer: Self-pay | Admitting: Family Medicine

## 2024-08-01 ENCOUNTER — Other Ambulatory Visit: Payer: Self-pay

## 2024-08-01 ENCOUNTER — Ambulatory Visit: Payer: Self-pay | Admitting: Licensed Clinical Social Worker

## 2024-08-01 ENCOUNTER — Ambulatory Visit: Admitting: Obstetrics and Gynecology

## 2024-08-01 ENCOUNTER — Ambulatory Visit

## 2024-08-01 ENCOUNTER — Ambulatory Visit: Attending: Family Medicine

## 2024-08-01 VITALS — BP 100/53 | HR 92

## 2024-08-01 DIAGNOSIS — Z148 Genetic carrier of other disease: Secondary | ICD-10-CM

## 2024-08-01 DIAGNOSIS — R87613 High grade squamous intraepithelial lesion on cytologic smear of cervix (HGSIL): Secondary | ICD-10-CM | POA: Diagnosis not present

## 2024-08-01 DIAGNOSIS — Z3401 Encounter for supervision of normal first pregnancy, first trimester: Secondary | ICD-10-CM | POA: Insufficient documentation

## 2024-08-01 DIAGNOSIS — Z363 Encounter for antenatal screening for malformations: Secondary | ICD-10-CM | POA: Diagnosis not present

## 2024-08-01 DIAGNOSIS — O3442 Maternal care for other abnormalities of cervix, second trimester: Secondary | ICD-10-CM

## 2024-08-01 DIAGNOSIS — F319 Bipolar disorder, unspecified: Secondary | ICD-10-CM | POA: Diagnosis not present

## 2024-08-01 DIAGNOSIS — Z3A19 19 weeks gestation of pregnancy: Secondary | ICD-10-CM

## 2024-08-01 DIAGNOSIS — O99342 Other mental disorders complicating pregnancy, second trimester: Secondary | ICD-10-CM | POA: Diagnosis not present

## 2024-08-01 DIAGNOSIS — Z362 Encounter for other antenatal screening follow-up: Secondary | ICD-10-CM

## 2024-08-01 DIAGNOSIS — Z3A01 Less than 8 weeks gestation of pregnancy: Secondary | ICD-10-CM

## 2024-08-01 NOTE — Progress Notes (Signed)
 Maternal-Fetal Medicine Consultation  Name: Julie Wilkinson  MRN: [id number]  GA: [id number] [redacted]w[redacted]d   Patient is here for fetal anatomy scan.  On cell-free fetal DNA screening, the risks of aneuploidies are not increased. MSAFP screening showed low risk for open-neural tube defects.  Obstetric history is significant for 3 term vaginal deliveries.  On Pap smear, HGSIL was detected and the patient will be undergoing colposcopy.  She does not give history of LEEP or cone biopsy. She is a carrier of MCAD (Medium-chain acyl-CoA dehydrogenase deficiency).  Ultrasound Fetal biometry is consistent with her previously-established dates. Normal amniotic fluid.  No makers of aneuploidies or fetal structural defects are seen.    On transabdominal scan, the cervix looks long and closed.  Patient understands the limitations of ultrasound in detecting fetal anomalies.   I reassured the patient of normal cervical length measurement. Abnormal Pap smears can lead to cervical insufficiency in some cases.  I discussed her genetic carrier state (MCAD).  This pregnancy is from a new partner and he has not been screened.  I recommended genetic counseling.  Patient opted not to have genetic counseling or partner screening.  Recommendations - An appointment was made for her to return in 6 weeks for completion of fetal anatomy.     Consultation including face-to-face (more than 50%) counseling 20 minutes.

## 2024-08-07 ENCOUNTER — Ambulatory Visit

## 2024-08-07 ENCOUNTER — Other Ambulatory Visit

## 2024-08-13 ENCOUNTER — Encounter: Payer: Self-pay | Admitting: Obstetrics and Gynecology

## 2024-08-14 ENCOUNTER — Ambulatory Visit (INDEPENDENT_AMBULATORY_CARE_PROVIDER_SITE_OTHER): Admitting: Obstetrics and Gynecology

## 2024-08-14 VITALS — Wt 162.0 lb

## 2024-08-14 DIAGNOSIS — F319 Bipolar disorder, unspecified: Secondary | ICD-10-CM

## 2024-08-14 DIAGNOSIS — F4310 Post-traumatic stress disorder, unspecified: Secondary | ICD-10-CM

## 2024-08-14 DIAGNOSIS — R87613 High grade squamous intraepithelial lesion on cytologic smear of cervix (HGSIL): Secondary | ICD-10-CM | POA: Diagnosis not present

## 2024-08-14 DIAGNOSIS — Z2839 Other underimmunization status: Secondary | ICD-10-CM

## 2024-08-14 DIAGNOSIS — Z148 Genetic carrier of other disease: Secondary | ICD-10-CM

## 2024-08-14 DIAGNOSIS — Z3480 Encounter for supervision of other normal pregnancy, unspecified trimester: Secondary | ICD-10-CM | POA: Diagnosis not present

## 2024-08-14 DIAGNOSIS — Z3A21 21 weeks gestation of pregnancy: Secondary | ICD-10-CM | POA: Diagnosis not present

## 2024-08-14 DIAGNOSIS — Z3490 Encounter for supervision of normal pregnancy, unspecified, unspecified trimester: Secondary | ICD-10-CM

## 2024-08-14 MED ORDER — ESCITALOPRAM OXALATE 10 MG PO TABS: ORAL_TABLET | ORAL | 11 refills | Status: AC

## 2024-08-14 NOTE — Progress Notes (Signed)
 "  PRENATAL VISIT NOTE  Subjective:  Julie Wilkinson is a 29 y.o. [phone] at [redacted]w[redacted]d being seen today for ongoing prenatal care.  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  Patient reports still struggling with anxiety and has not been able to start medications/get connected with the appropriate resources.  Contractions: Not present. Vag. Bleeding: None.  Movement: Present. Denies leaking of fluid.   The following portions of the patient's history were reviewed and updated as appropriate: allergies, current medications, past family history, past medical history, past social history, past surgical history and problem list.   Objective:   Vitals:   08/14/24 1435  Weight: 162 lb 0.6 oz (73.5 kg)   Fetal Status: Fetal Heart Rate (bpm): 155   Movement: Present     General:  Alert, oriented and cooperative. Patient is in no acute distress.  Skin: Skin is warm and dry. No rash noted.   Cardiovascular: Normal heart rate noted  Respiratory: Normal respiratory effort, no problems with respiration noted  Abdomen: Soft, gravid, appropriate for gestational age.  Pain/Pressure: Absent      Assessment and Plan:  Pregnancy: [id number] at [redacted]w[redacted]d 1. Supervision of other normal pregnancy, antepartum (Primary) 2. [redacted] weeks gestation of pregnancy Normal anatomy, neg AFP  3. 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 final path is CIN1). If colposcopic exam predicts low grade postpartum, we could just biopsy in that case She is open to expedited treatment postpartum  4. Rubella non-immune status, antepartum PP MMR  5. 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 Bipolar 1 disorder (HCC) 7. PTSD (post-traumatic stress disorder) - Reached out to the provider she is scheduled to see in 2 months who was not able to provide recommendations for medication initiation - She saw [facility] in the past who diagnosed bipolar 1 but she has never required hospitalization for psychiatric concerns. She does not have symptoms of mania/hypomania today. She has been on prozac  in the past and her main complaint was that it didn't really address her anxiety - Recommend getting in touch with [facility] to see if they could evaluate her sooner and provide guidance on medication - We discussed initiation of lexapro . Dempsey discussion about risk of precipitating a manic/hypomanic episode with SSRI in s/o bipolar disorder. Patient reports anxiety is out of control and she would like to try something even with these risks. Given limited alternatives, we will cautiously trial lexapro . Strict precautions given -     escitalopram  (LEXAPRO ) 10 MG tablet; Take 0.5 tablets (5 mg total) by mouth daily for 7 days, THEN 1 tablet (10 mg total) daily.  Please refer to After Visit Summary for other counseling recommendations.   Return in about 4 weeks (around 09/11/2024) for return OB at 25 weeks.  Future Appointments  Date Time Provider Department Center  09/07/2024  1:30 PM Erik Kieth BROCKS, MD [facility]  09/12/2024  2:15 PM [facility] PROVIDER  1 [facility]  09/12/2024  2:30 PM [facility] US1 [facility]  10/02/2024  1:30 PM Graham Krabbe, MD [facility] None   Kieth JAYSON Carolin, MD "

## 2024-08-14 NOTE — Progress Notes (Signed)
" ° ° °  GYNECOLOGY OFFICE COLPOSCOPY PROCEDURE NOTE  29 y.o. H3E6976 here for colposcopy for HSIL/+HPV 18/45 pap smear on 07/03/24.   Pregnancy test:  not indicated  Informed consent and review of risks, benefit and alternatives performed. Written consent given.   Speculum inserted into patient's vagina assuring full view of cervix and vaginal walls. 3 swabs of vinegar solution applied to the cervix and vaginal walls and colposcope was used to observe both the cervix and vaginal walls.   Colposcopy adequate? 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   Kieth Carolin, MD Obstetrician & Gynecologist, [facility], [facility]  "

## 2024-08-16 ENCOUNTER — Encounter: Admitting: Obstetrics and Gynecology

## 2024-08-22 ENCOUNTER — Encounter: Payer: Self-pay | Admitting: Obstetrics and Gynecology

## 2024-08-22 DIAGNOSIS — M25559 Pain in unspecified hip: Secondary | ICD-10-CM

## 2024-08-22 DIAGNOSIS — M5430 Sciatica, unspecified side: Secondary | ICD-10-CM

## 2024-08-22 DIAGNOSIS — O99891 Other specified diseases and conditions complicating pregnancy: Secondary | ICD-10-CM

## 2024-08-28 ENCOUNTER — Encounter: Admitting: Obstetrics & Gynecology

## 2024-08-30 ENCOUNTER — Encounter: Admitting: Obstetrics and Gynecology

## 2024-08-31 ENCOUNTER — Ambulatory Visit: Admitting: *Deleted

## 2024-08-31 ENCOUNTER — Telehealth: Payer: Self-pay | Admitting: *Deleted

## 2024-08-31 VITALS — BP 116/77 | HR 76 | Wt 168.0 lb

## 2024-08-31 DIAGNOSIS — O26899 Other specified pregnancy related conditions, unspecified trimester: Secondary | ICD-10-CM

## 2024-08-31 LAB — POCT URINALYSIS DIPSTICK
Bilirubin, UA: NEGATIVE
Blood, UA: NEGATIVE
Glucose, UA: NEGATIVE
Ketones, UA: NEGATIVE
Nitrite, UA: NEGATIVE
Protein, UA: NEGATIVE
Spec Grav, UA: 1.015
Urobilinogen, UA: 0.2 U/dL
pH, UA: 7

## 2024-08-31 NOTE — Telephone Encounter (Signed)
 Left patient a message to call to schedule nurse visit.

## 2024-08-31 NOTE — Progress Notes (Signed)
 Patient complaining of lower abdominal pressure and pain as well as cramping that comes and goes; she says this has been going on for about a week now.  The discomfort is not constant.  Her last bowel movement was today and reports no constipation issues.  She reports no vaginal bleeding with only clear vaginal discharge without itching or burning.  Urinalysis done with results as follows:  Glu Neg Bil Neg Ket Neg SG 1.015 Blo Neg pH 7.0 Pro Negative Uro 0.2 E.U./dL Nit Negative Leu Trace   Dr. Abigail consulted; pharmacy on file verified; Urine culture sent to lab.  Treatment will be determined per MD. Patient advised of MAU precautions in the meantime.   Rosina, RN BSN

## 2024-09-02 ENCOUNTER — Other Ambulatory Visit (HOSPITAL_COMMUNITY): Payer: Self-pay

## 2024-09-02 ENCOUNTER — Encounter (HOSPITAL_COMMUNITY): Payer: Self-pay | Admitting: Obstetrics & Gynecology

## 2024-09-02 ENCOUNTER — Inpatient Hospital Stay (HOSPITAL_COMMUNITY)
Admission: AD | Admit: 2024-09-02 | Discharge: 2024-09-02 | Disposition: A | Discharge status: Home / Self Care | Attending: Obstetrics & Gynecology | Admitting: Obstetrics & Gynecology

## 2024-09-02 ENCOUNTER — Other Ambulatory Visit: Payer: Self-pay

## 2024-09-02 DIAGNOSIS — Z3A24 24 weeks gestation of pregnancy: Secondary | ICD-10-CM | POA: Diagnosis not present

## 2024-09-02 DIAGNOSIS — O2342 Unspecified infection of urinary tract in pregnancy, second trimester: Secondary | ICD-10-CM | POA: Insufficient documentation

## 2024-09-02 DIAGNOSIS — Z3689 Encounter for other specified antenatal screening: Secondary | ICD-10-CM

## 2024-09-02 LAB — URINALYSIS, ROUTINE W REFLEX MICROSCOPIC
Bilirubin Urine: NEGATIVE
Glucose, UA: NEGATIVE mg/dL
Hgb urine dipstick: NEGATIVE
Ketones, ur: NEGATIVE mg/dL
Leukocytes,Ua: NEGATIVE
Nitrite: NEGATIVE
Protein, ur: NEGATIVE mg/dL
Specific Gravity, Urine: 1.006 (ref 1.005–1.030)
pH: 6 (ref 5.0–8.0)

## 2024-09-02 LAB — CBC WITH DIFFERENTIAL/PLATELET
Abs Immature Granulocytes: 0.11 10*3/uL — ABNORMAL HIGH (ref 0.00–0.07)
Basophils Absolute: 0.1 10*3/uL (ref 0.0–0.1)
Basophils Relative: 1 %
Eosinophils Absolute: 0.1 10*3/uL (ref 0.0–0.5)
Eosinophils Relative: 1 %
HCT: 31.5 % — ABNORMAL LOW (ref 36.0–46.0)
Hemoglobin: 10.7 g/dL — ABNORMAL LOW (ref 12.0–15.0)
Immature Granulocytes: 1 %
Lymphocytes Relative: 12 %
Lymphs Abs: 1.4 10*3/uL (ref 0.7–4.0)
MCH: 31.5 pg (ref 26.0–34.0)
MCHC: 34 g/dL (ref 30.0–36.0)
MCV: 92.6 fL (ref 80.0–100.0)
Monocytes Absolute: 0.7 10*3/uL (ref 0.1–1.0)
Monocytes Relative: 6 %
Neutro Abs: 8.8 10*3/uL — ABNORMAL HIGH (ref 1.7–7.7)
Neutrophils Relative %: 79 %
Platelets: 234 10*3/uL (ref 150–400)
RBC: 3.4 MIL/uL — ABNORMAL LOW (ref 3.87–5.11)
RDW: 12.7 % (ref 11.5–15.5)
WBC: 11.1 10*3/uL — ABNORMAL HIGH (ref 4.0–10.5)
nRBC: 0 % (ref 0.0–0.2)

## 2024-09-02 LAB — WET PREP, GENITAL
Clue Cells Wet Prep HPF POC: NONE SEEN
Sperm: NONE SEEN
Trich, Wet Prep: NONE SEEN
WBC, Wet Prep HPF POC: <10
Yeast Wet Prep HPF POC: NONE SEEN

## 2024-09-02 LAB — URINE CULTURE

## 2024-09-02 MED ORDER — NITROFURANTOIN MONOHYD MACRO 100 MG PO CAPS
100.0000 mg | ORAL_CAPSULE | Freq: Two times a day (BID) | ORAL | 0 refills | Status: DC
  Filled 2024-09-02: qty 14, 7d supply, fill #0

## 2024-09-02 MED ORDER — ACETAMINOPHEN 500 MG PO TABS
1000.0000 mg | ORAL_TABLET | Freq: Once | ORAL | Status: AC
  Administered 2024-09-02: 1000 mg via ORAL
  Filled 2024-09-02: qty 2

## 2024-09-02 MED ORDER — CYCLOBENZAPRINE HCL 10 MG PO TABS: 10.0000 mg | ORAL_TABLET | Freq: Two times a day (BID) | ORAL | 0 refills | Status: AC | PRN

## 2024-09-02 MED ORDER — NITROFURANTOIN MONOHYD MACRO 100 MG PO CAPS: 100.0000 mg | ORAL_CAPSULE | Freq: Two times a day (BID) | ORAL | 0 refills | Status: AC

## 2024-09-02 MED ORDER — NIFEDIPINE 10 MG PO CAPS
10.0000 mg | ORAL_CAPSULE | ORAL | Status: DC | PRN
  Administered 2024-09-02 (×3): 10 mg via ORAL
  Filled 2024-09-02 (×3): qty 1

## 2024-09-02 NOTE — MAU Provider Note (Cosign Needed Addendum)
 Chief Complaint:  Back Pain and Cramping  HPI   Event Date/Time   Wilkinson Provider Initiated Contact with Patient 09/02/24 1308     Julie Wilkinson is a 29 y.o. H3E6976 at [redacted]w[redacted]d who presents to maternity admissions reporting lower abdominal pain with urination and especially at night, feels it all the time but does get worse sometimes - does not feel like contractions. Also has lower back pain, increased frequency, and dysuria. Has a history of pyelonephritis that required inpatient management (not in this pregnancy). Has had these symptoms for 3-4 days, went to [facility] yesterday but they said all she had were WBC in her urine and sent it for culture (which came back negative). Has been drinking copious amounts of water trying to stop the symptoms.   Denies any LOF, DFM or vaginal bleeding as well as fever, nausea/vomiting or any other physical symptoms.  Pregnancy Course: Receives care at [facility], records reviewed.  Past Medical History:  Diagnosis Date   Anxiety 2017   Bipolar 2021   OB History  Gravida Para Term Preterm AB Living  6 3 3  2 3   SAB IAB Ectopic Multiple Live Births  1 1  0 3    # Outcome Date GA Lbr Len/2nd Weight Sex Type Anes PTL Lv  6 Current           5 Term 06/16/22 [redacted]w[redacted]d 04:03 / 00:14 7 lb 10.4 oz (3.47 kg) M Vag-Spont EPI N LIV  4 Term 09/04/19 [redacted]w[redacted]d  6 lb 4 oz (2.835 kg) F Vag-Spont EPI N LIV  3 IAB 10/06/18          2 Term 08/29/14 [redacted]w[redacted]d  7 lb 8 oz (3.402 kg) M Vag-Spont EPI N LIV  1 SAB 2015           Past Surgical History:  Procedure Laterality Date   HYMENECTOMY  29 years old   Family History  Problem Relation Age of Onset   Bipolar disorder Mother    Depression Father    Bipolar disorder Sister    Asthma Neg Hx    Cancer Neg Hx    Diabetes Neg Hx    Heart disease Neg Hx    Hypertension Neg Hx    Stroke Neg Hx    Social History[1] Allergies[2] No medications prior to admission.   I have reviewed patient's Past Medical Hx, Surgical Hx, Family Hx,  Social Hx, medications and allergies.   ROS  Pertinent items noted in HPI and remainder of comprehensive ROS otherwise negative.   PHYSICAL EXAM  Patient Vitals for the past 24 hrs:  BP Temp Temp src Pulse Resp SpO2 Height Weight  09/02/24 1709 (!) 109/54 -- -- (!) 101 -- -- -- --  09/02/24 1605 (!) 106/56 -- -- -- -- -- -- --  09/02/24 1536 (!) 114/55 -- -- 94 -- -- -- --  09/02/24 1513 105/60 -- -- -- -- -- -- --  09/02/24 1310 -- -- -- -- -- 100 % -- --  09/02/24 1307 110/73 -- -- 93 -- -- -- --  09/02/24 1240 129/62 98.6 F (37 C) Oral (!) 112 18 100 % -- --  09/02/24 1234 -- -- -- -- -- -- 5' 4 (1.626 m) 171 lb 6.4 oz (77.7 kg)   Constitutional: Well-developed, well-nourished female in no acute distress.  Cardiovascular: normal rate & rhythm, warm and well-perfused Respiratory: normal effort, no problems with respiration noted GI: Abd soft, non-tender, non-distended MS: Extremities nontender, no edema,  normal ROM Neurologic: Alert and oriented x 4.  GU: no CVA tenderness, very low back (sacral area) is mildly tender to palpation Pelvic: pelvic exam deferred, RN to obtain swabs  Fetal Tracing: reactive Baseline: 150 Variability: moderate Accelerations: 10x10 (appropriate for gestational age) Decelerations: none Toco: relaxed   Labs: Results for orders placed or performed during the hospital encounter of 09/02/24 (from the past 24 hours)  Urinalysis, Routine w reflex microscopic -Urine, Clean Catch     Status: Abnormal   Collection Time: 09/02/24 12:51 PM  Result Value Ref Range   Color, Urine STRAW (A) YELLOW   APPearance CLEAR CLEAR   Specific Gravity, Urine 1.006 1.005 - 1.030   pH 6.0 5.0 - 8.0   Glucose, UA NEGATIVE NEGATIVE mg/dL   Hgb urine dipstick NEGATIVE NEGATIVE   Bilirubin Urine NEGATIVE NEGATIVE   Ketones, ur NEGATIVE NEGATIVE mg/dL   Protein, ur NEGATIVE NEGATIVE mg/dL   Nitrite NEGATIVE NEGATIVE   Leukocytes,Ua NEGATIVE NEGATIVE  Wet prep, genital      Status: None   Collection Time: 09/02/24  1:14 PM  Result Value Ref Range   Yeast Wet Prep HPF POC NONE SEEN NONE SEEN   Trich, Wet Prep NONE SEEN NONE SEEN   Clue Cells Wet Prep HPF POC NONE SEEN NONE SEEN   WBC, Wet Prep HPF POC <10 <10   Sperm NONE SEEN   CBC with Differential/Platelet     Status: Abnormal   Collection Time: 09/02/24  1:26 PM  Result Value Ref Range   WBC 11.1 (H) 4.0 - 10.5 K/uL   RBC 3.40 (L) 3.87 - 5.11 MIL/uL   Hemoglobin 10.7 (L) 12.0 - 15.0 g/dL   HCT 68.4 (L) 63.9 - 53.9 %   MCV 92.6 80.0 - 100.0 fL   MCH 31.5 26.0 - 34.0 pg   MCHC 34.0 30.0 - 36.0 g/dL   RDW 87.2 88.4 - 84.4 %   Platelets 234 150 - 400 K/uL   nRBC 0.0 0.0 - 0.2 %   Neutrophils Relative % 79 %   Neutro Abs 8.8 (H) 1.7 - 7.7 K/uL   Lymphocytes Relative 12 %   Lymphs Abs 1.4 0.7 - 4.0 K/uL   Monocytes Relative 6 %   Monocytes Absolute 0.7 0.1 - 1.0 K/uL   Eosinophils Relative 1 %   Eosinophils Absolute 0.1 0.0 - 0.5 K/uL   Basophils Relative 1 %   Basophils Absolute 0.1 0.0 - 0.1 K/uL   Immature Granulocytes 1 %   Abs Immature Granulocytes 0.11 (H) 0.00 - 0.07 K/uL   Imaging:  No results found.  MDM & MAU COURSE  MDM: Moderate  MAU Course: Orders Placed This Encounter  Procedures   Culture, OB Urine   Wet prep, genital   Urinalysis, Routine w reflex microscopic -Urine, Clean Catch   CBC with Differential/Platelet   Discharge patient Discharge disposition: 01-Home or Self Care; Discharge patient date: 09/02/2024   Discharge patient Discharge disposition: 01-Home or Self Care; Discharge patient date: 09/02/2024   Meds ordered this encounter  Medications   NIFEdipine  (PROCARDIA ) capsule 10 mg   acetaminophen  (TYLENOL ) tablet 1,000 mg   DISCONTD: nitrofurantoin , macrocrystal-monohydrate, (MACROBID ) 100 MG capsule    Sig: Take 1 capsule (100 mg total) by mouth 2 (two) times daily.    Dispense:  14 capsule    Refill:  0   nitrofurantoin , macrocrystal-monohydrate,  (MACROBID ) 100 MG capsule    Sig: Take 1 capsule (100 mg total) by mouth 2 (two)  times daily.    Dispense:  14 capsule    Refill:  0    Supervising Provider:   PRATT, TANYA S [[id number]]   cyclobenzaprine  (FLEXERIL ) 10 MG tablet    Sig: Take 1 tablet (10 mg total) by mouth 2 (two) times daily as needed for muscle spasms.    Dispense:  20 tablet    Refill:  0    Supervising Provider:   PRATT, TANYA S [[id number]]   UA and culture ordered. UA negative but on discussion pt endorses drinking >60oz of water so far today, and added how long she's been feeling these symptoms. Feels like this is a UTI so sent a script to [facility] pharmacy for antibiotics. Swabs collected and were negative.  Pt then began to complain of cramping so procardia  series and Tylenol  ordered Called to L&D to care turned over to NP.  Cornell Finder, CNM, MSN, IBCLC Certified Nurse Midwife, [facility]  Assumed care of patient at [phone], pt feeling better and ready to go home Meds delivered, pt requested flexeril  for home use so sent to pharmacy  ASSESSMENT   1. Urinary tract infection in mother during second trimester of pregnancy   2. [redacted] weeks gestation of pregnancy   3. NST (non-stress test) reactive on fetal surveillance    PLAN  Discharge home in stable condition with return precautions.     Follow-up Information     Center for [facility] Follow up.   Specialty: Obstetrics and Gynecology Why: If symptoms worsen or fail to resolve, As scheduled for ongoing prenatal care Contact information: [address] [place]  [address] [phone]                Allergies as of 09/02/2024       Reactions   Loracarbef Hives        Medication List     TAKE these medications    cyclobenzaprine  10 MG tablet Commonly known as: FLEXERIL  Take 1 tablet (10 mg total) by mouth 2 (two) times daily as needed for muscle spasms.   escitalopram  10 MG  tablet Commonly known as: Lexapro  Take 0.5 tablets (5 mg total) by mouth daily for 7 days, THEN 1 tablet (10 mg total) daily. Start taking on: August 14, 2024   nitrofurantoin  (macrocrystal-monohydrate) 100 MG capsule Commonly known as: MACROBID  Take 1 capsule (100 mg total) by mouth 2 (two) times daily.   ondansetron  4 MG disintegrating tablet Commonly known as: ZOFRAN -ODT Take 1 tablet (4 mg total) by mouth every 6 (six) hours as needed for nausea.   PRENATAL VITAMINS PO Take 1 capsule by mouth daily.   promethazine  25 MG tablet Commonly known as: PHENERGAN  Take 1 tablet (25 mg total) by mouth every 6 (six) hours as needed for nausea or vomiting.       Cornell Finder, CNM, MSN, IBCLC Certified Nurse Midwife, [facility]        [1]  Social History Tobacco Use   Smoking status: Former    Current packs/day: 0.00    Types: Cigarettes    Quit date: 2015    Years since quitting: 11.0   Smokeless tobacco: Never  Vaping Use   Vaping status: Never Used  Substance Use Topics   Alcohol use: Not Currently   Drug use: No  [2]  Allergies Allergen Reactions   Loracarbef Hives

## 2024-09-02 NOTE — MAU Note (Signed)
 Julie Wilkinson is a 29 y.o. at [redacted]w[redacted]d here in MAU reporting: she's had lower back pain and abdominal cramping for the past 3 days.  States she's having burning and frequency with  urination.  Denies VB and LOF.  Reports +FM.  LMP: 03/18/2024 Onset of complaint: 3 days Pain score: 5 back and abdomen Vitals:   09/02/24 1240  BP: 129/62  Pulse: (!) 112  Resp: 18  Temp: 98.6 F (37 C)  SpO2: 100%     FHT: 145 bpm  Lab orders placed from triage: UA

## 2024-09-03 ENCOUNTER — Encounter: Payer: Self-pay | Admitting: Obstetrics and Gynecology

## 2024-09-04 ENCOUNTER — Ambulatory Visit: Payer: Self-pay | Admitting: Obstetrics and Gynecology

## 2024-09-04 LAB — GC/CHLAMYDIA PROBE AMP (~~LOC~~) NOT AT ARMC
Chlamydia: NEGATIVE
Comment: NEGATIVE
Comment: NORMAL
Neisseria Gonorrhea: NEGATIVE

## 2024-09-04 LAB — CULTURE, OB URINE

## 2024-09-05 NOTE — Addendum Note (Signed)
 Addended by: ELAINE ROSINA SAILOR on: 09/05/2024 10:30 AM   Modules accepted: Orders

## 2024-09-05 NOTE — Telephone Encounter (Signed)
 Error

## 2024-09-06 ENCOUNTER — Ambulatory Visit: Admitting: Physical Therapy

## 2024-09-06 NOTE — BH Specialist Note (Signed)
 A user error has taken place.

## 2024-09-07 ENCOUNTER — Ambulatory Visit: Admitting: Obstetrics and Gynecology

## 2024-09-07 VITALS — BP 112/77 | HR 108 | Wt 168.0 lb

## 2024-09-07 DIAGNOSIS — Z3480 Encounter for supervision of other normal pregnancy, unspecified trimester: Secondary | ICD-10-CM

## 2024-09-07 DIAGNOSIS — Z3490 Encounter for supervision of normal pregnancy, unspecified, unspecified trimester: Secondary | ICD-10-CM | POA: Diagnosis not present

## 2024-09-07 DIAGNOSIS — F319 Bipolar disorder, unspecified: Secondary | ICD-10-CM | POA: Diagnosis not present

## 2024-09-07 DIAGNOSIS — Z148 Genetic carrier of other disease: Secondary | ICD-10-CM | POA: Diagnosis not present

## 2024-09-07 DIAGNOSIS — Z2839 Other underimmunization status: Secondary | ICD-10-CM

## 2024-09-07 DIAGNOSIS — R87613 High grade squamous intraepithelial lesion on cytologic smear of cervix (HGSIL): Secondary | ICD-10-CM | POA: Diagnosis not present

## 2024-09-07 DIAGNOSIS — Z3A24 24 weeks gestation of pregnancy: Secondary | ICD-10-CM | POA: Diagnosis not present

## 2024-09-07 NOTE — Progress Notes (Signed)
 "  PRENATAL VISIT NOTE  Subjective:  Julie Wilkinson is a 29 y.o. [phone] at [redacted]w[redacted]d being seen today for ongoing prenatal care.  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  Patient reports intermittent contractions, recent UTI.  Contractions: Irregular. Vag. Bleeding: None.  Movement: Present. Denies leaking of fluid.   The following portions of the patient's history were reviewed and updated as appropriate: allergies, current medications, past family history, past medical history, past social history, past surgical history and problem list.   Objective:   Vitals:   09/07/24 1330  BP: 112/77  Pulse: (!) 108  Weight: 168 lb (76.2 kg)    Fetal Status: Fetal Heart Rate (bpm): 148   Movement: Present     General:  Alert, oriented and cooperative. Patient is in no acute distress.  Skin: Skin is warm and dry. No rash noted.   Cardiovascular: Normal heart rate noted  Respiratory: Normal respiratory effort, no problems with respiration noted  Abdomen: Soft, gravid, appropriate for gestational age.  Pain/Pressure: Absent     Pelvic exam performed in presence of a chaperone - sterile speculum exam with visually closed cervix, normal thin discharge, no bleeding  Assessment and Plan:  Pregnancy: [id number] at [redacted]w[redacted]d  Supervision of other normal pregnancy, antepartum [redacted] weeks gestation of pregnancy Discussed 2h GTT, CBC, HIV, RPR & tdap next visit Reassuring exam today, no e/o PTL. Discussed strict PTL precautions and when to be evaluated in MAU vs office. Overview:  NURSING  PROVIDER  [employer] Dating by LMP c/w U/S at 7wks   [facility] Model Traditional Anatomy U/S normal  Initiated care at  [facility]  English              LAB RESULTS   Support Person  Genetics NIPS:LR XX AFP: neg    NT/IT (FT only)     Carrier Screen Horizon:   Rhogam  O/Positive/-- (09/24 1016) A1C/GTT Early HgbA1C: 4.8 Third trimester 2 hr GTT:   Flu Vaccine 07/05/24    TDaP Vaccine   Blood Type O/Positive/-- (09/24 1016)  RSV Vaccine  Antibody Negative (09/24 1016)  COVID Vaccine  Rubella 0.95 (09/24 1016)  Feeding Plan breast RPR Non Reactive (09/24 1016)  Contraception FOB to get Vasectomy HBsAg Negative (09/24 1016)  Circumcision Yes HIV Non Reactive (09/24 1016)  Pediatrician  [facility] HCVAb Non Reactive (09/24 1016)  Prenatal Classes No    BTL Consent  Pap Diagnosis  Date Value Ref Range Status  07/03/2024 (A)  Final   - High grade squamous intraepithelial lesion (HSIL)  NEEDS COLPO    BTL Pre-payment  GC/CT Initial:   36wks:    VBAC Consent  GBS   For PCN allergy, check sensitivities   BRx Optimized? [ ]  yes   [ ]  no    DME Rx [ ]  BP cuff [ ]  Weight Scale Waterbirth  [ ]  Class [ ]  Consent [ ]  CNM visit  PHQ9 & GAD7 [  ] new OB [  ] 28 weeks  [  ] 36 weeks Induction  [ ]  Orders Entered [ ] Foley Y/N   Bipolar  1 disorder (HCC) Overview: Dx 2020/2021 during pregnancy when she was experiencing SI. No hospitalizations or attempts. Was on lithium for a few months, then stopped and has not been on medications since F/w [place] for therapy Has appt with psychiatry scheduled 2/23 Started lexapro  1/5 - counseled on risks of precipitating hypomania/mania and strict precautions. Today she notes anxiety is improving and she is doing well with the medication. 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 It is considered a fatty acid oxidation condition because people affected with MCAD are unable to change some of the fats they eat into energy the body needs to function.*   Preterm labor symptoms and general obstetric precautions including but not limited to vaginal bleeding, contractions, leaking of fluid and fetal movement were reviewed in detail with the patient.  Please refer to After Visit Summary for other counseling recommendations.   Return in about 4 weeks (around 10/05/2024) for return OB at 28 weeks with 2h GTT, CBC/HIV/RPR, tdap.  Future Appointments  Date Time Provider Department Center  09/12/2024  2:15 PM [facility] PROVIDER 1 [facility]  09/12/2024  2:30 PM [facility] US1 [facility]  10/02/2024  1:30 PM Graham Krabbe, MD [facility] None   Julie JAYSON Carolin, MD  "

## 2024-09-12 ENCOUNTER — Ambulatory Visit

## 2024-09-12 ENCOUNTER — Ambulatory Visit: Attending: Obstetrics and Gynecology | Admitting: Obstetrics

## 2024-09-12 VITALS — BP 120/66 | HR 106

## 2024-09-12 DIAGNOSIS — O43192 Other malformation of placenta, second trimester: Secondary | ICD-10-CM | POA: Diagnosis not present

## 2024-09-12 DIAGNOSIS — F319 Bipolar disorder, unspecified: Secondary | ICD-10-CM

## 2024-09-12 DIAGNOSIS — Z3A25 25 weeks gestation of pregnancy: Secondary | ICD-10-CM | POA: Diagnosis not present

## 2024-09-12 DIAGNOSIS — O99342 Other mental disorders complicating pregnancy, second trimester: Secondary | ICD-10-CM | POA: Diagnosis not present

## 2024-09-12 DIAGNOSIS — Z362 Encounter for other antenatal screening follow-up: Secondary | ICD-10-CM

## 2024-09-12 DIAGNOSIS — O280 Abnormal hematological finding on antenatal screening of mother: Secondary | ICD-10-CM | POA: Diagnosis not present

## 2024-10-02 ENCOUNTER — Ambulatory Visit (HOSPITAL_COMMUNITY): Admitting: Psychiatry

## 2024-10-04 ENCOUNTER — Encounter: Admitting: Obstetrics and Gynecology

## 2024-10-25 ENCOUNTER — Other Ambulatory Visit

## 2024-10-25 ENCOUNTER — Ambulatory Visit
